# Patient Record
Sex: Female | Born: 1940 | Race: Black or African American | Hispanic: No | Smoking: Never smoker
Health system: Southern US, Community
[De-identification: ages and names within clinical notes are randomized; demographics above are authoritative.]

## PROBLEM LIST (undated history)

## (undated) DIAGNOSIS — E785 Hyperlipidemia, unspecified: Secondary | ICD-10-CM

## (undated) DIAGNOSIS — M1990 Unspecified osteoarthritis, unspecified site: Secondary | ICD-10-CM

## (undated) DIAGNOSIS — T7840XA Allergy, unspecified, initial encounter: Secondary | ICD-10-CM

## (undated) DIAGNOSIS — I10 Essential (primary) hypertension: Secondary | ICD-10-CM

## (undated) HISTORY — DX: Hyperlipidemia, unspecified: E78.5

## (undated) HISTORY — DX: Allergy, unspecified, initial encounter: T78.40XA

## (undated) HISTORY — PX: ABDOMINAL HYSTERECTOMY: SHX81

## (undated) HISTORY — PX: BREAST SURGERY: SHX581

## (undated) HISTORY — PX: EYE SURGERY: SHX253

## (undated) HISTORY — DX: Unspecified osteoarthritis, unspecified site: M19.90

## (undated) HISTORY — PX: BREAST BIOPSY: SHX20

---

## 2015-09-26 ENCOUNTER — Ambulatory Visit (HOSPITAL_BASED_OUTPATIENT_CLINIC_OR_DEPARTMENT_OTHER)
Admission: RE | Admit: 2015-09-26 | Discharge: 2015-09-26 | Disposition: A | Discharge status: Home / Self Care | Payer: Medicare PPO | Source: Ambulatory Visit | Attending: Osteopathic Medicine | Admitting: Osteopathic Medicine

## 2015-09-26 ENCOUNTER — Other Ambulatory Visit (HOSPITAL_BASED_OUTPATIENT_CLINIC_OR_DEPARTMENT_OTHER): Payer: Self-pay | Admitting: Osteopathic Medicine

## 2015-09-26 DIAGNOSIS — M79605 Pain in left leg: Secondary | ICD-10-CM | POA: Diagnosis not present

## 2015-09-26 DIAGNOSIS — I82402 Acute embolism and thrombosis of unspecified deep veins of left lower extremity: Secondary | ICD-10-CM

## 2018-08-24 ENCOUNTER — Emergency Department (HOSPITAL_BASED_OUTPATIENT_CLINIC_OR_DEPARTMENT_OTHER): Payer: Medicare Other

## 2018-08-24 ENCOUNTER — Emergency Department (HOSPITAL_BASED_OUTPATIENT_CLINIC_OR_DEPARTMENT_OTHER)
Admission: EM | Admit: 2018-08-24 | Discharge: 2018-08-24 | Disposition: A | Discharge status: Home / Self Care | Payer: Medicare Other | Attending: Emergency Medicine | Admitting: Emergency Medicine

## 2018-08-24 ENCOUNTER — Encounter (HOSPITAL_BASED_OUTPATIENT_CLINIC_OR_DEPARTMENT_OTHER): Payer: Self-pay | Admitting: Emergency Medicine

## 2018-08-24 ENCOUNTER — Other Ambulatory Visit: Payer: Self-pay

## 2018-08-24 DIAGNOSIS — M1712 Unilateral primary osteoarthritis, left knee: Secondary | ICD-10-CM | POA: Insufficient documentation

## 2018-08-24 DIAGNOSIS — Z79899 Other long term (current) drug therapy: Secondary | ICD-10-CM | POA: Insufficient documentation

## 2018-08-24 DIAGNOSIS — R1013 Epigastric pain: Secondary | ICD-10-CM | POA: Diagnosis present

## 2018-08-24 DIAGNOSIS — I10 Essential (primary) hypertension: Secondary | ICD-10-CM | POA: Diagnosis not present

## 2018-08-24 HISTORY — DX: Essential (primary) hypertension: I10

## 2018-08-24 LAB — COMPREHENSIVE METABOLIC PANEL
ALT: 17 U/L (ref 0–44)
AST: 16 U/L (ref 15–41)
Albumin: 4 g/dL (ref 3.5–5.0)
Alkaline Phosphatase: 49 U/L (ref 38–126)
Anion gap: 12 (ref 5–15)
BUN: 19 mg/dL (ref 8–23)
CALCIUM: 8.9 mg/dL (ref 8.9–10.3)
CO2: 27 mmol/L (ref 22–32)
CREATININE: 0.89 mg/dL (ref 0.44–1.00)
Chloride: 100 mmol/L (ref 98–111)
GFR calc non Af Amer: >60 mL/min (ref >60)
Glucose, Bld: 94 mg/dL (ref 70–99)
Potassium: 3.1 mmol/L — ABNORMAL LOW (ref 3.5–5.1)
Sodium: 139 mmol/L (ref 135–145)
Total Bilirubin: 0.7 mg/dL (ref 0.3–1.2)
Total Protein: 7.4 g/dL (ref 6.5–8.1)

## 2018-08-24 LAB — URINALYSIS, ROUTINE W REFLEX MICROSCOPIC
BILIRUBIN URINE: NEGATIVE
GLUCOSE, UA: NEGATIVE mg/dL
Ketones, ur: NEGATIVE mg/dL
Nitrite: POSITIVE — AB
PROTEIN: NEGATIVE mg/dL
Specific Gravity, Urine: 1.025 (ref 1.005–1.030)
pH: 5.5 (ref 5.0–8.0)

## 2018-08-24 LAB — CBC WITH DIFFERENTIAL/PLATELET
Basophils Absolute: 0 10*3/uL (ref 0.0–0.1)
Basophils Relative: 0 %
EOS PCT: 1 %
Eosinophils Absolute: 0.1 10*3/uL (ref 0.0–0.7)
HCT: 41 % (ref 36.0–46.0)
Hemoglobin: 14 g/dL (ref 12.0–15.0)
Lymphocytes Relative: 15 %
Lymphs Abs: 0.9 10*3/uL (ref 0.7–4.0)
MCH: 30.2 pg (ref 26.0–34.0)
MCHC: 34.1 g/dL (ref 30.0–36.0)
MCV: 88.4 fL (ref 78.0–100.0)
MONOS PCT: 7 %
Monocytes Absolute: 0.5 10*3/uL (ref 0.1–1.0)
Neutro Abs: 4.7 10*3/uL (ref 1.7–7.7)
Neutrophils Relative %: 77 %
PLATELETS: 234 10*3/uL (ref 150–400)
RBC: 4.64 MIL/uL (ref 3.87–5.11)
RDW: 13.5 % (ref 11.5–15.5)
WBC: 6.1 10*3/uL (ref 4.0–10.5)

## 2018-08-24 LAB — TROPONIN I: Troponin I: <0.03 ng/mL (ref <0.03)

## 2018-08-24 LAB — URINALYSIS, MICROSCOPIC (REFLEX)

## 2018-08-24 LAB — LIPASE, BLOOD: LIPASE: 26 U/L (ref 11–51)

## 2018-08-24 MED ORDER — SODIUM CHLORIDE 0.9 % IV SOLN: INTRAVENOUS | Status: DC | PRN

## 2018-08-24 MED ORDER — OMEPRAZOLE 20 MG PO CPDR: 20.0000 mg | DELAYED_RELEASE_CAPSULE | Freq: Every day | ORAL | 0 refills | Status: DC

## 2018-08-24 MED ORDER — ACETAMINOPHEN ER 650 MG PO TBCR: 650.0000 mg | EXTENDED_RELEASE_TABLET | Freq: Three times a day (TID) | ORAL | 0 refills | Status: DC

## 2018-08-24 MED ORDER — IOPAMIDOL (ISOVUE-300) INJECTION 61%
100.0000 mL | Freq: Once | INTRAVENOUS | Status: AC | PRN
  Administered 2018-08-24: 100 mL via INTRAVENOUS

## 2018-08-24 MED ORDER — FAMOTIDINE IN NACL 20-0.9 MG/50ML-% IV SOLN
20.0000 mg | Freq: Once | INTRAVENOUS | Status: AC
  Administered 2018-08-24: 20 mg via INTRAVENOUS
  Filled 2018-08-24: qty 50

## 2018-08-24 MED ORDER — POTASSIUM CHLORIDE CRYS ER 20 MEQ PO TBCR
40.0000 meq | EXTENDED_RELEASE_TABLET | Freq: Once | ORAL | Status: AC
  Administered 2018-08-24: 40 meq via ORAL
  Filled 2018-08-24: qty 2

## 2018-08-24 MED ORDER — SODIUM CHLORIDE 0.9 % IV SOLN
INTRAVENOUS | Status: DC | PRN
  Administered 2018-08-24: 18:00:00 via INTRAVENOUS

## 2018-08-24 NOTE — ED Provider Notes (Signed)
[facility] EMERGENCY DEPARTMENT Provider Note   CSN: [id number] Arrival date & time: 08/24/18  1710     History   Chief Complaint Chief Complaint  Patient presents with  . Abdominal Pain    HPI Faith Webb is a 77 y.o. female with a past medical history of hypertension presents to ED for intermittent epigastric pain since last night.  States that symptoms began after she ate some fish and coleslaw that was sitting outside in the heat.  She had no improvement with 1 dose of Pepto-Bismol.  No history of similar symptoms in the past.  She denies any chest pain or shortness of breath but states that "I do not know if it is my heart, it could be."  No history of MI or PE in the past.  Patient has history of reflux.  She has had a decreased appetite today.  Denies any nausea, vomiting, changes to bowel movements, fever. She also reports 61-month history of intermittent left knee pain.  States that pain is worse after walking long distances.  Denies any injuries or falls, numbness in legs or leg swelling.  HPI  Past Medical History:  Diagnosis Date  . Hypertension     There are no active problems to display for this patient.   Past Surgical History:  Procedure Laterality Date  . ABDOMINAL HYSTERECTOMY       OB History   None      Home Medications    Prior to Admission medications   Medication Sig Start Date End Date Taking? Authorizing Provider  hydrochlorothiazide (HYDRODIURIL) 25 MG tablet Take 25 mg by mouth daily.   Yes [provider]  POTASSIUM CHLORIDE PO Take by mouth.   Yes [provider]    Family History History reviewed. No pertinent family history.  Social History Social History   Tobacco Use  . Smoking status: Never Smoker  . Smokeless tobacco: Never Used  Substance Use Topics  . Alcohol use: Never    Frequency: Never  . Drug use: Never     Allergies   Patient has no known allergies.   Review of Systems Review  of Systems  Constitutional: Negative for appetite change, chills and fever.  HENT: Negative for ear pain, rhinorrhea, sneezing and sore throat.   Eyes: Negative for photophobia and visual disturbance.  Respiratory: Negative for cough, chest tightness, shortness of breath and wheezing.   Cardiovascular: Negative for chest pain and palpitations.  Gastrointestinal: Positive for abdominal pain. Negative for blood in stool, constipation, diarrhea, nausea and vomiting.  Genitourinary: Negative for dysuria, hematuria and urgency.  Musculoskeletal: Positive for arthralgias. Negative for myalgias.  Skin: Negative for rash.  Neurological: Negative for dizziness, weakness and light-headedness.     Physical Exam Updated Vital Signs BP (!) 109/95   Pulse 87   Temp 99.2 F (37.3 C) (Oral)   Resp (!) 21   Ht 5\' 1"  (1.549 m)   Wt 65.3 kg   SpO2 98%   BMI 27.21 kg/m   Physical Exam  Constitutional: She appears well-developed and well-nourished. No distress.  HENT:  Head: Normocephalic and atraumatic.  Nose: Nose normal.  Eyes: Conjunctivae and EOM are normal. Left eye exhibits no discharge. No scleral icterus.  Neck: Normal range of motion. Neck supple.  Cardiovascular: Normal rate, regular rhythm, normal heart sounds and intact distal pulses. Exam reveals no gallop and no friction rub.  No murmur heard. Pulmonary/Chest: Effort normal and breath sounds normal. No respiratory distress.  Abdominal: Soft. Bowel sounds are normal. She exhibits no distension. There is tenderness in the epigastric area. There is no guarding.  Musculoskeletal: Normal range of motion. She exhibits tenderness. She exhibits no edema.  Tenderness to palpation of the left medial knee with no changes to range of motion noted.  No erythema, edema or warmth of joint noted.  Neurological: She is alert. She exhibits normal muscle tone. Coordination normal.  Skin: Skin is warm and dry. No rash noted.  Psychiatric: She has a  normal mood and affect.  Nursing note and vitals reviewed.    ED Treatments / Results  Labs (all labs ordered are listed, but only abnormal results are displayed) Labs Reviewed  COMPREHENSIVE METABOLIC PANEL - Abnormal; Notable for the following components:      Result Value   Potassium 3.1 (*)    All other components within normal limits  URINALYSIS, ROUTINE W REFLEX MICROSCOPIC - Abnormal; Notable for the following components:   APPearance CLOUDY (*)    Hgb urine dipstick SMALL (*)    Nitrite POSITIVE (*)    Leukocytes, UA TRACE (*)    All other components within normal limits  URINALYSIS, MICROSCOPIC (REFLEX) - Abnormal; Notable for the following components:   Bacteria, UA MANY (*)    All other components within normal limits  URINE CULTURE  LIPASE, BLOOD  CBC WITH DIFFERENTIAL/PLATELET  TROPONIN I    EKG EKG Interpretation  Date/Time:  Sunday August 24 2018 18:13:29 EDT Ventricular Rate:  92 PR Interval:    QRS Duration: 87 QT Interval:  348 QTC Calculation: 431 R Axis:   -7 Text Interpretation:  Sinus rhythm Right atrial enlargement Low voltage, precordial leads No acute changes No old tracing to compare Confirmed by Nanavati, Ankit ([id number]) on 08/24/2018 6:25:14 PM   Radiology Dg Chest 2 View  Result Date: 08/24/2018 CLINICAL DATA:  Epigastric pain EXAM: CHEST - 2 VIEW COMPARISON:  None. FINDINGS: No focal opacity or pleural effusion. Heart size upper limits of normal. No pneumothorax. IMPRESSION: No active cardiopulmonary disease. Electronically Signed   By: Kim  Fujinaga M.D.   On: 08/24/2018 19:23   Dg Knee Complete 4 Views Left  Result Date: 08/24/2018 CLINICAL DATA:  Knee pain for several months EXAM: LEFT KNEE - COMPLETE 4+ VIEW COMPARISON:  None. FINDINGS: No fracture or malalignment. Moderate arthritis involving the medial compartment. Mild patellofemoral degenerative change. No large knee effusion. IMPRESSION: Moderate arthritis of the knee.  No acute  osseous abnormality. Electronically Signed   By: Kim  Fujinaga M.D.   On: 08/24/2018 19:24    Procedures Procedures (including critical care time)  Medications Ordered in ED Medications  0.9 %  sodium chloride infusion ( Intravenous New Bag/Given 08/24/18 1814)  potassium chloride SA (K-DUR,KLOR-CON) CR tablet 40 mEq (has no administration in time range)  famotidine (PEPCID) IVPB 20 mg premix (20 mg Intravenous New Bag/Given 08/24/18 1816)     Initial Impression / Assessment and Plan / ED Course  I have reviewed the triage vital signs and the nursing notes.  Pertinent labs & imaging results that were available during my care of the patient were reviewed by me and considered in my medical decision making (see chart for details).     77  year old female with a past medical history of hypertension presents to ED for intermittent epigastric pain since last night.  Unsure if this is related to the fish and coleslaw that she ate for dinner.  No improvement with Pepto-Bismol.  No history of  similar symptoms in the past.  She denies any nausea, vomiting, shortness of breath, hemoptysis, prior MI or PE.  She also reports ongoing left knee pain for the past several months which is worse with walking long distances.  On exam there is no abdominal tenderness to palpation.  No lower extremity edema, erythema or calf tenderness bilaterally.  No changes to range of motion of the knee or overlying skin changes.  She is not tachycardic, tachypneic or hypoxic.  Lab work significant for mild hypokalemia at 3.1, lipase, CBC, troponin unremarkable.  Urinalysis positive nitrites, leukocytes and bacteria.  Patient denies any urinary symptoms.  EKG shows sinus rhythm with no acute changes.  Chest x-ray is unremarkable.  X-ray of the knee shows osteoarthritis with no other concerning abnormalities. Will obtain CT of the abdomen pelvis and reassess. Care handed off to oncoming provider pending reassessment and  imaging.  Portions of this note were generated with Scientist, clinical (histocompatibility and immunogenetics). Dictation errors may occur despite best attempts at proofreading.   Final Clinical Impressions(s) / ED Diagnoses   Final diagnoses:  None    ED Discharge Orders    None       Dietrich Pates, PA-C 08/24/18 2018    Derwood Kaplan, MD 08/25/18 0004

## 2018-08-24 NOTE — ED Triage Notes (Signed)
Reports intermittent epigastric pain since last night without N/V/D.  Additionally reports left knee pain which has been bothering her for several months and states she would like this to be evaluated today.

## 2018-08-24 NOTE — Discharge Instructions (Addendum)
The x-ray of your knee showed osteoarthritis with no other concerning findings. Lab work today was reassuring.  Chest x-ray showed no concerning findings and the CT scan is also normal. Follow-up with your primary care provider. Return to ED for worsening symptoms, chest pain, shortness of breath, vomiting or coughing up blood, fever, lightheadedness or loss of consciousness.

## 2018-08-28 LAB — URINE CULTURE: Culture: >=100000 — AB

## 2018-08-29 ENCOUNTER — Telehealth: Payer: Self-pay | Admitting: Emergency Medicine

## 2018-08-29 NOTE — Progress Notes (Signed)
ED Antimicrobial Stewardship Positive Culture Follow Up   Faith Webb is an 77 y.o. female who presented to [facility] on 08/24/2018 with a chief complaint of  Chief Complaint  Patient presents with  . Abdominal Pain    Recent Results (from the past 720 hour(s))  Urine culture     Status: Abnormal   Collection Time: 08/24/18  6:10 PM  Result Value Ref Range Status   Specimen Description   Final    URINE, CLEAN CATCH Performed at [facility], [address] [place], [place] [address]    Special Requests   Final    NONE Performed at [facility], [address] [place], [place] [address]    Culture >=100,000 COLONIES/mL ESCHERICHIA COLI (A)  Final   Report Status 08/28/2018 FINAL  Final   Organism ID, Bacteria ESCHERICHIA COLI (A)  Final      Susceptibility   Escherichia coli - MIC*    AMPICILLIN <=2 SENSITIVE Sensitive     CEFAZOLIN <=4 SENSITIVE Sensitive     CEFTRIAXONE <=1 SENSITIVE Sensitive     CIPROFLOXACIN <=0.25 SENSITIVE Sensitive     GENTAMICIN <=1 SENSITIVE Sensitive     IMIPENEM <=0.25 SENSITIVE Sensitive     NITROFURANTOIN <=16 SENSITIVE Sensitive     TRIMETH/SULFA <=20 SENSITIVE Sensitive     AMPICILLIN/SULBACTAM <=2 SENSITIVE Sensitive     PIP/TAZO <=4 SENSITIVE Sensitive     Extended ESBL NEGATIVE Sensitive     * >=100,000 COLONIES/mL ESCHERICHIA COLI    No treatment necessary, clinical presentation consistent with asymptomatic bacteriuria.   ED Provider: Ralph Leyden    Lyanne Co 08/29/2018, 9:42 AM PharmD Candidate Monday - Friday phone -  [phone] Saturday - Sunday phone - [phone]

## 2018-08-29 NOTE — Telephone Encounter (Signed)
Post ED Visit - Positive Culture Follow-up  Culture report reviewed by antimicrobial stewardship pharmacist:  []  Enzo Bi, Pharm.D. []  Celedonio Miyamoto, Pharm.D., BCPS AQ-ID []  Garvin Fila, Pharm.D., BCPS []  Georgina Pillion, [address].D., BCPS []  [place], [address].D., BCPS, AAHIVP []  Estella Husk, Pharm.D., BCPS, AAHIVP []  Lysle Pearl, PharmD, BCPS []  Phillips Climes, PharmD, BCPS []  Agapito Games, PharmD, BCPS [x]  Lyanne Co, PharmD  Positive Urine culture No further patient follow-up is required at this time.  Delvis Kau 08/29/2018, 10:10 AM

## 2019-05-07 ENCOUNTER — Telehealth: Payer: Self-pay

## 2019-05-07 NOTE — Telephone Encounter (Signed)
Copied from [place] [phone]. Topic: Quick Communication - See Telephone Encounter >> May 07, 2019  9:47 AM Loma Boston wrote: CRM for notification. See Telephone encounter for: 05/07/19.PT called in for a new appt with Dr Etter Sjogren, front desk verified her pt panel was full, upon returning to patient she then said that her dr., Dr Liston Alba had called Dr Etter Sjogren and asked her to take  as pt and Lowne stated she would. There are no notes or records in chart to verify this but if pt is to be contacted she can be reached at [phone]. She states that it must be asap.

## 2019-05-07 NOTE — Telephone Encounter (Signed)
I just spoke to her this am--- yes I agreed to it--- she just beat me to you all

## 2019-05-07 NOTE — Telephone Encounter (Signed)
Okay to schedule NP appt w/ Dr. Etter Sjogren.

## 2019-05-08 ENCOUNTER — Other Ambulatory Visit: Payer: Self-pay

## 2019-05-08 ENCOUNTER — Ambulatory Visit (HOSPITAL_BASED_OUTPATIENT_CLINIC_OR_DEPARTMENT_OTHER)
Admission: RE | Admit: 2019-05-08 | Discharge: 2019-05-08 | Disposition: A | Discharge status: Home / Self Care | Payer: Medicare Other | Source: Ambulatory Visit | Attending: Family Medicine | Admitting: Family Medicine

## 2019-05-08 ENCOUNTER — Encounter: Payer: Self-pay | Admitting: Family Medicine

## 2019-05-08 ENCOUNTER — Ambulatory Visit: Payer: Medicare Other | Admitting: Family Medicine

## 2019-05-08 VITALS — BP 128/67 | HR 96 | Temp 98.3°F | Resp 18 | Wt 139.4 lb

## 2019-05-08 DIAGNOSIS — I10 Essential (primary) hypertension: Secondary | ICD-10-CM

## 2019-05-08 DIAGNOSIS — R0602 Shortness of breath: Secondary | ICD-10-CM

## 2019-05-08 DIAGNOSIS — R002 Palpitations: Secondary | ICD-10-CM

## 2019-05-08 DIAGNOSIS — R42 Dizziness and giddiness: Secondary | ICD-10-CM | POA: Diagnosis not present

## 2019-05-08 DIAGNOSIS — E785 Hyperlipidemia, unspecified: Secondary | ICD-10-CM

## 2019-05-08 NOTE — Telephone Encounter (Signed)
NP appt scheduled

## 2019-05-08 NOTE — Progress Notes (Signed)
Patient ID: Faith Webb, female    DOB: 06/04/1941  Age: 78 y.o. MRN: [id number]    Subjective:  Subjective  HPI Faith Webb presents to establish care    She has been having trouble with weakness , dizziness and palpitations.  She recently had a holter but does not know the results.  She would like to [facility] all [facility] care to [facility].   No chest pain.  Some sob with exertion.   Review of Systems  Constitutional: Negative for appetite change, diaphoresis, fatigue and unexpected weight change.  Eyes: Negative for pain, redness and visual disturbance.  Respiratory: Positive for shortness of breath. Negative for cough, chest tightness and wheezing.   Cardiovascular: Positive for palpitations. Negative for chest pain and leg swelling.  Endocrine: Negative for cold intolerance, heat intolerance, polydipsia, polyphagia and polyuria.  Genitourinary: Negative for difficulty urinating, dysuria and frequency.  Neurological: Positive for dizziness, weakness and light-headedness. Negative for numbness and headaches.    History Past Medical History:  Diagnosis Date  . Allergy   . Arthritis   . Hyperlipidemia   . Hypertension     She has a past surgical history that includes Abdominal hysterectomy.   Her family history includes Cancer in her sister; Diabetes in her brother, father, mother, sister, sister, and sister; Heart disease in her mother; Hyperlipidemia in her brother, sister, and sister; Hypertension in her mother and sister; Kidney disease in her sister; Stroke in her brother, mother, and sister.She reports that she has never smoked. She has never used smokeless tobacco. She reports that she does not drink alcohol or use drugs.  Current Outpatient Medications on File Prior to Visit  Medication Sig Dispense Refill  . hydrochlorothiazide (HYDRODIURIL) 25 MG tablet Take 25 mg by mouth daily.    . hydrOXYzine (VISTARIL) 25 MG capsule Take 25 mg by mouth at bedtime.    . metoprolol tartrate  (LOPRESSOR) 25 MG tablet Take 25 mg by mouth 2 (two) times daily.    Marland Kitchen POTASSIUM CHLORIDE PO Take by mouth.    Marland Kitchen acetaminophen (TYLENOL 8 HOUR) 650 MG CR tablet Take 1 tablet (650 mg total) by mouth every 8 (eight) hours. (Patient not taking: Reported on 05/08/2019) 30 tablet 0  . omeprazole (PRILOSEC) 20 MG capsule Take 1 capsule (20 mg total) by mouth daily. (Patient not taking: Reported on 05/08/2019) 30 capsule 0   No current facility-administered medications on file prior to visit.      Objective:  Objective  Physical Exam Vitals signs and nursing note reviewed.  Constitutional:      Appearance: She is well-developed.  HENT:     Head: Normocephalic and atraumatic.  Eyes:     Conjunctiva/sclera: Conjunctivae normal.  Neck:     Musculoskeletal: Normal range of motion and neck supple.     Thyroid: No thyromegaly.     Vascular: No carotid bruit or JVD.  Cardiovascular:     Rate and Rhythm: Normal rate and regular rhythm.     Heart sounds: Normal heart sounds. No murmur.  Pulmonary:     Effort: Pulmonary effort is normal. No respiratory distress.     Breath sounds: Normal breath sounds. No wheezing or rales.  Chest:     Chest wall: No tenderness.  Neurological:     General: No focal deficit present.     Mental Status: She is alert and oriented to person, place, and time.     Motor: No weakness.     Gait: Gait  normal.  Psychiatric:        Mood and Affect: Mood normal.        Behavior: Behavior normal.        Thought Content: Thought content normal.        Judgment: Judgment normal.    BP 128/67 (BP Location: Left Arm, Patient Position: Sitting, Cuff Size: Normal)   Pulse 96   Temp 98.3 F (36.8 C) (Oral)   Resp 18   Wt 139 lb 6.4 oz (63.2 kg)   SpO2 99%   BMI 26.34 kg/m  Wt Readings from Last 3 Encounters:  05/08/19 139 lb 6.4 oz (63.2 kg)  08/24/18 144 lb (65.3 kg)   ekg from ems--  nsr   Lab Results  Component Value Date   WBC 5.4 05/08/2019   HGB 13.8  05/08/2019   HCT 40.0 05/08/2019   PLT 304 05/08/2019   GLUCOSE 106 (H) 05/08/2019   CHOL 212 (H) 05/08/2019   TRIG 140 05/08/2019   HDL 62 05/08/2019   LDLCALC 125 (H) 05/08/2019   ALT 13 05/08/2019   AST 14 05/08/2019   NA 140 05/08/2019   K 3.4 (L) 05/08/2019   CL 100 05/08/2019   CREATININE 0.99 (H) 05/08/2019   BUN 19 05/08/2019   CO2 27 05/08/2019   TSH 0.31 (L) 05/08/2019    Dg Chest 2 View  Result Date: 08/24/2018 CLINICAL DATA:  Epigastric pain EXAM: CHEST - 2 VIEW COMPARISON:  None. FINDINGS: No focal opacity or pleural effusion. Heart size upper limits of normal. No pneumothorax. IMPRESSION: No active cardiopulmonary disease. Electronically Signed   By: Jasmine PangKim  Fujinaga M.D.   On: 08/24/2018 19:23   Ct Abdomen Pelvis W Contrast  Result Date: 08/24/2018 CLINICAL DATA:  Intermittent abdominal pain EXAM: CT ABDOMEN AND PELVIS WITH CONTRAST TECHNIQUE: Multidetector CT imaging of the abdomen and pelvis was performed using the standard protocol following bolus administration of intravenous contrast. CONTRAST:  100mL ISOVUE-300 IOPAMIDOL (ISOVUE-300) INJECTION 61% COMPARISON:  None. FINDINGS: Lower chest: Lung bases are clear. Hepatobiliary: Scattered sub 5 mm cysts. Gallbladder is unremarkable. No intrahepatic or extrahepatic ductal dilatation. Pancreas: Within normal limits normal limits. Spleen: Within Adrenals/Urinary Tract: Adrenal glands are within normal limits. 3.9 cm right upper pole renal cyst. Left kidney is within normal limits. No hydronephrosis. Bladder is within normal limits. Stomach/Bowel: Stomach is within normal limits. No evidence of bowel obstruction. Normal appendix (series 2/image 50). Vascular/Lymphatic: No evidence of abdominal aortic aneurysm. Atherosclerotic calcifications of the abdominal aorta and branch vessels. No suspicious abdominopelvic lymphadenopathy. Reproductive: Status post hysterectomy. Bilateral ovaries are within normal limits. Other: No  abdominopelvic ascites. Tiny fat containing left inguinal hernia. Musculoskeletal: Mild degenerative changes at L4-5. IMPRESSION: No evidence of bowel obstruction.  Normal appendix. 3.9 cm right upper pole renal cyst, benign (Bosniak I). Status post hysterectomy. No CT findings to account for the patient's intermittent abdominal pain. Electronically Signed   By: Charline BillsSriyesh  Krishnan M.D.   On: 08/24/2018 21:10   Dg Knee Complete 4 Views Left  Result Date: 08/24/2018 CLINICAL DATA:  Knee pain for several months EXAM: LEFT KNEE - COMPLETE 4+ VIEW COMPARISON:  None. FINDINGS: No fracture or malalignment. Moderate arthritis involving the medial compartment. Mild patellofemoral degenerative change. No large knee effusion. IMPRESSION: Moderate arthritis of the knee.  No acute osseous abnormality. Electronically Signed   By: Jasmine PangKim  Fujinaga M.D.   On: 08/24/2018 19:24     Assessment & Plan:  Plan  I am having Stacy L.  Mccoy maintain her hydrochlorothiazide, POTASSIUM CHLORIDE PO, omeprazole, acetaminophen, metoprolol tartrate, and hydrOXYzine.  No orders of the defined types were placed in this encounter.   Problem List Items Addressed This Visit      Unprioritized   Dizziness    Check labs Need records from previous pcp and holter montior      Relevant Orders   Vitamin B12 (Completed)   Vitamin D 1,25 dihydroxy   TSH (Completed)   CBC with Differential/Platelet (Completed)   Lipid panel (Completed)   Comprehensive metabolic panel (Completed)   Ambulatory referral to Cardiology   Hyperlipidemia   Relevant Medications   metoprolol tartrate (LOPRESSOR) 25 MG tablet   Hypertension   Relevant Medications   metoprolol tartrate (LOPRESSOR) 25 MG tablet   Palpitations - Primary    Check labs Will get records from previous pcp Refer to cardiology      Relevant Orders   ECHOCARDIOGRAM COMPLETE   Vitamin B12 (Completed)   Vitamin D 1,25 dihydroxy   TSH (Completed)   CBC with  Differential/Platelet (Completed)   Lipid panel (Completed)   Comprehensive metabolic panel (Completed)   DG Chest 2 View (Completed)   Ambulatory referral to Cardiology    Other Visit Diagnoses    SOB (shortness of breath)       Relevant Orders   DG Chest 2 View (Completed)   Ambulatory referral to Cardiology      Follow-up: Return in about 3 months (around 08/08/2019).  Donato SchultzYvonne R Lowne Chase, DO

## 2019-05-09 ENCOUNTER — Other Ambulatory Visit: Payer: Self-pay | Admitting: Family Medicine

## 2019-05-09 DIAGNOSIS — R42 Dizziness and giddiness: Secondary | ICD-10-CM | POA: Insufficient documentation

## 2019-05-09 DIAGNOSIS — R002 Palpitations: Secondary | ICD-10-CM | POA: Insufficient documentation

## 2019-05-09 DIAGNOSIS — E785 Hyperlipidemia, unspecified: Secondary | ICD-10-CM | POA: Insufficient documentation

## 2019-05-09 DIAGNOSIS — E0590 Thyrotoxicosis, unspecified without thyrotoxic crisis or storm: Secondary | ICD-10-CM

## 2019-05-09 DIAGNOSIS — I10 Essential (primary) hypertension: Secondary | ICD-10-CM | POA: Insufficient documentation

## 2019-05-09 NOTE — Assessment & Plan Note (Signed)
Check labs Will get records from previous pcp Refer to cardiology

## 2019-05-09 NOTE — Assessment & Plan Note (Signed)
Check labs Need records from previous pcp and holter montior

## 2019-05-11 ENCOUNTER — Emergency Department (HOSPITAL_BASED_OUTPATIENT_CLINIC_OR_DEPARTMENT_OTHER)
Admission: EM | Admit: 2019-05-11 | Discharge: 2019-05-11 | Disposition: A | Discharge status: Home / Self Care | Payer: Medicare Other | Attending: Emergency Medicine | Admitting: Emergency Medicine

## 2019-05-11 ENCOUNTER — Other Ambulatory Visit: Payer: Self-pay

## 2019-05-11 ENCOUNTER — Encounter (HOSPITAL_BASED_OUTPATIENT_CLINIC_OR_DEPARTMENT_OTHER): Payer: Self-pay | Admitting: Emergency Medicine

## 2019-05-11 DIAGNOSIS — R1013 Epigastric pain: Secondary | ICD-10-CM

## 2019-05-11 DIAGNOSIS — I10 Essential (primary) hypertension: Secondary | ICD-10-CM | POA: Diagnosis not present

## 2019-05-11 DIAGNOSIS — R42 Dizziness and giddiness: Secondary | ICD-10-CM

## 2019-05-11 DIAGNOSIS — Z79899 Other long term (current) drug therapy: Secondary | ICD-10-CM | POA: Diagnosis not present

## 2019-05-11 DIAGNOSIS — R5383 Other fatigue: Secondary | ICD-10-CM | POA: Diagnosis not present

## 2019-05-11 LAB — CBC WITH DIFFERENTIAL/PLATELET
Absolute Monocytes: 589 cells/uL (ref 200–950)
Basophils Absolute: 49 cells/uL (ref 0–200)
Basophils Relative: 0.9 %
Eosinophils Absolute: 81 cells/uL (ref 15–500)
Eosinophils Relative: 1.5 %
HCT: 40 % (ref 35.0–45.0)
Hemoglobin: 13.8 g/dL (ref 11.7–15.5)
Lymphs Abs: 2090 cells/uL (ref 850–3900)
MCH: 30.1 pg (ref 27.0–33.0)
MCHC: 34.5 g/dL (ref 32.0–36.0)
MCV: 87.3 fL (ref 80.0–100.0)
MPV: 10.3 fL (ref 7.5–12.5)
Monocytes Relative: 10.9 %
Neutro Abs: 2592 cells/uL (ref 1500–7800)
Neutrophils Relative %: 48 %
Platelets: 304 10*3/uL (ref 140–400)
RBC: 4.58 10*6/uL (ref 3.80–5.10)
RDW: 13.6 % (ref 11.0–15.0)
Total Lymphocyte: 38.7 %
WBC: 5.4 10*3/uL (ref 3.8–10.8)

## 2019-05-11 LAB — LIPID PANEL
Cholesterol: 212 mg/dL — ABNORMAL HIGH (ref <200)
HDL: 62 mg/dL (ref >=50)
LDL Cholesterol (Calc): 125 mg/dL (calc) — ABNORMAL HIGH
Non-HDL Cholesterol (Calc): 150 mg/dL (calc) — ABNORMAL HIGH (ref <130)
Total CHOL/HDL Ratio: 3.4 (calc) (ref <5.0)
Triglycerides: 140 mg/dL (ref <150)

## 2019-05-11 LAB — URINALYSIS, ROUTINE W REFLEX MICROSCOPIC
Bilirubin Urine: NEGATIVE
Glucose, UA: NEGATIVE mg/dL
Ketones, ur: NEGATIVE mg/dL
Leukocytes,Ua: NEGATIVE
Nitrite: NEGATIVE
Protein, ur: NEGATIVE mg/dL
Specific Gravity, Urine: <1.005 — ABNORMAL LOW (ref 1.005–1.030)
pH: 6 (ref 5.0–8.0)

## 2019-05-11 LAB — VITAMIN B12: Vitamin B-12: 799 pg/mL (ref 200–1100)

## 2019-05-11 LAB — COMPREHENSIVE METABOLIC PANEL
AG Ratio: 1.7 (calc) (ref 1.0–2.5)
ALT: 13 U/L (ref 6–29)
AST: 14 U/L (ref 10–35)
Albumin: 4.4 g/dL (ref 3.6–5.1)
Alkaline phosphatase (APISO): 49 U/L (ref 37–153)
BUN/Creatinine Ratio: 19 (calc) (ref 6–22)
BUN: 19 mg/dL (ref 7–25)
CO2: 27 mmol/L (ref 20–32)
Calcium: 9.7 mg/dL (ref 8.6–10.4)
Chloride: 100 mmol/L (ref 98–110)
Creat: 0.99 mg/dL — ABNORMAL HIGH (ref 0.60–0.93)
Globulin: 2.6 g/dL (calc) (ref 1.9–3.7)
Glucose, Bld: 106 mg/dL — ABNORMAL HIGH (ref 65–99)
Potassium: 3.4 mmol/L — ABNORMAL LOW (ref 3.5–5.3)
Sodium: 140 mmol/L (ref 135–146)
Total Bilirubin: 0.4 mg/dL (ref 0.2–1.2)
Total Protein: 7 g/dL (ref 6.1–8.1)

## 2019-05-11 LAB — VITAMIN D 1,25 DIHYDROXY
Vitamin D 1, 25 (OH)2 Total: 49 pg/mL (ref 18–72)
Vitamin D2 1, 25 (OH)2: 39 pg/mL
Vitamin D3 1, 25 (OH)2: 10 pg/mL

## 2019-05-11 LAB — URINALYSIS, MICROSCOPIC (REFLEX): WBC, UA: NONE SEEN WBC/hpf (ref 0–5)

## 2019-05-11 LAB — TSH: TSH: 0.31 mIU/L — ABNORMAL LOW (ref 0.40–4.50)

## 2019-05-11 MED ORDER — PANTOPRAZOLE SODIUM 40 MG PO TBEC
40.0000 mg | DELAYED_RELEASE_TABLET | Freq: Every day | ORAL | Status: DC
  Administered 2019-05-11: 40 mg via ORAL
  Filled 2019-05-11: qty 1

## 2019-05-11 MED ORDER — OMEPRAZOLE 20 MG PO CPDR: 20.0000 mg | DELAYED_RELEASE_CAPSULE | Freq: Every day | ORAL | 0 refills | Status: DC

## 2019-05-11 MED ORDER — MECLIZINE HCL 12.5 MG PO TABS: 12.5000 mg | ORAL_TABLET | Freq: Three times a day (TID) | ORAL | 0 refills | Status: DC | PRN

## 2019-05-11 NOTE — ED Notes (Signed)
Pt. Daughter is Robinette Haines  Phone is [phone]

## 2019-05-11 NOTE — ED Notes (Signed)
ED Provider at bedside discussing dispo plan of care. 

## 2019-05-11 NOTE — ED Provider Notes (Signed)
[facility] EMERGENCY DEPARTMENT Provider Note   CSN: [id number] Arrival date & time: 05/11/19  1347    History   Chief Complaint Chief Complaint  Patient presents with  . Dizziness and fatigue    HPI Faith Webb is a 78 y.o. female.     HPI Patient reports she has had symptoms for about 4 months.  Reports she is seen several different providers but does not feel like her symptoms are improving.  Reports out of all evaluation, she has never been giving any medication to try to relieve symptoms.  Patient reports one symptom is dizziness.  She reports she will feel lightheaded and fatigued.  She reports the symptoms are often worse in the morning.  Symptoms may wax and wane.  They do seem to be worse with movements and position changes.  He denies that she has a headache or any visual changes.  No weakness numbness or tingling of extremities.  This perception of dizziness does not have any associated chest pain or shortness of breath.  She reports that she also for a number of months has felt she has some epigastric discomfort.  It is a aching pressure discomfort.  Sometimes worse with eating.  No vomiting or diarrhea.  No pain burning urgency with urination.  Patient has been seen by Dr. Laury AxonLowne this week and had lab studies done and has been referred for echo and cardiology follow-up.  Recommendation Dr. Ernst SpellLowne's note indicates she is to continue her omeprazole.  Patient reports she is not taking omeprazole and that it was not suggested to her that she should take it.  The best of what I can discern by the conversation with the patient she is not taking any PPI or acid medication.  She reports she is also having a lot of trouble sleeping at night.  She reports she feels very restless and cannot sleep. Past Medical History:  Diagnosis Date  . Allergy   . Arthritis   . Hyperlipidemia   . Hypertension     Patient Active Problem List   Diagnosis Date Noted  . Palpitations  05/09/2019  . Dizziness 05/09/2019  . Hypertension   . Hyperlipidemia     Past Surgical History:  Procedure Laterality Date  . ABDOMINAL HYSTERECTOMY    . BREAST SURGERY       OB History   No obstetric history on file.      Home Medications    Prior to Admission medications   Medication Sig Start Date End Date Taking? Authorizing Provider  Vitamin D, Ergocalciferol, (DRISDOL) 1.25 MG (50000 UT) CAPS capsule TAKE 1 CAPSULE BY MOUTH EVERY OTHER WEEK 07/02/17  Yes [provider]  hydrochlorothiazide (HYDRODIURIL) 25 MG tablet Take 25 mg by mouth daily.    [provider]  hydrOXYzine (VISTARIL) 25 MG capsule Take 25 mg by mouth at bedtime.    [provider]  Magnesium Oxide 420 MG TABS Take by mouth.    [provider]  meclizine (ANTIVERT) 12.5 MG tablet Take 1 tablet (12.5 mg total) by mouth 3 (three) times daily as needed for dizziness. 05/11/19   Arby BarrettePfeiffer, Rhiley Tarver, MD  metoprolol tartrate (LOPRESSOR) 25 MG tablet Take 25 mg by mouth 2 (two) times daily.    [provider]  Omega-3 Fatty Acids (THE VERY FINEST FISH OIL) LIQD Take by mouth.    [provider]  omeprazole (PRILOSEC) 20 MG capsule Take 1 capsule (20 mg total) by mouth daily. 05/11/19  Charlesetta Shanks, MD  POTASSIUM CHLORIDE PO Take by mouth.    [provider]    Family History Family History  Problem Relation Age of Onset  . Diabetes Mother   . Heart disease Mother   . Hypertension Mother   . Stroke Mother   . Diabetes Father   . Cancer Sister   . Diabetes Sister   . Diabetes Brother   . Hyperlipidemia Brother   . Stroke Brother   . Diabetes Sister   . Hyperlipidemia Sister   . Hypertension Sister   . Kidney disease Sister   . Stroke Sister   . Diabetes Sister   . Hyperlipidemia Sister     Social History Social History   Tobacco Use  . Smoking status: Never Smoker  . Smokeless tobacco: Never Used  Substance Use Topics  . Alcohol  use: Never    Frequency: Never  . Drug use: Never     Allergies   Patient has no known allergies.   Review of Systems Review of Systems 10 Systems reviewed and are negative for acute change except as noted in the HPI.   Physical Exam Updated Vital Signs BP (!) 159/66 (BP Location: Right Arm)   Pulse 78   Temp 98.6 F (37 C) (Oral)   Resp 17   Ht 5' 1.5" (1.562 m)   Wt 60.8 kg   SpO2 100%   BMI 24.91 kg/m   Physical Exam Constitutional:      Appearance: She is well-developed.  HENT:     Head: Normocephalic and atraumatic.     Right Ear: Tympanic membrane normal.     Left Ear: Tympanic membrane normal.     Mouth/Throat:     Mouth: Mucous membranes are moist.     Pharynx: Oropharynx is clear.  Eyes:     Extraocular Movements: Extraocular movements intact.     Pupils: Pupils are equal, round, and reactive to light.     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  (She reports this has been treated and was due to be retreated just when the coronavirus broke out and had to be delayed.)  Neck:     Musculoskeletal: Neck supple.  Cardiovascular:     Rate and Rhythm: Normal rate and regular rhythm.     Heart sounds: Normal heart sounds.  Pulmonary:     Effort: Pulmonary effort is normal.     Breath sounds: Normal breath sounds.  Abdominal:     General: Bowel sounds are normal. There is no distension.     Palpations: Abdomen is soft.     Tenderness: There is no abdominal tenderness.  Musculoskeletal: Normal range of motion.        General: No swelling or tenderness.     Right lower leg: No edema.     Left lower leg: No edema.  Skin:    General: Skin is warm and dry.  Neurological:     General: No focal deficit present.     Mental Status: She is alert and oriented to person, place, and time.     GCS: GCS eye subscore is 4. GCS verbal subscore is 5. GCS motor subscore is 6.     Cranial Nerves: No  cranial nerve deficit.     Sensory: No sensory deficit.     Motor: No weakness.     Coordination: Coordination normal.  Psychiatric:  Mood and Affect: Mood normal.      ED Treatments / Results  Labs (all labs ordered are listed, but only abnormal results are displayed) Labs Reviewed  URINALYSIS, ROUTINE W REFLEX MICROSCOPIC - Abnormal; Notable for the following components:      Result Value   Specific Gravity, Urine <1.005 (*)    Hgb urine dipstick TRACE (*)    All other components within normal limits  URINALYSIS, MICROSCOPIC (REFLEX) - Abnormal; Notable for the following components:   Bacteria, UA RARE (*)    All other components within normal limits    EKG EKG Interpretation  Date/Time:  Monday May 11 2019 15:41:04 EDT Ventricular Rate:  72 PR Interval:    QRS Duration: 88 QT Interval:  385 QTC Calculation: 422 R Axis:   36 Text Interpretation:  Sinus rhythm Consider left atrial enlargement normal, no change from old Confirmed by Arby BarrettePfeiffer, Rachella Basden [phone]([id number]) on 05/11/2019 3:53:57 PM   Radiology No results found.  Procedures Procedures (including critical care time)  Medications Ordered in ED Medications  pantoprazole (PROTONIX) EC tablet 40 mg (40 mg Oral Given 05/11/19 1618)     Initial Impression / Assessment and Plan / ED Course  I have reviewed the triage vital signs and the nursing notes.  Pertinent labs & imaging results that were available during my care of the patient were reviewed by me and considered in my medical decision making (see chart for details).       Patient is clinically well in appearance.  Vital signs are stable.  She is not significantly hypertensive.  Patient has had over 4 months of symptoms that include a vague sort of dizziness.  This does not have any associated headache, visual change or neurologic dysfunction.  Patient is not hypotensive and I do not suspect orthostatic hypotension.  He perceives it to be most bothersome when  she gets out of bed in the morning but it can happen anytime during the day.  There is not any associated chest pain, shortness of breath palpitation or near syncope.  Patient's EKG is normal.  Rhythm has been normal.  She is referred for echo and cardiology follow-up.  I do not feel that other ED work-up is indicated at this time.  Nothing has acutely changed in the past 4 months.  There certainly is an element of this to suggest vertigo.  I will have patient trial low dose of Antivert.  We discussed starting with a low dose and discontinuing if any adverse side effects and the fact that she could conceivably increase the dose if she felt some benefit.  She is to follow-up with her PCP to review this.  Her PCP wanted her to be taking omeprazole.  We discussed omeprazole/prilosec.  She did not recognize this is a medication that she is actively taking.  She says she is not.  He denies she is taking any medication that is prescribed for her stomach.  She is to start this it was in her physician's plan as well.  She is counseled to follow-up this week to review her results and response to treatment.  Turn precautions reviewed.  Final Clinical Impressions(s) / ED Diagnoses   Final diagnoses:  Dizzy  Epigastric pain    ED Discharge Orders         Ordered    omeprazole (PRILOSEC) 20 MG capsule  Daily     05/11/19 1657    meclizine (ANTIVERT) 12.5 MG tablet  3 times daily PRN  05/11/19 1657           Arby BarrettePfeiffer, Shanice Poznanski, MD 05/11/19 1712

## 2019-05-11 NOTE — ED Notes (Signed)
Spoke to pt. Daughter and gave update.

## 2019-05-11 NOTE — ED Notes (Signed)
Pt. Has worn cardiac monitor for 24 hours per Pt.

## 2019-05-11 NOTE — ED Triage Notes (Signed)
Pt presents today for Dizziness, fatigue, and epigastric discomfort that has been going on intermittently for over 2 weeks.  Has been seen by primary last week and they have "ordered tests".  Had CXR and blood work done 3 days ago.  Has upcoming echo.  No new sx today.

## 2019-05-11 NOTE — Discharge Instructions (Signed)
1.  Take omeprazole once daily.  Follow dietary instructions for reflux disease.  This may help with the pain you are experiencing in your upper abdomen.  Follow-up with your doctor within the next 1 to 2 weeks for reevaluation.  The medications may take 1 to 4 days to start being effective. 2.  For dizziness particularly her morning symptoms try meclizine as prescribed.  May take this medication up to 3 times a day.  The medication is making you feel foggy or off balance, discontinue the medication. 4.  If you have worsening or concerning symptoms return to the emergency department.

## 2019-05-13 ENCOUNTER — Other Ambulatory Visit: Payer: Self-pay

## 2019-05-13 ENCOUNTER — Telehealth: Payer: Self-pay

## 2019-05-13 MED ORDER — ROSUVASTATIN CALCIUM 10 MG PO TABS: 10.0000 mg | ORAL_TABLET | Freq: Every day | ORAL | 2 refills | Status: DC

## 2019-05-13 NOTE — Telephone Encounter (Signed)
Pt called and was put on two medications at the hospital and wants to make sure medications and then new Crestor doesn't conflict. Medications are Crestor, Omeprazole and Meclizine. Please advise.

## 2019-05-13 NOTE — Progress Notes (Signed)
Released to Mychart

## 2019-05-14 NOTE — Telephone Encounter (Signed)
That is fine 

## 2019-05-15 NOTE — Progress Notes (Signed)
Cardiology Office Note:    Date:  05/18/2019   ID:  Faith ClockMary L Webb, DOB Nov 03, 1941, MRN [id number]  PCP:  Zola ButtonLowne Webb, Grayling CongressYvonne R, DO  Cardiologist:  Norman HerrlichBrian Kassiah Mccrory, MD   Referring MD: Zola ButtonLowne Webb, Grayling CongressYvonne Webb, *  ASSESSMENT:    1. Palpitations   2. Dizziness   3. Essential hypertension   4. Mixed hyperlipidemia    PLAN:    In order of problems listed above:  1. Improved with a beta-blocker.  The monitor she wore showed predominantly sinus tachycardia at this time and continue the beta-blocker if symptoms recur we can evaluate further in the future.  I do not see a real indication for an echocardiogram at this time 2. Resolved 3. Stable blood pressure target continue thiazide diuretic beta-blocker 4. Stable continue her statin  Next appointment as needed   Medication Adjustments/Labs and Tests Ordered: Current medicines are reviewed at length with the patient today.  Concerns regarding medicines are outlined above.  No orders of the defined types were placed in this encounter.  No orders of the defined types were placed in this encounter.    Chief Complaint  Patient presents with  . Palpitations    History of Present Illness:    Faith Webb is a 78 y.o. female with hypertension and hyperlipidemia who is being seen today for the evaluation of palpitation and shortness of breath at the request of Faith Webb, Faith Webb, *.  She was seen in [facility] emergency room 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  She described as forceful intermittent occurred in the early morning hours she actually checked her heart rate and blood pressure during episodes and had no alarm under digital device or real significant change.  She did notice her  hypertension was not at target and since she went on a beta-blocker in the last few weeks palpitation has resolved.  She has no chest pain shortness of breath edema orthopnea syncope and has no exercise intolerance.  She avoids over-the-counter proarrhythmic drugs.  As her blood pressures at target and her symptoms have resolved I advised her to continue on beta-blocker and I do not really see an indication for an echocardiogram at this time.  If the symptoms recur she will contact my office and I will arrange for ambulatory heart rhythm monitor and further evaluation.  Past Medical History:  Diagnosis Date  . Allergy   . Arthritis   . Hyperlipidemia   . Hypertension     Past Surgical History:  Procedure Laterality Date  . ABDOMINAL HYSTERECTOMY    . BREAST SURGERY      Current Medications: Current Meds  Medication Sig  . hydrochlorothiazide (HYDRODIURIL) 25 MG tablet Take 25 mg by mouth daily.  . hydrOXYzine (VISTARIL) 25 MG capsule Take 25 mg by mouth at bedtime.  . Magnesium Oxide 420 MG TABS Take 1 tablet by mouth daily.   . meclizine (ANTIVERT) 12.5 MG tablet Take 1 tablet (12.5 mg total) by mouth 3 (three) times daily as needed for dizziness.  . metoprolol tartrate (LOPRESSOR) 25 MG tablet Take 25 mg by mouth 2 (two) times daily.  . Omega-3 Fatty Acids (THE VERY FINEST FISH OIL) LIQD Take by mouth.  Marland Kitchen. omeprazole (PRILOSEC) 20 MG capsule Take 1 capsule (20 mg total)  by mouth daily.  . potassium chloride (K-DUR) 10 MEQ tablet TAKE 3 TABLETS BY MOUTH TWICE DAILY  . rosuvastatin (CRESTOR) 10 MG tablet Take 1 tablet (10 mg total) by mouth at bedtime.  . Vitamin D, Ergocalciferol, (DRISDOL) 1.25 MG (50000 UT) CAPS capsule TAKE 1 CAPSULE BY MOUTH EVERY OTHER WEEK     Allergies:   Patient has no known allergies.   Social History   Socioeconomic History  . Marital status: Widowed    Spouse name: Not on file  . Number of children: Not on file  . Years of education: Not on file  .  Highest education level: Not on file  Occupational History  . Not on file  Social Needs  . Financial resource strain: Not on file  . Food insecurity    Worry: Not on file    Inability: Not on file  . Transportation needs    Medical: Not on file    Non-medical: Not on file  Tobacco Use  . Smoking status: Never Smoker  . Smokeless tobacco: Never Used  Substance and Sexual Activity  . Alcohol use: Never    Frequency: Never  . Drug use: Never  . Sexual activity: Not on file  Lifestyle  . Physical activity    Days per week: Not on file    Minutes per session: Not on file  . Stress: Not on file  Relationships  . Social Herbalist on phone: Not on file    Gets together: Not on file    Attends religious service: Not on file    Active member of club or organization: Not on file    Attends meetings of clubs or organizations: Not on file    Relationship status: Not on file  Other Topics Concern  . Not on file  Social History Narrative  . Not on file     Family History: The patient's family history includes Cancer in her sister; Diabetes in her brother, father, mother, sister, sister, and sister; Heart disease in her mother; Hyperlipidemia in her brother, sister, and sister; Hypertension in her mother and sister; Kidney disease in her sister; Stroke in her brother, mother, and sister.  ROS:   Review of Systems  Constitution: Negative.  HENT: Negative.   Eyes: Negative.   Cardiovascular: Positive for palpitations.  Respiratory: Negative.   Endocrine: Negative.   Hematologic/Lymphatic: Negative.   Skin: Negative.   Musculoskeletal: Positive for joint pain (left scapula shoulder).  Gastrointestinal: Negative.   Genitourinary: Negative.   Neurological: Positive for dizziness.  Psychiatric/Behavioral: Negative.   Allergic/Immunologic: Negative.    Please see the history of present illness.     All other systems reviewed and are negative.  EKGs/Labs/Other Studies  Reviewed:    The following studies were reviewed today:  Holter Monitor Results Report  Patient Name: Faith FUREY   Date of Birth: 06-12-1941   Age: 78 y.o.  Primary Care Provider: Joya San, MD  Ordering Provider:  Doreatha Lew   Interpreting Physician: Denton Brick, MD [facility]  Indication: palpitations  Baseline Rhythm: sinus rhythm/sinus tachycardia  Rhythm Findings:  1. Ventricular: occasional PVCs  2. Supraventricular: No ectopy, but he does have sinus tachycardia throughout much of the daylight hours  3. Bradyarrhythmias and Pauses: none   4. Symptoms reported:  She reported a variety of symptoms including heart beating fast. During most of the spells normal sinus rhythm or mild sinus tachycardia was seen    CONCLUSIONS:  1. Unremarkable Holter monitor.   2. Mild sinus tachycardia and occasional PVCs as described  3. Symptoms were reported   4. Symptoms were not correlated with any rhythm problems except for mild sinus tachycardia       EKG:  EKG 05/12/2019  personally reviewed and demonstrates [facility] and is normal Chest x-ray 05/08/2019 portable was read as normal Recent Labs: 05/08/2019: ALT 13; BUN 19; Creat 0.99; Hemoglobin 13.8; Platelets 304; Potassium 3.4; Sodium 140; TSH 0.31  Recent Lipid Panel    Component Value Date/Time   CHOL 212 (H) 05/08/2019 1531   TRIG 140 05/08/2019 1531   HDL 62 05/08/2019 1531   CHOLHDL 3.4 05/08/2019 1531   LDLCALC 125 (H) 05/08/2019 1531    Physical Exam:    VS:  BP 134/82 (BP Location: Left Arm, Patient Position: Sitting, Cuff Size: Normal)   Pulse 84   Temp 97.9 F (36.6 C)   Ht 5\' 1"  (1.549 m)   Wt 138 lb 12.8 oz (63 kg)   SpO2 98%   BMI 26.23 kg/m     Wt Readings from Last 3 Encounters:  05/18/19 138 lb 12.8 oz (63 kg)  05/11/19 134 lb (60.8 kg)  05/08/19 139 lb 6.4 oz (63.2 kg)     GEN:  Well nourished, well developed in no acute distress HEENT: Normal NECK: No  JVD; No carotid bruits LYMPHATICS: No lymphadenopathy CARDIAC: RRR, no murmurs, rubs, gallops RESPIRATORY:  Clear to auscultation without rales, wheezing or rhonchi  ABDOMEN: Soft, non-tender, non-distended MUSCULOSKELETAL:  No edema; No deformity  SKIN: Warm and dry NEUROLOGIC:  Alert and oriented x 3 PSYCHIATRIC:  Normal affect     Signed, Norman HerrlichBrian Kaaliyah Kita, MD  05/18/2019 2:35 PM    [facility]

## 2019-05-18 ENCOUNTER — Encounter: Payer: Self-pay | Admitting: Cardiology

## 2019-05-18 ENCOUNTER — Ambulatory Visit (INDEPENDENT_AMBULATORY_CARE_PROVIDER_SITE_OTHER): Payer: Medicare Other | Admitting: Cardiology

## 2019-05-18 ENCOUNTER — Other Ambulatory Visit: Payer: Self-pay

## 2019-05-18 ENCOUNTER — Encounter: Payer: Self-pay | Admitting: *Deleted

## 2019-05-18 VITALS — BP 134/82 | HR 84 | Temp 97.9°F | Ht 61.0 in | Wt 138.8 lb

## 2019-05-18 DIAGNOSIS — I10 Essential (primary) hypertension: Secondary | ICD-10-CM | POA: Diagnosis not present

## 2019-05-18 DIAGNOSIS — R42 Dizziness and giddiness: Secondary | ICD-10-CM

## 2019-05-18 DIAGNOSIS — R002 Palpitations: Secondary | ICD-10-CM | POA: Diagnosis not present

## 2019-05-18 DIAGNOSIS — E782 Mixed hyperlipidemia: Secondary | ICD-10-CM | POA: Diagnosis not present

## 2019-05-18 NOTE — Patient Instructions (Signed)
Medication Instructions:  Your physician recommends that you continue on your current medications as directed. Please refer to the Current Medication list given to you today.  If you need a refill on your cardiac medications before your next appointment, please call your pharmacy.   Lab work: NOne If you have labs (blood work) drawn today and your tests are completely normal, you will receive your results only by: Marland Kitchen MyChart Message (if you have MyChart) OR . A paper copy in the mail If you have any lab test that is abnormal or we need to change your treatment, we will call you to review the results.  Testing/Procedures: None  Follow-Up: At [facility], you and your health needs are our priority.  As part of our continuing mission to provide you with exceptional heart care, we have created designated Provider Care Teams.  These Care Teams include your primary Cardiologist (physician) and Advanced Practice Providers (APPs -  Physician Assistants and Nurse Practitioners) who all work together to provide you with the care you need, when you need it. You will need a follow up appointment as needed Any Other Special Instructions Will Be Listed Below (If Applicable).

## 2019-05-18 NOTE — Telephone Encounter (Signed)
Spoke w/ Pt- informed of below. Pt verbalized understanding.  

## 2019-05-20 ENCOUNTER — Ambulatory Visit: Payer: Self-pay | Admitting: Family Medicine

## 2019-05-20 NOTE — Telephone Encounter (Signed)
I returned pt's call.   She saw Dr. Carollee Herter 2 wks ago for this cough.   It was from one of her BP medications.   She was changed around but she is still coughing  I attempted to warm transfer the call to the office however I could not get my phone to transfer so I'm requesting they call her back. I called the office and they requested I send a note over which I did.     Reason for Disposition . SEVERE coughing spells (e.g., whooping sound after coughing, vomiting after coughing)  Answer Assessment - Initial Assessment Questions 1. ONSET: "When did the cough begin?"      I 've had this cough a long time.   Long before the coronavirus started. 2. SEVERITY: "How bad is the cough today?"      I've not felt better since seeing her 2 wks ago.   I cough at night.  I went to the ED there and they put me a new medication and meclizine for dizziness.   My heart would beat faster.  I saw the cardiologist and he said everything was fine.    3. RESPIRATORY DISTRESS: "Describe your breathing."      No   I developed a pain in my left shoulder I think from the coughing.   It was both shoulders but now just the left. 4. FEVER: "Do you have a fever?" If so, ask: "What is your temperature, how was it measured, and when did it start?"     No 5. SPUTUM: "Describe the color of your sputum" (clear, white, yellow, green)     I'm coughing up clear sputum. 6. HEMOPTYSIS: "Are you coughing up any blood?" If so ask: "How much?" (flecks, streaks, tablespoons, etc.)     No 7. CARDIAC HISTORY: "Do you have any history of heart disease?" (e.g., heart attack, congestive heart failure)      No 8. LUNG HISTORY: "Do you have any history of lung disease?"  (e.g., pulmonary embolus, asthma, emphysema)     No 9. PE RISK FACTORS: "Do you have a history of blood clots?" (or: recent major surgery, recent prolonged travel, bedridden)     No 10. OTHER SYMPTOMS: "Do you have any other symptoms?" (e.g., runny nose, wheezing,  chest pain)       I have to blow my nose after I cough.   11. PREGNANCY: "Is there any chance you are pregnant?" "When was your last menstrual period?"       N/A 12. TRAVEL: "Have you traveled out of the country in the last month?" (e.g., travel history, exposures)       *No Answer*  Protocols used: [place]

## 2019-05-21 ENCOUNTER — Ambulatory Visit (INDEPENDENT_AMBULATORY_CARE_PROVIDER_SITE_OTHER): Payer: Medicare Other | Admitting: Family Medicine

## 2019-05-21 ENCOUNTER — Telehealth: Payer: Self-pay | Admitting: Family Medicine

## 2019-05-21 ENCOUNTER — Telehealth: Payer: Self-pay | Admitting: *Deleted

## 2019-05-21 ENCOUNTER — Other Ambulatory Visit: Payer: Self-pay

## 2019-05-21 ENCOUNTER — Encounter: Payer: Self-pay | Admitting: Family Medicine

## 2019-05-21 VITALS — BP 138/69 | HR 72 | Ht 61.0 in | Wt 138.0 lb

## 2019-05-21 DIAGNOSIS — R05 Cough: Secondary | ICD-10-CM

## 2019-05-21 DIAGNOSIS — Z20822 Contact with and (suspected) exposure to covid-19: Secondary | ICD-10-CM

## 2019-05-21 DIAGNOSIS — R059 Cough, unspecified: Secondary | ICD-10-CM

## 2019-05-21 MED ORDER — AZITHROMYCIN 250 MG PO TABS: ORAL_TABLET | ORAL | 0 refills | Status: DC

## 2019-05-21 MED ORDER — PROMETHAZINE-DM 6.25-15 MG/5ML PO SYRP: 5.0000 mL | ORAL_SOLUTION | Freq: Four times a day (QID) | ORAL | 0 refills | Status: DC | PRN

## 2019-05-21 NOTE — Telephone Encounter (Signed)
Cough x 3-4 months And is worsening

## 2019-05-21 NOTE — Telephone Encounter (Signed)
Pt scheduled for covid testing on 05/22/19 @ 10:45am @ [facility]. Instructions given and order placed.

## 2019-05-21 NOTE — Progress Notes (Signed)
Patient ID: Faith ClockMary L Ciampa, female    DOB: 05-02-1941  Age: 78 y.o. MRN: [id number]    Subjective:  Subjective  HPI Faith Webb presents for c/o cough and congestion x several months.  + productive  No fever   Review of Systems  Constitutional: Negative for appetite change, diaphoresis, fatigue, fever and unexpected weight change.  HENT: Positive for congestion.   Eyes: Negative for pain, redness and visual disturbance.  Respiratory: Positive for cough. Negative for chest tightness, shortness of breath and wheezing.   Cardiovascular: Negative for chest pain, palpitations and leg swelling.  Endocrine: Negative for cold intolerance, heat intolerance, polydipsia, polyphagia and polyuria.  Genitourinary: Negative for difficulty urinating, dysuria and frequency.  Neurological: Negative for dizziness, light-headedness, numbness and headaches.    History Past Medical History:  Diagnosis Date  . Allergy   . Arthritis   . Hyperlipidemia   . Hypertension     She has a past surgical history that includes Abdominal hysterectomy and Breast surgery.   Her family history includes Cancer in her sister; Diabetes in her brother, father, mother, sister, sister, and sister; Heart disease in her mother; Hyperlipidemia in her brother, sister, and sister; Hypertension in her mother and sister; Kidney disease in her sister; Stroke in her brother, mother, and sister.She reports that she has never smoked. She has never used smokeless tobacco. She reports that she does not drink alcohol or use drugs.  Current Outpatient Medications on File Prior to Visit  Medication Sig Dispense Refill  . hydrochlorothiazide (HYDRODIURIL) 25 MG tablet Take 25 mg by mouth daily.    . hydrOXYzine (VISTARIL) 25 MG capsule Take 25 mg by mouth at bedtime.    . Magnesium Oxide 420 MG TABS Take 1 tablet by mouth daily.     . meclizine (ANTIVERT) 12.5 MG tablet Take 1 tablet (12.5 mg total) by mouth 3 (three) times daily as needed  for dizziness. 30 tablet 0  . metoprolol tartrate (LOPRESSOR) 25 MG tablet Take 25 mg by mouth 2 (two) times daily.    . Omega-3 Fatty Acids (THE VERY FINEST FISH OIL) LIQD Take by mouth.    Marland Kitchen. omeprazole (PRILOSEC) 20 MG capsule Take 1 capsule (20 mg total) by mouth daily. 30 capsule 0  . potassium chloride (K-DUR) 10 MEQ tablet TAKE 3 TABLETS BY MOUTH TWICE DAILY    . rosuvastatin (CRESTOR) 10 MG tablet Take 1 tablet (10 mg total) by mouth at bedtime. 30 tablet 2  . Vitamin D, Ergocalciferol, (DRISDOL) 1.25 MG (50000 UT) CAPS capsule TAKE 1 CAPSULE BY MOUTH EVERY OTHER WEEK     No current facility-administered medications on file prior to visit.      Objective:  Objective  Physical Exam Vitals signs and nursing note reviewed.  Constitutional:      Appearance: She is well-developed. She is not diaphoretic.  HENT:     Head: Normocephalic and atraumatic.     Right Ear: Hearing, tympanic membrane, ear canal and external ear normal.     Left Ear: Hearing, tympanic membrane, ear canal and external ear normal.     Nose: Rhinorrhea present.     Right Sinus: No maxillary sinus tenderness or frontal sinus tenderness.     Left Sinus: No maxillary sinus tenderness or frontal sinus tenderness.     Mouth/Throat:     Mouth: Mucous membranes are not pale, not dry and not cyanotic.     Pharynx: Posterior oropharyngeal erythema present. No oropharyngeal exudate.  Cardiovascular:  Rate and Rhythm: Normal rate.  Pulmonary:     Effort: Pulmonary effort is normal.     Breath sounds: Normal breath sounds.    BP 138/69   Pulse 72   Ht 5\' 1"  (1.549 m)   Wt 138 lb (62.6 kg)   BMI 26.07 kg/m  Wt Readings from Last 3 Encounters:  05/21/19 138 lb (62.6 kg)  05/18/19 138 lb 12.8 oz (63 kg)  05/11/19 134 lb (60.8 kg)     Lab Results  Component Value Date   WBC 5.4 05/08/2019   HGB 13.8 05/08/2019   HCT 40.0 05/08/2019   PLT 304 05/08/2019   GLUCOSE 106 (H) 05/08/2019   CHOL 212 (H)  05/08/2019   TRIG 140 05/08/2019   HDL 62 05/08/2019   LDLCALC 125 (H) 05/08/2019   ALT 13 05/08/2019   AST 14 05/08/2019   NA 140 05/08/2019   K 3.4 (L) 05/08/2019   CL 100 05/08/2019   CREATININE 0.99 (H) 05/08/2019   BUN 19 05/08/2019   CO2 27 05/08/2019   TSH 0.31 (L) 05/08/2019    No results found.   Assessment & Plan:  Plan  I am having Shamyah L. Mcgruder start on azithromycin and promethazine-dextromethorphan. I am also having her maintain her hydrochlorothiazide, metoprolol tartrate, hydrOXYzine, The Very Finest Fish Oil, Magnesium Oxide, Vitamin D (Ergocalciferol), omeprazole, meclizine, rosuvastatin, and potassium chloride.  Meds ordered this encounter  Medications  . azithromycin (ZITHROMAX Z-PAK) 250 MG tablet    Sig: As directed    Dispense:  6 each    Refill:  0  . promethazine-dextromethorphan (PROMETHAZINE-DM) 6.25-15 MG/5ML syrup    Sig: Take 5 mLs by mouth 4 (four) times daily as needed.    Dispense:  118 mL    Refill:  0    Problem List Items Addressed This Visit    None    Visit Diagnoses    Cough    -  Primary   Relevant Medications   azithromycin (ZITHROMAX Z-PAK) 250 MG tablet   promethazine-dextromethorphan (PROMETHAZINE-DM) 6.25-15 MG/5ML syrup    covid testing pending  cxr done neg -- last visit   Follow-up: No follow-ups on file.  Ann Held, DO

## 2019-05-22 ENCOUNTER — Other Ambulatory Visit: Payer: Self-pay

## 2019-05-22 DIAGNOSIS — Z20822 Contact with and (suspected) exposure to covid-19: Secondary | ICD-10-CM

## 2019-05-27 LAB — NOVEL CORONAVIRUS, NAA: SARS-CoV-2, NAA: NOT DETECTED

## 2019-05-28 ENCOUNTER — Encounter: Payer: Self-pay | Admitting: Family Medicine

## 2019-05-29 ENCOUNTER — Other Ambulatory Visit: Payer: Self-pay | Admitting: Family Medicine

## 2019-05-29 DIAGNOSIS — R059 Cough, unspecified: Secondary | ICD-10-CM

## 2019-05-29 DIAGNOSIS — R05 Cough: Secondary | ICD-10-CM

## 2019-05-29 NOTE — Telephone Encounter (Signed)
The endo referral was placed --  I will have the referral coordinator check on it The chest xray is ordered ---  You just need to go to radiology on the 1st floor in our building I will be out of the office next week but someone will be covering me if you have any questions or concerns

## 2019-06-01 ENCOUNTER — Ambulatory Visit (HOSPITAL_BASED_OUTPATIENT_CLINIC_OR_DEPARTMENT_OTHER)
Admission: RE | Admit: 2019-06-01 | Discharge: 2019-06-01 | Disposition: A | Discharge status: Home / Self Care | Payer: Medicare Other | Source: Ambulatory Visit | Attending: Family Medicine | Admitting: Family Medicine

## 2019-06-01 ENCOUNTER — Other Ambulatory Visit: Payer: Self-pay

## 2019-06-01 DIAGNOSIS — R059 Cough, unspecified: Secondary | ICD-10-CM

## 2019-06-01 DIAGNOSIS — R05 Cough: Secondary | ICD-10-CM | POA: Insufficient documentation

## 2019-06-04 ENCOUNTER — Other Ambulatory Visit: Payer: Self-pay

## 2019-06-05 ENCOUNTER — Ambulatory Visit: Payer: Medicare Other | Admitting: Internal Medicine

## 2019-06-05 ENCOUNTER — Encounter: Payer: Self-pay | Admitting: Internal Medicine

## 2019-06-05 VITALS — BP 124/70 | HR 72 | Temp 98.0°F | Ht 61.0 in | Wt 140.4 lb

## 2019-06-05 DIAGNOSIS — E0590 Thyrotoxicosis, unspecified without thyrotoxic crisis or storm: Secondary | ICD-10-CM

## 2019-06-05 LAB — TSH: TSH: 0.59 u[IU]/mL (ref 0.35–4.50)

## 2019-06-05 LAB — T4, FREE: Free T4: 0.86 ng/dL (ref 0.60–1.60)

## 2019-06-05 NOTE — Progress Notes (Signed)
Name: Faith Webb  MRN/ DOB: [id number], January 13, 1941    Age/ Sex: 78 y.o., female    PCP: Zola ButtonLowne Chase, Grayling CongressYvonne R, DO   Reason for Endocrinology Evaluation: Low TSH      Date of Initial Endocrinology Evaluation: 06/05/2019     HPI: Ms. Faith Webb is a 78 y.o. female with a past medical history of HTN, Dyslipidemia . The patient presented for initial endocrinology clinic visit on 06/05/2019 for consultative assistance with her low TSH .   She started feeling tired ~ 3 months ago associated with palpitations, dizziness and sob, her symptoms are associated with anxiety . Her TSH was low at 0.321 uIU/mL .   Saw cardiology, cardiac monitor showed sinus tachycardia , currently controlled on Metoprolol.   Her weight has been stable with occasional diarrhea.   No recent  infections   Denies local neck symptoms.  No biotin.  Sister with Hashimoto's disease      HISTORY:  Past Medical History:  Past Medical History:  Diagnosis Date  . Allergy   . Arthritis   . Hyperlipidemia   . Hypertension    Past Surgical History:  Past Surgical History:  Procedure Laterality Date  . ABDOMINAL HYSTERECTOMY    . BREAST SURGERY        Social History:  reports that she has never smoked. She has never used smokeless tobacco. She reports that she does not drink alcohol or use drugs.  Family History: family history includes Cancer in her sister; Diabetes in her brother, father, mother, sister, sister, and sister; Heart disease in her mother; Hyperlipidemia in her brother, sister, and sister; Hypertension in her mother and sister; Kidney disease in her sister; Stroke in her brother, mother, and sister.   HOME MEDICATIONS: Allergies as of 06/05/2019   No Known Allergies     Medication List       Accurate as of June 05, 2019  8:12 AM. If you have any questions, ask your nurse or doctor.        azithromycin 250 MG tablet Commonly known as: Zithromax Z-Pak As directed    hydrochlorothiazide 25 MG tablet Commonly known as: HYDRODIURIL Take 25 mg by mouth daily.   hydrOXYzine 25 MG capsule Commonly known as: VISTARIL Take 25 mg by mouth at bedtime.   Magnesium Oxide 420 MG Tabs Take 1 tablet by mouth daily.   meclizine 12.5 MG tablet Commonly known as: ANTIVERT Take 1 tablet (12.5 mg total) by mouth 3 (three) times daily as needed for dizziness.   metoprolol tartrate 25 MG tablet Commonly known as: LOPRESSOR Take 25 mg by mouth 2 (two) times daily.   omeprazole 20 MG capsule Commonly known as: PRILOSEC Take 1 capsule (20 mg total) by mouth daily.   potassium chloride 10 MEQ tablet Commonly known as: K-DUR TAKE 3 TABLETS BY MOUTH TWICE DAILY   promethazine-dextromethorphan 6.25-15 MG/5ML syrup Commonly known as: PROMETHAZINE-DM Take 5 mLs by mouth 4 (four) times daily as needed.   rosuvastatin 10 MG tablet Commonly known as: Crestor Take 1 tablet (10 mg total) by mouth at bedtime.   The Very Finest Fish Oil Liqd Take by mouth.   Vitamin D (Ergocalciferol) 1.25 MG (50000 UT) Caps capsule Commonly known as: DRISDOL TAKE 1 CAPSULE BY MOUTH EVERY OTHER WEEK         REVIEW OF SYSTEMS: A comprehensive ROS was conducted with the patient and is negative except as per HPI and below:  Review of  Systems  Constitutional: Negative for chills and fever.  HENT: Negative for congestion and sore throat.   Eyes: Positive for pain. Negative for blurred vision.  Respiratory: Positive for shortness of breath. Negative for cough.   Cardiovascular: Positive for palpitations. Negative for chest pain.  Musculoskeletal: Positive for myalgias.  Neurological: Positive for tremors. Negative for tingling.  Psychiatric/Behavioral: Negative for depression. The patient is nervous/anxious.        OBJECTIVE:  VS: BP 124/70 (BP Location: Left Arm, Patient Position: Sitting, Cuff Size: Normal)   Pulse 72   Temp 98 F (36.7 C)   Ht 5\' 1"  (1.549 m)   Wt 140  lb 6.4 oz (63.7 kg)   SpO2 97%   BMI 26.53 kg/m    Wt Readings from Last 3 Encounters:  06/05/19 140 lb 6.4 oz (63.7 kg)  05/21/19 138 lb (62.6 kg)  05/18/19 138 lb 12.8 oz (63 kg)     EXAM: General: Pt appears well and is in NAD  Hydration: Well-hydrated with moist mucous membranes and good skin turgor  Eyes: External eye exam normal without stare, lid lag or exophthalmos.  EOM intact.  PERRL.  Ears, Nose, Throat: Hearing: Grossly intact bilaterally Dental: Good dentition  Throat: Clear without mass, erythema or exudate  Neck: General: Supple without adenopathy. Thyroid: Thyroid size normal.  No goiter or nodules appreciated. No thyroid bruit.  Lungs: Clear with good BS bilat with no rales, rhonchi, or wheezes  Heart: Auscultation: RRR.  Abdomen: Normoactive bowel sounds, soft, nontender, without masses or organomegaly palpable  Extremities:  BL LE: No pretibial edema normal ROM and strength.  Skin: Hair: Texture and amount normal with gender appropriate distribution Skin Inspection: No rashes Skin Palpation: Skin temperature, texture, and thickness normal to palpation  Neuro: Cranial nerves: II - XII grossly intact  Motor: Normal strength throughout DTRs: 2+ and symmetric in UE without delay in relaxation phase  Mental Status: Judgment, insight: Intact Orientation: Oriented to time, place, and person Mood and affect: No depression, anxiety, or agitation     DATA REVIEWED: Results for AYLA, DUNIGAN (MRN [id number]) as of 06/05/2019 13:49  Ref. Range 05/08/2019 15:31 06/05/2019 08:31  TSH Latest Ref Range: 0.35 - 4.50 uIU/mL 0.31 (L) 0.59  T4,Free(Direct) Latest Ref Range: 0.60 - 1.60 ng/dL  0.86    Results for LAMYAH, CREED (MRN [id number]) as of 06/05/2019 08:03  Ref. Range 05/08/2019 15:31  TSH Latest Ref Range: 0.40 - 4.50 mIU/L 0.31 (L)    ASSESSMENT/PLAN/RECOMMENDATIONS:   1. Subclinical Hyperthyroidism   The causes of subclinical hyperthyroidism are  autonomously  functioning thyroid adenomas and multinodular goiters or Graves' disease vs thyroiditis.   Most patients with subclinical hyperthyroidism have no clinical manifestations of hyperthyroidism, and those symptoms that are present (eg, tachycardia, tremor, dyspnea on exertion, weight loss) are mild and nonspecific." However, subclinical hyperthyroidism is associated with an increased risk of atrial fibrillation and, primarily in postmenopausal women, a decrease in bone mineral density.  - Repeat TFT's today are normal. Unclear the cause of her low TSH at the time, this could have been an erroneous result vs recovered thyroiditis.  - Her current symptoms are NOT related to thyroid    Will proceed with repeating lab in 6 weeks to confirm normalization.     F/u in 3 months   Addendum: Discussed lab results with pt on 7/10 at 1350   Signed electronically by: Mack Guise, MD  [facility]  [place]  [address] [place], [place] [address] Phone: [phone] FAX: [phone]   CC: Virgina OrganLowne Chase, [name], DO [address] [place] [address] Phone: [phone] Fax: [phone]   Return to Endocrinology clinic as below: Future Appointments  Date Time Provider Department Center  08/13/2019 10:00 AM [name], [name], DO [facility]

## 2019-06-05 NOTE — Patient Instructions (Signed)
-   We will repeat your thyroid function today, we will also check an antibody to see if you have a hereditary thyroid condition " Graves' Disease) If this is elevated we will start you on a pill.  - If your test is normal , then will have to proceed with a thyroid scan to check for thyroid nodules/cysts

## 2019-06-11 LAB — T3: T3, Total: 101 ng/dL (ref 76–181)

## 2019-06-11 LAB — TRAB (TSH RECEPTOR BINDING ANTIBODY): TRAB: <1 IU/L (ref <=2.00)

## 2019-07-10 ENCOUNTER — Other Ambulatory Visit: Payer: Self-pay | Admitting: Internal Medicine

## 2019-07-10 DIAGNOSIS — E0590 Thyrotoxicosis, unspecified without thyrotoxic crisis or storm: Secondary | ICD-10-CM

## 2019-07-17 ENCOUNTER — Other Ambulatory Visit: Payer: Self-pay

## 2019-07-17 ENCOUNTER — Other Ambulatory Visit (INDEPENDENT_AMBULATORY_CARE_PROVIDER_SITE_OTHER): Payer: Medicare Other

## 2019-07-17 DIAGNOSIS — E0590 Thyrotoxicosis, unspecified without thyrotoxic crisis or storm: Secondary | ICD-10-CM | POA: Diagnosis not present

## 2019-07-17 LAB — TSH: TSH: 0.48 u[IU]/mL (ref 0.35–4.50)

## 2019-07-17 LAB — T4, FREE: Free T4: 0.9 ng/dL (ref 0.60–1.60)

## 2019-07-28 ENCOUNTER — Other Ambulatory Visit: Payer: Self-pay

## 2019-07-29 ENCOUNTER — Ambulatory Visit (INDEPENDENT_AMBULATORY_CARE_PROVIDER_SITE_OTHER): Payer: Medicare Other | Admitting: Family Medicine

## 2019-07-29 ENCOUNTER — Other Ambulatory Visit: Payer: Self-pay

## 2019-07-29 ENCOUNTER — Encounter: Payer: Self-pay | Admitting: Family Medicine

## 2019-07-29 VITALS — BP 128/73 | Temp 96.9°F

## 2019-07-29 DIAGNOSIS — R002 Palpitations: Secondary | ICD-10-CM

## 2019-07-29 DIAGNOSIS — G4452 New daily persistent headache (NDPH): Secondary | ICD-10-CM

## 2019-07-29 DIAGNOSIS — R42 Dizziness and giddiness: Secondary | ICD-10-CM | POA: Diagnosis not present

## 2019-07-29 MED ORDER — FLUTICASONE PROPIONATE 50 MCG/ACT NA SUSP: 2.0000 | Freq: Every day | NASAL | 6 refills | Status: DC

## 2019-07-29 MED ORDER — LEVOCETIRIZINE DIHYDROCHLORIDE 5 MG PO TABS: 5.0000 mg | ORAL_TABLET | Freq: Every evening | ORAL | 5 refills | Status: DC

## 2019-07-29 NOTE — Progress Notes (Signed)
Virtual Visit via Video Note  I connected with Marguerita Merles on 07/29/19 at 10:00 AM EDT by a video enabled telemedicine application and verified that I am speaking with the correct person using two identifiers.  Location: Patient: home  Provider: home    I discussed the limitations of evaluation and management by telemedicine and the availability of in person appointments. The patient expressed understanding and agreed to proceed.  History of Present Illness: Pt is home c/o dizziness for a long time--- years   The palpitations are back as well.  She does admit to some headaches around the eyes and some allergy   Her bp was higher than normal for her last night but is better today  Pt has seen endo and cardiology ---- she will call her cardiologist and let him know her palpitations are back.  No chest pain or sob.     Observations/Objective: Vitals:   07/29/19 0934  BP: 128/73  Temp: (!) 96.9 F (36.1 C)   Pt is in NAD   Assessment and Plan: 1. New daily persistent headache Mri/ mra brain Try antihistamine / flonase for all symptoms  Consider neuro referral  - MR Brain Wo Contrast; Future - MR Angiogram Head Wo Contrast; Future - levocetirizine (XYZAL) 5 MG tablet; Take 1 tablet (5 mg total) by mouth every evening.  Dispense: 30 tablet; Refill: 5 - fluticasone (FLONASE) 50 MCG/ACT nasal spray; Place 2 sprays into both nostrils daily.  Dispense: 16 g; Refill: 6  2. Dizzy Ongoing and recently worsening  Mri/ mra brain Consider ent/ neuro  - levocetirizine (XYZAL) 5 MG tablet; Take 1 tablet (5 mg total) by mouth every evening.  Dispense: 30 tablet; Refill: 5 - fluticasone (FLONASE) 50 MCG/ACT nasal spray; Place 2 sprays into both nostrils daily.  Dispense: 16 g; Refill: 6  3. Palpitations F/u cardiology If symptoms worsen go to ER  Follow Up Instructions:    I discussed the assessment and treatment plan with the patient. The patient was provided an opportunity to ask  questions and all were answered. The patient agreed with the plan and demonstrated an understanding of the instructions.   The patient was advised to call back or seek an in-person evaluation if the symptoms worsen or if the condition fails to improve as anticipated.  I provided 25 minutes of non-face-to-face time during this encounter.   Ann Held, DO

## 2019-07-30 ENCOUNTER — Ambulatory Visit: Payer: Medicare Other | Admitting: Family Medicine

## 2019-08-07 ENCOUNTER — Other Ambulatory Visit: Payer: Self-pay | Admitting: Family Medicine

## 2019-08-07 ENCOUNTER — Ambulatory Visit (HOSPITAL_COMMUNITY)
Admission: RE | Admit: 2019-08-07 | Discharge: 2019-08-07 | Disposition: A | Discharge status: Home / Self Care | Payer: Medicare Other | Source: Ambulatory Visit | Attending: Family Medicine | Admitting: Family Medicine

## 2019-08-07 ENCOUNTER — Other Ambulatory Visit: Payer: Self-pay

## 2019-08-07 DIAGNOSIS — G4452 New daily persistent headache (NDPH): Secondary | ICD-10-CM | POA: Diagnosis not present

## 2019-08-07 DIAGNOSIS — I671 Cerebral aneurysm, nonruptured: Secondary | ICD-10-CM | POA: Diagnosis not present

## 2019-08-07 MED ORDER — HYDROCHLOROTHIAZIDE 25 MG PO TABS: 25.0000 mg | ORAL_TABLET | Freq: Every day | ORAL | 1 refills | Status: DC

## 2019-08-07 MED ORDER — POTASSIUM CHLORIDE ER 10 MEQ PO TBCR: 30.0000 meq | EXTENDED_RELEASE_TABLET | Freq: Two times a day (BID) | ORAL | 1 refills | Status: DC

## 2019-08-07 NOTE — Telephone Encounter (Signed)
Requested medication (s) are due for refill today: yes  Requested medication (s) are on the active medication list: yes  Future visit scheduled: yes  Notes to clinic: review for refill   Requested Prescriptions  Pending Prescriptions Disp Refills   potassium chloride (K-DUR) 10 MEQ tablet       Sig: Take 3 tablets (30 mEq total) by mouth 2 (two) times daily.     Endocrinology:  Minerals - Potassium Supplementation Failed - 08/07/2019 11:01 AM      Failed - K in normal range and within 360 days    Potassium  Date Value Ref Range Status  05/08/2019 3.4 (L) 3.5 - 5.3 mmol/L Final         Failed - Cr in normal range and within 360 days    Creat  Date Value Ref Range Status  05/08/2019 0.99 (H) 0.60 - 0.93 mg/dL Final    Comment:    For patients >105 years of age, the reference limit for Creatinine is approximately 13% higher for people identified as African-American. Renella Cunas - Valid encounter within last 12 months    Recent Outpatient Visits          1 week ago [place] [facility] [place], DO   2 months ago Cough   [facility] [place], DO   3 months ago [place] at [employer], DO      Future Appointments            In 6 days [place], DO [facility] [employer], [place]   In 2 weeks [place], Hilton Cork, MD [facility] [place]            hydrochlorothiazide (HYDRODIURIL) 25 MG tablet       Sig: Take 1 tablet (25 mg total) by mouth daily.     Cardiovascular: Diuretics - Thiazide Failed - 08/07/2019 11:01 AM      Failed - Cr in normal range and within 360 days    Creat  Date Value Ref Range Status  05/08/2019 0.99 (H) 0.60 - 0.93 mg/dL Final    Comment:    For patients >77 years of age, the reference limit for Creatinine is  approximately 13% higher for people identified as African-American. .          Failed - K in normal range and within 360 days    Potassium  Date Value Ref Range Status  05/08/2019 3.4 (L) 3.5 - 5.3 mmol/L Final         Passed - Ca in normal range and within 360 days    Calcium  Date Value Ref Range Status  05/08/2019 9.7 8.6 - 10.4 mg/dL Final         Passed - Na in normal range and within 360 days    Sodium  Date Value Ref Range Status  05/08/2019 140 135 - 146 mmol/L Final         Passed - Last BP in normal range    BP Readings from Last 1 Encounters:  07/29/19 128/73         Passed - Valid encounter within last 6 months    Recent Outpatient Visits          1  week ago Hydrographic surveyorDizzy   [facility] [place] Low[facility] Chase, Iolavonne R, DO   2 months ago Cough   [facility] [place] Lowne Chase, Lightstreetvonne R, DO   3 months ago Palpitations   [facility] [place] Lowne Chase, Lak[place]vonne R, DO      Future Appointments            In 6 days Zola ButtonLowne Chase, Grayling CongressYvonne R, DO [facility] [place] [place]   In 2 weeks [place], Iline OvenBrian J, MD [facility]

## 2019-08-07 NOTE — Telephone Encounter (Signed)
Medication Refill - Medication: potassium chloride (K-DUR) 10 MEQ tablet and hydrochlorothiazide (HYDRODIURIL) 25 MG tablet    Preferred Pharmacy (with phone number or street name):  [id number] - [address], [place] [phone] (Phone) [phone] (Fax)

## 2019-08-08 ENCOUNTER — Other Ambulatory Visit: Payer: Self-pay | Admitting: Family Medicine

## 2019-08-08 DIAGNOSIS — R519 Headache, unspecified: Secondary | ICD-10-CM

## 2019-08-08 DIAGNOSIS — I671 Cerebral aneurysm, nonruptured: Secondary | ICD-10-CM

## 2019-08-08 DIAGNOSIS — R42 Dizziness and giddiness: Secondary | ICD-10-CM

## 2019-08-10 ENCOUNTER — Encounter: Payer: Self-pay | Admitting: Neurology

## 2019-08-13 ENCOUNTER — Ambulatory Visit (INDEPENDENT_AMBULATORY_CARE_PROVIDER_SITE_OTHER): Payer: Medicare Other | Admitting: Family Medicine

## 2019-08-13 ENCOUNTER — Other Ambulatory Visit: Payer: Self-pay

## 2019-08-13 ENCOUNTER — Encounter: Payer: Self-pay | Admitting: Family Medicine

## 2019-08-13 VITALS — BP 110/76 | HR 76 | Temp 97.1°F | Resp 18 | Ht 61.0 in | Wt 140.4 lb

## 2019-08-13 DIAGNOSIS — E559 Vitamin D deficiency, unspecified: Secondary | ICD-10-CM | POA: Diagnosis not present

## 2019-08-13 DIAGNOSIS — I10 Essential (primary) hypertension: Secondary | ICD-10-CM

## 2019-08-13 DIAGNOSIS — E785 Hyperlipidemia, unspecified: Secondary | ICD-10-CM

## 2019-08-13 DIAGNOSIS — Z1239 Encounter for other screening for malignant neoplasm of breast: Secondary | ICD-10-CM

## 2019-08-13 DIAGNOSIS — E2839 Other primary ovarian failure: Secondary | ICD-10-CM

## 2019-08-13 DIAGNOSIS — Z23 Encounter for immunization: Secondary | ICD-10-CM

## 2019-08-13 LAB — COMPREHENSIVE METABOLIC PANEL
ALT: 18 U/L (ref 0–35)
AST: 13 U/L (ref 0–37)
Albumin: 4.3 g/dL (ref 3.5–5.2)
Alkaline Phosphatase: 51 U/L (ref 39–117)
BUN: 18 mg/dL (ref 6–23)
CO2: 32 mEq/L (ref 19–32)
Calcium: 10 mg/dL (ref 8.4–10.5)
Chloride: 104 mEq/L (ref 96–112)
Creatinine, Ser: 1.05 mg/dL (ref 0.40–1.20)
GFR: 61.24 mL/min (ref >60.00)
Glucose, Bld: 70 mg/dL (ref 70–99)
Potassium: 4 mEq/L (ref 3.5–5.1)
Sodium: 142 mEq/L (ref 135–145)
Total Bilirubin: 0.5 mg/dL (ref 0.2–1.2)
Total Protein: 6.7 g/dL (ref 6.0–8.3)

## 2019-08-13 LAB — LIPID PANEL
Cholesterol: 135 mg/dL (ref 0–200)
HDL: 59.7 mg/dL (ref >39.00)
LDL Cholesterol: 52 mg/dL (ref 0–99)
NonHDL: 75.72
Total CHOL/HDL Ratio: 2
Triglycerides: 120 mg/dL (ref 0.0–149.0)
VLDL: 24 mg/dL (ref 0.0–40.0)

## 2019-08-13 MED ORDER — VITAMIN D (ERGOCALCIFEROL) 1.25 MG (50000 UNIT) PO CAPS: ORAL_CAPSULE | ORAL | 3 refills | Status: DC

## 2019-08-13 NOTE — Assessment & Plan Note (Signed)
Tolerating statin, encouraged heart healthy diet, avoid trans fats, minimize simple carbs and saturated fats. Increase exercise as tolerated 

## 2019-08-13 NOTE — Patient Instructions (Signed)
Vitamin D Deficiency Vitamin D deficiency is when your body does not have enough vitamin D. Vitamin D is important to your body for many reasons:  It helps the body absorb two important minerals--calcium and phosphorus.  It plays a role in bone health.  It may help to prevent some diseases, such as diabetes and multiple sclerosis.  It plays a role in muscle function, including heart function. If vitamin D deficiency is severe, it can cause a condition in which your bones become soft. In adults, this condition is called osteomalacia. In children, this condition is called rickets. What are the causes? This condition may be caused by:  Not eating enough foods that contain vitamin D.  Not getting enough natural sun exposure.  Having certain digestive system diseases that make it difficult for your body to absorb vitamin D. These diseases include Crohn's disease, chronic pancreatitis, and cystic fibrosis.  Having a surgery in which a part of the stomach or a part of the small intestine is removed.  Having chronic kidney disease or liver disease. What increases the risk? You are more likely to develop this condition if you:  Are older.  Do not spend much time outdoors.  Live in a long-term care facility.  Have had broken bones.  Have weak or thin bones (osteoporosis).  Have a disease or condition that changes how the body absorbs vitamin D.  Have dark skin.  Take certain medicines, such as steroid medicines or certain seizure medicines.  Are overweight or obese. What are the signs or symptoms? In mild cases of vitamin D deficiency, there may not be any symptoms. If the condition is severe, symptoms may include:  Bone pain.  Muscle pain.  Falling often.  Broken bones caused by a minor injury. How is this diagnosed? This condition may be diagnosed with blood tests. Imaging tests such as X-rays may also be done to look for changes in the bone. How is this  treated? Treatment for this condition may depend on what caused the condition. Treatment options include:  Taking vitamin D supplements. Your health care provider will suggest what dose is best for you.  Taking a calcium supplement. Your health care provider will suggest what dose is best for you. Follow these instructions at home: Eating and drinking   Eat foods that contain vitamin D. Choices include: ? Fortified dairy products, cereals, or juices. Fortified means that vitamin D has been added to the food. Check the label on the package to see if the food is fortified. ? Fatty fish, such as salmon or trout. ? Eggs. ? Oysters. ? Mushrooms. The items listed above may not be a complete list of recommended foods and beverages. Contact a dietitian for more information. General instructions  Take medicines and supplements only as told by your health care provider.  Get regular, safe exposure to natural sunlight.  Do not use a tanning bed.  Maintain a healthy weight. Lose weight if needed.  Keep all follow-up visits as told by your health care provider. This is important. How is this prevented? You can get vitamin D by:  Eating foods that naturally contain vitamin D.  Eating or drinking products that have been fortified with vitamin D, such as cereals, juices, and dairy products (including milk).  Taking a vitamin D supplement or a multivitamin supplement that contains vitamin D.  Being in the sun. Your body naturally makes vitamin D when your skin is exposed to sunlight. Your body changes the sunlight into   a form of the vitamin that it can use. Contact a health care provider if:  Your symptoms do not go away.  You feel nauseous or you vomit.  You have fewer bowel movements than usual or are constipated. Summary  Vitamin D deficiency is when your body does not have enough vitamin D.  Vitamin D is important to your body for good bone health and muscle function, and it may  help prevent some diseases.  Vitamin D deficiency is primarily treated through supplementation. Your health care provider will suggest what dose is best for you.  You can get vitamin D by eating foods that contain vitamin D, by being in the sun, and by taking a vitamin D supplement or a multivitamin supplement that contains vitamin D. This information is not intended to replace advice given to you by your health care provider. Make sure you discuss any questions you have with your health care provider. Document Released: 02/04/2012 Document Revised: 07/21/2018 Document Reviewed: 07/21/2018 Elsevier Patient Education  2020 [employer]vier Inc.  

## 2019-08-13 NOTE — Assessment & Plan Note (Signed)
Well controlled, no changes to meds. Encouraged heart healthy diet such as the DASH diet and exercise as tolerated.  °

## 2019-08-13 NOTE — Progress Notes (Signed)
Patient ID: Faith Webb, female    DOB: May 18, 1941  Age: 78 y.o. MRN: [id number]    Subjective:  Subjective  HPI Faith Webb presents for bp and chol.  Pt has cardiology, endo and neuro app soon  Pt is feeling better   Review of Systems  Constitutional: Negative for activity change, appetite change, fatigue and unexpected weight change.  Respiratory: Negative for cough and shortness of breath.   Cardiovascular: Negative for chest pain and palpitations.  Psychiatric/Behavioral: Negative for behavioral problems and dysphoric mood. The patient is not nervous/anxious.     History Past Medical History:  Diagnosis Date   Allergy    Arthritis    Hyperlipidemia    Hypertension     She has a past surgical history that includes Abdominal hysterectomy and Breast surgery.   Her family history includes Cancer in her sister; Diabetes in her brother, father, mother, sister, sister, and sister; Heart disease in her mother; Hyperlipidemia in her brother, sister, and sister; Hypertension in her mother and sister; Kidney disease in her sister; Stroke in her brother, mother, and sister.She reports that she has never smoked. She has never used smokeless tobacco. She reports that she does not drink alcohol or use drugs.  Current Outpatient Medications on File Prior to Visit  Medication Sig Dispense Refill   fluticasone (FLONASE) 50 MCG/ACT nasal spray Place 2 sprays into both nostrils daily. 16 g 6   hydrochlorothiazide (HYDRODIURIL) 25 MG tablet Take 1 tablet (25 mg total) by mouth daily. 90 tablet 1   hydrOXYzine (VISTARIL) 25 MG capsule Take 25 mg by mouth at bedtime.     levocetirizine (XYZAL) 5 MG tablet Take 1 tablet (5 mg total) by mouth every evening. 30 tablet 5   Magnesium Oxide 420 MG TABS Take 1 tablet by mouth daily.      meclizine (ANTIVERT) 12.5 MG tablet Take 1 tablet (12.5 mg total) by mouth 3 (three) times daily as needed for dizziness. 30 tablet 0   metoprolol tartrate  (LOPRESSOR) 25 MG tablet Take 25 mg by mouth 2 (two) times daily.     Omega-3 Fatty Acids (THE VERY FINEST FISH OIL) LIQD Take by mouth.     omeprazole (PRILOSEC) 20 MG capsule Take 1 capsule (20 mg total) by mouth daily. 30 capsule 0   potassium chloride (K-DUR) 10 MEQ tablet Take 3 tablets (30 mEq total) by mouth 2 (two) times daily. 90 tablet 1   rosuvastatin (CRESTOR) 10 MG tablet Take 1 tablet (10 mg total) by mouth at bedtime. 30 tablet 2   No current facility-administered medications on file prior to visit.      Objective:  Objective  Physical Exam Vitals signs and nursing note reviewed.  Constitutional:      Appearance: She is well-developed.  HENT:     Head: Normocephalic and atraumatic.  Eyes:     Conjunctiva/sclera: Conjunctivae normal.  Neck:     Musculoskeletal: Normal range of motion and neck supple.     Thyroid: No thyromegaly.     Vascular: No carotid bruit or JVD.  Cardiovascular:     Rate and Rhythm: Normal rate and regular rhythm.     Heart sounds: Normal heart sounds. No murmur.  Pulmonary:     Effort: Pulmonary effort is normal. No respiratory distress.     Breath sounds: Normal breath sounds. No wheezing or rales.  Chest:     Chest wall: No tenderness.  Neurological:     Mental Status: She  is alert and oriented to person, place, and time.    BP 110/76 (BP Location: Left Arm, Patient Position: Sitting, Cuff Size: Normal)    Pulse 76    Temp (!) 97.1 F (36.2 C) (Temporal)    Resp 18    Ht 5\' 1"  (1.549 m)    Wt 140 lb 6.4 oz (63.7 kg)    SpO2 98%    BMI 26.53 kg/m  Wt Readings from Last 3 Encounters:  08/13/19 140 lb 6.4 oz (63.7 kg)  06/05/19 140 lb 6.4 oz (63.7 kg)  05/21/19 138 lb (62.6 kg)     Lab Results  Component Value Date   WBC 5.4 05/08/2019   HGB 13.8 05/08/2019   HCT 40.0 05/08/2019   PLT 304 05/08/2019   GLUCOSE 106 (H) 05/08/2019   CHOL 212 (H) 05/08/2019   TRIG 140 05/08/2019   HDL 62 05/08/2019   LDLCALC 125 (H) 05/08/2019    ALT 13 05/08/2019   AST 14 05/08/2019   NA 140 05/08/2019   K 3.4 (L) 05/08/2019   CL 100 05/08/2019   CREATININE 0.99 (H) 05/08/2019   BUN 19 05/08/2019   CO2 27 05/08/2019   TSH 0.48 07/17/2019    Mr Angiogram Head Wo Contrast  Result Date: 08/07/2019 CLINICAL DATA:  Headache, acute, normal neuro exam. Persistent daily headaches. Increased dizziness and fatigue. Progressive symptoms. EXAM: MRI HEAD WITHOUT CONTRAST MRA HEAD WITHOUT CONTRAST TECHNIQUE: Multiplanar, multiecho pulse sequences of the brain and surrounding structures were obtained without intravenous contrast. Angiographic images of the head were obtained using MRA technique without contrast. COMPARISON:  None. FINDINGS: MRI HEAD FINDINGS Brain: No acute infarct, hemorrhage, or mass lesion is present. Scattered subcortical T2 hyperintensities are within normal limits for age. Mild atrophy is also normal for age. The ventricles are of normal size. No significant extraaxial fluid collection is present. The internal auditory canals are within normal limits. The brainstem and cerebellum are within normal limits. Vascular: Flow is present in the major intracranial arteries. The globes and orbits are within normal limits. Skull and upper cervical spine: The craniocervical junction is normal. Upper cervical spine is within normal limits. Marrow signal is unremarkable. Sinuses/Orbits: The paranasal sinuses and mastoid air cells are clear. The globes and orbits are within normal limits. 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 The internal carotid arteries are otherwise within normal limits bilaterally. Right A1 segment is dominant. The anterior communicating artery is patent. MCA bifurcations are intact. The ACA and MCA branch vessels are normal. The left vertebral artery is the dominant vessel. PICA origins are visualized and normal. The basilar artery is normal. Both posterior cerebral  arteries originate from the basilar tip. PCA branch vessels are normal bilaterally. IMPRESSION: 1. Normal MRI the brain for age. No acute or focal lesion to explain headaches or dizziness. 2. 2 mm left paraophthalmic artery aneurysm. Recommend 6-12 month follow-up with MRA to assure stability. 3. MRA circle-of-Willis is otherwise within limits. Electronically Signed   By: Marin Robertshristopher  Mattern M.D.   On: 08/07/2019 17:53   Mr Brain Wo Contrast  Result Date: 08/07/2019 CLINICAL DATA:  Headache, acute, normal neuro exam. Persistent daily headaches. Increased dizziness and fatigue. Progressive symptoms. EXAM: MRI HEAD WITHOUT CONTRAST MRA HEAD WITHOUT CONTRAST TECHNIQUE: Multiplanar, multiecho pulse sequences of the brain and surrounding structures were obtained without intravenous contrast. Angiographic images of the head were obtained using MRA technique without contrast. COMPARISON:  None. FINDINGS:  MRI HEAD FINDINGS Brain: No acute infarct, hemorrhage, or mass lesion is present. Scattered subcortical T2 hyperintensities are within normal limits for age. Mild atrophy is also normal for age. The ventricles are of normal size. No significant extraaxial fluid collection is present. The internal auditory canals are within normal limits. The brainstem and cerebellum are within normal limits. Vascular: Flow is present in the major intracranial arteries. The globes and orbits are within normal limits. Skull and upper cervical spine: The craniocervical junction is normal. Upper cervical spine is within normal limits. Marrow signal is unremarkable. Sinuses/Orbits: The paranasal sinuses and mastoid air cells are clear. The globes and orbits are within normal limits. 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 The internal carotid arteries are otherwise within normal limits bilaterally. Right A1 segment is dominant. The anterior communicating artery is patent. MCA  bifurcations are intact. The ACA and MCA branch vessels are normal. The left vertebral artery is the dominant vessel. PICA origins are visualized and normal. The basilar artery is normal. Both posterior cerebral arteries originate from the basilar tip. PCA branch vessels are normal bilaterally. IMPRESSION: 1. Normal MRI the brain for age. No acute or focal lesion to explain headaches or dizziness. 2. 2 mm left paraophthalmic artery aneurysm. Recommend 6-12 month follow-up with MRA to assure stability. 3. MRA circle-of-Willis is otherwise within limits. Electronically Signed   By: San Morelle M.D.   On: 08/07/2019 17:53     Assessment & Plan:  Plan  I have changed Faith Webb "[name]"'s Vitamin D (Ergocalciferol). I am also having her maintain her metoprolol tartrate, hydrOXYzine, The Very Finest Fish Oil, Magnesium Oxide, omeprazole, meclizine, rosuvastatin, levocetirizine, fluticasone, potassium chloride, and hydrochlorothiazide.  Meds ordered this encounter  Medications   Vitamin D, Ergocalciferol, (DRISDOL) 1.25 MG (50000 UT) CAPS capsule    Sig: 1 po q week    Dispense:  12 capsule    Refill:  3    Problem List Items Addressed This Visit      Unprioritized   Hyperlipidemia    Tolerating statin, encouraged heart healthy diet, avoid trans fats, minimize simple carbs and saturated fats. Increase exercise as tolerated      Relevant Orders   Lipid panel   Comprehensive metabolic panel   Hypertension    Well controlled, no changes to meds. Encouraged heart healthy diet such as the DASH diet and exercise as tolerated.       Relevant Orders   Lipid panel   Comprehensive metabolic panel    Other Visit Diagnoses    Need for influenza vaccination    -  Primary   Relevant Orders   Flu Vaccine QUAD High Dose(Fluad) (Completed)   Vitamin D deficiency       Relevant Medications   Vitamin D, Ergocalciferol, (DRISDOL) 1.25 MG (50000 UT) CAPS capsule   Estrogen deficiency        Relevant Orders   DG Bone Density   Breast cancer screening       Relevant Orders   MM DIGITAL SCREENING BILATERAL      Follow-up: Return in about 6 months (around 02/10/2020), or if symptoms worsen or fail to improve, for hypertension, hyperlipidemia.  Ann Held, DO

## 2019-08-17 ENCOUNTER — Ambulatory Visit (HOSPITAL_COMMUNITY): Payer: Medicare Other

## 2019-08-18 ENCOUNTER — Other Ambulatory Visit: Payer: Self-pay | Admitting: *Deleted

## 2019-08-18 MED ORDER — ROSUVASTATIN CALCIUM 10 MG PO TABS: 10.0000 mg | ORAL_TABLET | Freq: Every day | ORAL | 1 refills | Status: DC

## 2019-08-25 NOTE — Progress Notes (Signed)
Cardiology Office Note:    Date:  08/26/2019   ID:  Faith Webb, DOB 01-26-1941, MRN [id number]  PCP:  Carollee Herter, Alferd Apa, DO  Cardiologist:  Shirlee More, MD    Referring MD: Carollee Herter, Alferd Apa, *    ASSESSMENT:    1. Palpitations   2. Essential hypertension   3. Mixed hyperlipidemia    PLAN:    In order of problems listed above:  1. Improved although it appears the predominant problem was sinus tachycardia on event monitoring continue her beta-blocker take the second dose closer to bedtime reassurance. 2. Stable BP at target continue current treatment 3. Continue your statin lipids are ideal   Next appointment: As needed   Medication Adjustments/Labs and Tests Ordered: Current medicines are reviewed at length with the patient today.  Concerns regarding medicines are outlined above.  No orders of the defined types were placed in this encounter.  No orders of the defined types were placed in this encounter.   Chief Complaint  Patient presents with  . Follow-up  . Palpitations    History of Present Illness:    Faith Webb is a 78 y.o. female with a hx of palpitation improved with a beta blocker last seen 05/18/2019. Compliance with diet, lifestyle and medications: Yes  Overall is doing better when she first goes to bed at night she is aware of her heart and there may be an element of anxiety.  I asked her to take her second dose of metoprolol later in the evening about an hour before bedtime.  I do not think she needs to wear an event monitor repeat at this time.  Other than this she is not having palpitation or arrhythmia.  She has had some lightheadedness unrelated to shifts in posture not severe or sustained and no edema shortness of breath or chest pain. Past Medical History:  Diagnosis Date  . Allergy   . Arthritis   . Hyperlipidemia   . Hypertension     Past Surgical History:  Procedure Laterality Date  . ABDOMINAL HYSTERECTOMY    . BREAST  BIOPSY Bilateral   . BREAST SURGERY      Current Medications: Current Meds  Medication Sig  . hydrochlorothiazide (HYDRODIURIL) 25 MG tablet Take 1 tablet (25 mg total) by mouth daily.  Marland Kitchen levocetirizine (XYZAL) 5 MG tablet Take 1 tablet (5 mg total) by mouth every evening.  . Magnesium Oxide 420 MG TABS Take 1 tablet by mouth daily.   . meclizine (ANTIVERT) 12.5 MG tablet Take 1 tablet (12.5 mg total) by mouth 3 (three) times daily as needed for dizziness.  . metoprolol tartrate (LOPRESSOR) 25 MG tablet Take 25 mg by mouth 2 (two) times daily.  . Omega-3 Fatty Acids (THE VERY FINEST FISH OIL) LIQD Take by mouth.  Marland Kitchen omeprazole (PRILOSEC) 20 MG capsule Take 20 mg by mouth daily as needed.  . potassium chloride (K-DUR) 10 MEQ tablet Take 3 tablets (30 mEq total) by mouth 2 (two) times daily.  . rosuvastatin (CRESTOR) 10 MG tablet Take 1 tablet (10 mg total) by mouth at bedtime.  . Vitamin D, Ergocalciferol, (DRISDOL) 1.25 MG (50000 UT) CAPS capsule 1 po q week     Allergies:   Patient has no known allergies.   Social History   Socioeconomic History  . Marital status: Widowed    Spouse name: Not on file  . Number of children: Not on file  . Years of education: Not on file  .  Highest education level: Not on file  Occupational History  . Not on file  Social Needs  . Financial resource strain: Not on file  . Food insecurity    Worry: Not on file    Inability: Not on file  . Transportation needs    Medical: Not on file    Non-medical: Not on file  Tobacco Use  . Smoking status: Never Smoker  . Smokeless tobacco: Never Used  Substance and Sexual Activity  . Alcohol use: Never    Frequency: Never  . Drug use: Never  . Sexual activity: Not on file  Lifestyle  . Physical activity    Days per week: Not on file    Minutes per session: Not on file  . Stress: Not on file  Relationships  . Social Musician on phone: Not on file    Gets together: Not on file     Attends religious service: Not on file    Active member of club or organization: Not on file    Attends meetings of clubs or organizations: Not on file    Relationship status: Not on file  Other Topics Concern  . Not on file  Social History Narrative  . Not on file     Family History: The patient's family history includes Cancer in her sister; Diabetes in her brother, father, mother, sister, sister, and sister; Heart disease in her mother; Hyperlipidemia in her brother, sister, and sister; Hypertension in her mother and sister; Kidney disease in her sister; Stroke in her brother, mother, and sister. ROS:   Please see the history of present illness.    All other systems reviewed and are negative.  EKGs/Labs/Other Studies Reviewed:    The following studies were reviewed today:  EKG:  EKG ordered today and personally reviewed.  The ekg ordered today demonstrates sinus rhythm right atrial enlargement poor R wave progression these are nonspecific findings  Recent Labs: 05/08/2019: Hemoglobin 13.8; Platelets 304 07/17/2019: TSH 0.48 08/13/2019: ALT 18; BUN 18; Creatinine, Ser 1.05; Potassium 4.0; Sodium 142  Recent Lipid Panel    Component Value Date/Time   CHOL 135 08/13/2019 1039   TRIG 120.0 08/13/2019 1039   HDL 59.70 08/13/2019 1039   CHOLHDL 2 08/13/2019 1039   VLDL 24.0 08/13/2019 1039   LDLCALC 52 08/13/2019 1039   LDLCALC 125 (H) 05/08/2019 1531    Physical Exam:    VS:  BP 134/80 (BP Location: Right Arm, Patient Position: Sitting, Cuff Size: Normal)   Pulse 69   Temp 98.4 F (36.9 C)   Ht 5\' 1"  (1.549 m)   Wt 140 lb (63.5 kg)   SpO2 98%   BMI 26.45 kg/m     Wt Readings from Last 3 Encounters:  08/26/19 140 lb (63.5 kg)  08/13/19 140 lb 6.4 oz (63.7 kg)  06/05/19 140 lb 6.4 oz (63.7 kg)     GEN:  Well nourished, well developed in no acute distress HEENT: Normal NECK: No JVD; No carotid bruits LYMPHATICS: No lymphadenopathy CARDIAC: RRR, no murmurs, rubs,  gallops RESPIRATORY:  Clear to auscultation without rales, wheezing or rhonchi  ABDOMEN: Soft, non-tender, non-distended MUSCULOSKELETAL:  No edema; No deformity  SKIN: Warm and dry NEUROLOGIC:  Alert and oriented x 3 PSYCHIATRIC:  Normal affect    Signed, 08/06/19, MD  08/26/2019 2:59 PM    [facility]

## 2019-08-26 ENCOUNTER — Encounter: Payer: Self-pay | Admitting: Cardiology

## 2019-08-26 ENCOUNTER — Ambulatory Visit (HOSPITAL_BASED_OUTPATIENT_CLINIC_OR_DEPARTMENT_OTHER)
Admission: RE | Admit: 2019-08-26 | Discharge: 2019-08-26 | Disposition: A | Discharge status: Home / Self Care | Payer: Medicare Other | Source: Ambulatory Visit | Attending: Family Medicine | Admitting: Family Medicine

## 2019-08-26 ENCOUNTER — Encounter (HOSPITAL_BASED_OUTPATIENT_CLINIC_OR_DEPARTMENT_OTHER): Payer: Self-pay

## 2019-08-26 ENCOUNTER — Ambulatory Visit (INDEPENDENT_AMBULATORY_CARE_PROVIDER_SITE_OTHER): Payer: Medicare Other | Admitting: Cardiology

## 2019-08-26 ENCOUNTER — Other Ambulatory Visit: Payer: Self-pay

## 2019-08-26 VITALS — BP 134/80 | HR 69 | Temp 98.4°F | Ht 61.0 in | Wt 140.0 lb

## 2019-08-26 DIAGNOSIS — Z13820 Encounter for screening for osteoporosis: Secondary | ICD-10-CM | POA: Diagnosis not present

## 2019-08-26 DIAGNOSIS — Z1231 Encounter for screening mammogram for malignant neoplasm of breast: Secondary | ICD-10-CM | POA: Diagnosis present

## 2019-08-26 DIAGNOSIS — E2839 Other primary ovarian failure: Secondary | ICD-10-CM

## 2019-08-26 DIAGNOSIS — I10 Essential (primary) hypertension: Secondary | ICD-10-CM | POA: Diagnosis not present

## 2019-08-26 DIAGNOSIS — Z1239 Encounter for other screening for malignant neoplasm of breast: Secondary | ICD-10-CM

## 2019-08-26 DIAGNOSIS — Z780 Asymptomatic menopausal state: Secondary | ICD-10-CM | POA: Diagnosis not present

## 2019-08-26 DIAGNOSIS — E782 Mixed hyperlipidemia: Secondary | ICD-10-CM | POA: Diagnosis not present

## 2019-08-26 DIAGNOSIS — R002 Palpitations: Secondary | ICD-10-CM | POA: Diagnosis not present

## 2019-08-26 NOTE — Patient Instructions (Signed)
Medication Instructions:  Your physician recommends that you continue on your current medications as directed. Please refer to the Current Medication list given to you today.  **Take your evening dose of metoprolol tartrate 25 mg at 8:00 pm every night.   If you need a refill on your cardiac medications before your next appointment, please call your pharmacy.   Lab work: None  If you have labs (blood work) drawn today and your tests are completely normal, you will receive your results only by: Marland Kitchen MyChart Message (if you have MyChart) OR . A paper copy in the mail If you have any lab test that is abnormal or we need to change your treatment, we will call you to review the results.  Testing/Procedures: You had an EKG today.   Follow-Up: At [facility], you and your health needs are our priority.  As part of our continuing mission to provide you with exceptional heart care, we have created designated Provider Care Teams.  These Care Teams include your primary Cardiologist (physician) and Advanced Practice Providers (APPs -  Physician Assistants and Nurse Practitioners) who all work together to provide you with the care you need, when you need it. You will need a follow up appointment as needed if symptoms worsen or fail to improve.

## 2019-09-02 ENCOUNTER — Other Ambulatory Visit: Payer: Self-pay

## 2019-09-04 ENCOUNTER — Encounter: Payer: Self-pay | Admitting: Internal Medicine

## 2019-09-04 ENCOUNTER — Other Ambulatory Visit: Payer: Self-pay

## 2019-09-04 ENCOUNTER — Ambulatory Visit (INDEPENDENT_AMBULATORY_CARE_PROVIDER_SITE_OTHER): Payer: Medicare Other | Admitting: Internal Medicine

## 2019-09-04 ENCOUNTER — Other Ambulatory Visit: Payer: Self-pay | Admitting: Family Medicine

## 2019-09-04 VITALS — BP 118/64 | HR 69 | Temp 98.8°F | Ht 61.0 in | Wt 141.4 lb

## 2019-09-04 DIAGNOSIS — E0590 Thyrotoxicosis, unspecified without thyrotoxic crisis or storm: Secondary | ICD-10-CM

## 2019-09-04 LAB — TSH: TSH: 0.62 u[IU]/mL (ref 0.35–4.50)

## 2019-09-04 LAB — T4, FREE: Free T4: 0.78 ng/dL (ref 0.60–1.60)

## 2019-09-04 MED ORDER — POTASSIUM CHLORIDE ER 10 MEQ PO TBCR: 30.0000 meq | EXTENDED_RELEASE_TABLET | Freq: Two times a day (BID) | ORAL | 1 refills | Status: DC

## 2019-09-04 NOTE — Progress Notes (Signed)
Name: Faith Webb  MRN/ DOB: [id number], 12/03/1940    Age/ Sex: 78 y.o., female     PCP: Zola Button, Grayling Congress, DO   Reason for Endocrinology Evaluation: Subclinical hyperthyroidism     Initial Endocrinology Clinic Visit: 06/05/2019    PATIENT IDENTIFIER: Faith Webb is a 78 y.o., female with a past medical history of HTn and Dyslipidemia. She has followed with [facility] since 06/05/2019 for consultative assistance with management of her Subclinical hyperthyroidism   HISTORICAL SUMMARY: The patient was first diagnosed with subclinical hyperthyroidism when she was noted to have a low TSH at 0.321 uIU/mL with normal F4 during evaluation of fatigue in 04/2019   Sister with thyroid issues.  SUBJECTIVE:   During last visit (06/05/2019): Normal TFT's   Today (09/04/2019):  Faith Webb is here for a follow up on subclinical hyperthyroidism. Her weight has been stable. She denies any local neck symptoms   Dizzines has improved Denies diarrhea Has occasional palpitations and anxiety but no tremors.    ROS:  As per HPI.   HISTORY:  Past Medical History:  Past Medical History:  Diagnosis Date  . Allergy   . Arthritis   . Hyperlipidemia   . Hypertension    Past Surgical History:  Past Surgical History:  Procedure Laterality Date  . ABDOMINAL HYSTERECTOMY    . BREAST BIOPSY Bilateral   . BREAST SURGERY      Social History:  reports that she has never smoked. She has never used smokeless tobacco. She reports that she does not drink alcohol or use drugs. Family History:  Family History  Problem Relation Age of Onset  . Diabetes Mother   . Heart disease Mother   . Hypertension Mother   . Stroke Mother   . Diabetes Father   . Cancer Sister   . Diabetes Sister   . Diabetes Brother   . Hyperlipidemia Brother   . Stroke Brother   . Diabetes Sister   . Hyperlipidemia Sister   . Hypertension Sister   . Kidney disease Sister   . Stroke Sister   .  Diabetes Sister   . Hyperlipidemia Sister      HOME MEDICATIONS: Allergies as of 09/04/2019   No Known Allergies     Medication List       Accurate as of September 04, 2019 10:52 AM. If you have any questions, ask your nurse or doctor.        hydrochlorothiazide 25 MG tablet Commonly known as: HYDRODIURIL Take 1 tablet (25 mg total) by mouth daily.   levocetirizine 5 MG tablet Commonly known as: Xyzal Take 1 tablet (5 mg total) by mouth every evening.   Magnesium Oxide 420 MG Tabs Take 1 tablet by mouth daily.   meclizine 12.5 MG tablet Commonly known as: ANTIVERT Take 1 tablet (12.5 mg total) by mouth 3 (three) times daily as needed for dizziness.   metoprolol tartrate 25 MG tablet Commonly known as: LOPRESSOR Take 25 mg by mouth 2 (two) times daily.   omeprazole 20 MG capsule Commonly known as: PRILOSEC Take 20 mg by mouth daily as needed.   potassium chloride 10 MEQ tablet Commonly known as: KLOR-CON Take 3 tablets (30 mEq total) by mouth 2 (two) times daily.   rosuvastatin 10 MG tablet Commonly known as: Crestor Take 1 tablet (10 mg total) by mouth at bedtime.   The Very Finest Fish Oil Liqd Take by mouth.   Vitamin D (Ergocalciferol) 1.25  MG (50000 UT) Caps capsule Commonly known as: DRISDOL 1 po q week         OBJECTIVE:   PHYSICAL EXAM: VS: BP 118/64 (BP Location: Left Arm, Patient Position: Sitting, Cuff Size: Normal)   Pulse 69   Temp 98.8 F (37.1 C)   Ht 5\' 1"  (1.549 m)   Wt 141 lb 6.4 oz (64.1 kg)   SpO2 99%   BMI 26.72 kg/m    EXAM: General: Pt appears well and is in NAD  Ears, Nose, Throat: Hearing: Grossly intact bilaterally Dental: Good dentition  Throat: Clear without mass, erythema or exudate  Neck: General: Supple without adenopathy. Thyroid: Thyroid size normal.  No goiter or nodules appreciated. No thyroid bruit.  Lungs: Clear with good BS bilat with no rales, rhonchi, or wheezes  Heart: Auscultation: RRR.  Abdomen:  Normoactive bowel sounds, soft, nontender, without masses or organomegaly palpable  Extremities:  BL LE: No pretibial edema normal ROM and strength.  Neuro: Cranial nerves: II - XII grossly intact  Motor: Normal strength throughout DTRs: 2+ and symmetric in UE without delay in relaxation phase  Mental Status: Judgment, insight: Intact Orientation: Oriented to time, place, and person Mood and affect: No depression, anxiety, or agitation     DATA REVIEWED:  Results for Faith Webb, KALIS" (MRN [id number]) as of 09/04/2019 16:45  Ref. Range 05/08/2019 15:31 06/05/2019 08:31 07/17/2019 10:02 08/13/2019 10:39 09/04/2019 11:00  TSH Latest Ref Range: 0.35 - 4.50 uIU/mL 0.31 (L) 0.59 0.48  0.62  Triiodothyronine (T3) Latest Ref Range: 76 - 181 ng/dL  101     T4,Free(Direct) Latest Ref Range: 0.60 - 1.60 ng/dL  0.86 0.90  0.78   Results for Faith Webb, ODEKIRK" (MRN [id number]) as of 09/04/2019 07:32  Ref. Range 06/05/2019 08:31  TRAB Latest Ref Range: <=2.00 IU/L <1.00    ASSESSMENT / PLAN / RECOMMENDATIONS:   1. Subclinical Hyperthyroidism:   - Pt is clinically and biochemically euthyroid - She has one abnormal TSH reading but since then repeat testing showed normal TFT's. This could be erroneous lab result vs recovered thyroiditis.  - Repeat TF's today are normal   - Reassurance provided   F/U in PRN    Signed electronically by: Mack Guise, MD  [facility]  [facility] [address] [place], [place] [address] Phone: [phone] FAX: [phone]      CC: Ann Held, DO [address] [place] [address] Phone: [phone]  Fax: [phone]   Return to Endocrinology clinic as below: Future Appointments  Date Time Provider [place]  09/09/2019  9:10 AM Pieter Partridge, DO [facility] None  02/11/2020  9:20 AM Ann Held, DO [facility]

## 2019-09-04 NOTE — Telephone Encounter (Signed)
Medication: potassium chloride (K-DUR) 10 MEQ tablet [[id number]]   Has the patient contacted their pharmacy? Yes  (Agent: If no, request that the patient contact the pharmacy for the refill.) (Agent: If yes, when and what did the pharmacy advise?)  Preferred Pharmacy (with phone number or street name): [id number], [place] [phone] (Phone) [phone] (Fax)  Agent: Please be advised that RX refills may take up to 3 business days. We ask that you follow-up with your pharmacy.

## 2019-09-07 NOTE — Progress Notes (Signed)
NEUROLOGY CONSULTATION NOTE  Faith Webb MRN: [id number] DOB: [birth year]  Referring provider: Roma Schanz, DO Primary care provider: Roma Schanz, DO  Reason for consult:  Headache and dizziness  HISTORY OF PRESENT ILLNESS: Faith Webb is a 78 year old female with hypertension, hyperlipidemia, subclinical hyperthyroidism and arthritis who presents for headache and dizziness.  History supplemented by referring provider note.  She reports episodes of dizziness for about a year.  She has history of palpitations that have previously resolved but then started to recur last month.  She describes it as mostly lightheadedness, sometimes with trace sensation of movement.  She has associated blurred vision.  No nausea, vomiting, double vision, facial droop or numbness or weakness.  It lasts 5 to 10 minutes.  It occurs daily.  It usually occurs in the morning or standing in the kitchen preparing meals.  It is not aggravated by movement.  Turning off the light helps relieve symptoms.  She was also found to have subclinical hyperthyroidism in June, with low TSH of 0.321.  She has followed up with endocrinology and is not needing antithyroid medications.  She has followed up with cardiology and is on Lopressor.  She also reports dull periorbital nonthrobbing headache when she wakes up in the morning.  It typically lasts 10 minutes.  It occurs maybe once a week to once every other week.  It is separate from the dizziness.    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  Current NSAIDs:  ASA PRN Current analgesics:  none Current antihypertensive:  Lopressor 25mg  twice daily; HCTZ Current antihistamine:  Meclizine Current vitamins/supplements:  Magnesium oxide 420mg  daily Current antihistamine/decongestant:  Xyzal   PAST MEDICAL HISTORY:  Past Medical History:  Diagnosis Date  . Allergy   . Arthritis   . Hyperlipidemia   . Hypertension     PAST SURGICAL HISTORY: Past Surgical History:  Procedure Laterality Date  . ABDOMINAL HYSTERECTOMY    . BREAST BIOPSY Bilateral   . BREAST SURGERY      MEDICATIONS: Current Outpatient Medications on File Prior to Visit  Medication Sig Dispense Refill  . hydrochlorothiazide (HYDRODIURIL) 25 MG tablet Take 1 tablet (25 mg total) by mouth daily. 90 tablet 1  . levocetirizine (XYZAL) 5 MG tablet Take 1 tablet (5 mg total) by mouth every evening. 30 tablet 5  . Magnesium Oxide 420 MG TABS Take 1 tablet by mouth daily.     . meclizine (ANTIVERT) 12.5 MG tablet Take 1 tablet (12.5 mg total) by mouth 3 (three) times daily as needed for dizziness. 30 tablet 0  . metoprolol tartrate (LOPRESSOR) 25 MG tablet Take 25 mg by mouth 2 (two) times daily.    . Omega-3 Fatty Acids (THE VERY FINEST FISH OIL) LIQD Take by mouth.    Marland Kitchen omeprazole (PRILOSEC) 20 MG capsule Take 20 mg by mouth daily as needed.    . potassium chloride (KLOR-CON) 10 MEQ tablet Take 3 tablets (30 mEq total) by mouth 2 (two) times daily. 90 tablet 1  . rosuvastatin (CRESTOR) 10 MG tablet Take 1 tablet (10 mg total) by mouth at bedtime. 90 tablet 1  . Vitamin D, Ergocalciferol, (DRISDOL) 1.25 MG (50000 UT) CAPS capsule 1 po q week 12 capsule 3   No current facility-administered medications on file prior to visit.     ALLERGIES: No  Known Allergies  FAMILY HISTORY: Family History  Problem Relation Age of Onset  . Diabetes Mother   . Heart disease Mother   . Hypertension Mother   . Stroke Mother   . Diabetes Father   . Cancer Sister   . Diabetes Sister   . Diabetes Brother   . Hyperlipidemia Brother   . Stroke Brother   . Diabetes Sister   . Hyperlipidemia Sister   . Hypertension Sister   . Kidney disease Sister   . Stroke Sister   . Diabetes Sister   . Hyperlipidemia Sister    SOCIAL HISTORY: Social  History   Socioeconomic History  . Marital status: Widowed    Spouse name: Not on file  . Number of children: Not on file  . Years of education: Not on file  . Highest education level: Not on file  Occupational History  . Not on file  Social Needs  . Financial resource strain: Not on file  . Food insecurity    Worry: Not on file    Inability: Not on file  . Transportation needs    Medical: Not on file    Non-medical: Not on file  Tobacco Use  . Smoking status: Never Smoker  . Smokeless tobacco: Never Used  Substance and Sexual Activity  . Alcohol use: Never    Frequency: Never  . Drug use: Never  . Sexual activity: Not on file  Lifestyle  . Physical activity    Days per week: Not on file    Minutes per session: Not on file  . Stress: Not on file  Relationships  . Social Musician on phone: Not on file    Gets together: Not on file    Attends religious service: Not on file    Active member of club or organization: Not on file    Attends meetings of clubs or organizations: Not on file    Relationship status: Not on file  . Intimate partner violence    Fear of current or ex partner: Not on file    Emotionally abused: Not on file    Physically abused: Not on file    Forced sexual activity: Not on file  Other Topics Concern  . Not on file  Social History Narrative  . Not on file    REVIEW OF SYSTEMS: Constitutional: No fevers, chills, or sweats, no generalized fatigue, change in appetite Eyes: No visual changes, double vision, eye pain Ear, nose and throat: No hearing loss, ear pain, nasal congestion, sore throat Cardiovascular: No chest pain, palpitations Respiratory:  No shortness of breath at rest or with exertion, wheezes GastrointestinaI: No nausea, vomiting, diarrhea, abdominal pain, fecal incontinence Genitourinary:  No dysuria, urinary retention or frequency Musculoskeletal:  No neck pain, back pain Integumentary: No rash, pruritus, skin  lesions Neurological: as above Psychiatric: No depression, insomnia, anxiety Endocrine: No palpitations, fatigue, diaphoresis, mood swings, change in appetite, change in weight, increased thirst Hematologic/Lymphatic:  No purpura, petechiae. Allergic/Immunologic: no itchy/runny eyes, nasal congestion, recent allergic reactions, rashes  PHYSICAL EXAM: Blood pressure 137/82, pulse 72, height 5\' 1"  (1.549 m), weight 142 lb (64.4 kg), SpO2 98 %. General: No acute distress.  Patient appears well-groomed.   Head:  Normocephalic/atraumatic Eyes:  fundi examined but not visualized Neck: supple, no paraspinal tenderness, full range of motion Back: No paraspinal tenderness Heart: regular rate and rhythm Lungs: Clear to auscultation bilaterally. Vascular: No carotid bruits. Neurological Exam: Mental status: alert and oriented to  person, place, and time, recent and remote memory intact, fund of knowledge intact, attention and concentration intact, speech fluent and not dysarthric, language intact. Cranial nerves: CN I: not tested CN II: pupils equal, round and reactive to light, visual fields intact CN III, IV, VI:  full range of motion, no nystagmus, bilateral ptosis CN V: facial sensation intact CN VII: upper and lower face symmetric CN VIII: hearing intact CN IX, X: gag intact, uvula midline CN XI: sternocleidomastoid and trapezius muscles intact CN XII: tongue midline Bulk & Tone: normal, no fasciculations. Motor:  5/5 throughout  Sensation:  Temperature and vibration sensation intact.  Deep Tendon Reflexes:  1+ throughout, toes downgoing.   Finger to nose testing:  Without dysmetria.   Heel to shin:  Without dysmetria.   Gait:  Normal station and stride.  Able to turn. Romberg negative.  IMPRESSION: 1.  Dizziness, vague.  No clear neurologic etiology 2.  2mm left paraophthalmic artery aneurysm, incidental finding.  3.  Mild episodic headache, likely tension-type, manageable  PLAN:  1.  MRA of head ordered in March by PCP 2.  She will follow up after repeat MRA  40 minutes spent face to face with patient, over 50% spent discussing management.   Thank you for allowing me to take part in the care of this patient.  Shon MilletAdam Jaffe, DO  CC:  Seabron SpatesYvonne Lowne Chase, DO

## 2019-09-09 ENCOUNTER — Other Ambulatory Visit: Payer: Self-pay

## 2019-09-09 ENCOUNTER — Encounter: Payer: Self-pay | Admitting: Neurology

## 2019-09-09 ENCOUNTER — Ambulatory Visit: Payer: Medicare Other | Admitting: Neurology

## 2019-09-09 VITALS — BP 137/82 | HR 72 | Ht 61.0 in | Wt 142.0 lb

## 2019-09-09 DIAGNOSIS — R42 Dizziness and giddiness: Secondary | ICD-10-CM | POA: Diagnosis not present

## 2019-09-09 DIAGNOSIS — I671 Cerebral aneurysm, nonruptured: Secondary | ICD-10-CM | POA: Diagnosis not present

## 2019-09-09 DIAGNOSIS — G44219 Episodic tension-type headache, not intractable: Secondary | ICD-10-CM | POA: Diagnosis not present

## 2019-09-09 NOTE — Patient Instructions (Signed)
Repeat MRA of head in 6 months Follow up with me after repeat MRA of head

## 2019-10-06 ENCOUNTER — Other Ambulatory Visit: Payer: Self-pay | Admitting: Family Medicine

## 2019-10-06 MED ORDER — POTASSIUM CHLORIDE ER 10 MEQ PO TBCR: 30.0000 meq | EXTENDED_RELEASE_TABLET | Freq: Two times a day (BID) | ORAL | 1 refills | Status: DC

## 2019-10-06 NOTE — Telephone Encounter (Signed)
Medication Refill - Medication: Potassium.   Has the patient contacted their pharmacy? No. Pt states she is completely out of this medication. PT is requesting longer than a 15 day supply. Please advise.  (Agent: If no, request that the patient contact the pharmacy for the refill.) (Agent: If yes, when and what did the pharmacy advise?)  Preferred Pharmacy (with phone number or street name):  [facility] [place], [address]  [place] [address]  Phone: [phone] Fax: [phone]  Not a 24 hour pharmacy; exact hours not known.     Agent: Please be advised that RX refills may take up to 3 business days. We ask that you follow-up with your pharmacy.

## 2019-10-06 NOTE — Telephone Encounter (Signed)
Requested medication (s) are due for refill today: yes  Requested medication (s) are on the active medication list: yes  Last refill:  09/04/2019  Future visit scheduled: yes  Notes to clinic: patient requesting more than a 15 day supply   Requested Prescriptions  Pending Prescriptions Disp Refills   potassium chloride (KLOR-CON) 10 MEQ tablet 90 tablet 1    Sig: Take 3 tablets (30 mEq total) by mouth 2 (two) times daily.     Endocrinology:  Minerals - Potassium Supplementation Passed - 10/06/2019  2:54 PM      Passed - K in normal range and within 360 days    Potassium  Date Value Ref Range Status  08/13/2019 4.0 3.5 - 5.1 mEq/L Final         Passed - Cr in normal range and within 360 days    Creat  Date Value Ref Range Status  05/08/2019 0.99 (H) 0.60 - 0.93 mg/dL Final    Comment:    For patients >87 years of age, the reference limit for Creatinine is approximately 13% higher for people identified as African-American. .    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

## 2019-11-05 ENCOUNTER — Other Ambulatory Visit: Payer: Self-pay | Admitting: Family Medicine

## 2019-11-05 ENCOUNTER — Other Ambulatory Visit: Payer: Self-pay

## 2019-11-05 MED ORDER — POTASSIUM CHLORIDE ER 10 MEQ PO TBCR: 30.0000 meq | EXTENDED_RELEASE_TABLET | Freq: Two times a day (BID) | ORAL | 0 refills | Status: DC

## 2019-11-05 NOTE — Telephone Encounter (Signed)
Patient called in stating she is wanting to know if she can have more quantity of her potassium chloride (KLOR-CON) 10 MEQ tablet than on previous prescriptions. Pt states she feels as if she is always running out. Did notify pt she should have 1 refill at pharmacy and pt is wondering is quantity can be increased on that one as well. Please advise.

## 2019-11-05 NOTE — Telephone Encounter (Signed)
Refill sent.

## 2019-11-06 MED ORDER — POTASSIUM CHLORIDE ER 10 MEQ PO TBCR: 30.0000 meq | EXTENDED_RELEASE_TABLET | Freq: Two times a day (BID) | ORAL | 5 refills | Status: DC

## 2019-11-06 NOTE — Telephone Encounter (Signed)
Patient called back and stated that she is wanting a 90 days supply of potassium chloride sent to pharmacy. Patient requesting callback once medication has been sent over.

## 2019-11-06 NOTE — Telephone Encounter (Signed)
Requested medication (s) are due for refill today: yes  Requested medication (s) are on the active medication list: yes  Last refill:  11/05/2019  Future visit scheduled: yes  Notes to clinic:  Patient would like a 90 day supply. Patient would like a call once sent   Requested Prescriptions  Pending Prescriptions Disp Refills   potassium chloride (KLOR-CON) 10 MEQ tablet 180 tablet 0    Sig: Take 3 tablets (30 mEq total) by mouth 2 (two) times daily.      Endocrinology:  Minerals - Potassium Supplementation Passed - 11/06/2019 10:50 AM      Passed - K in normal range and within 360 days    Potassium  Date Value Ref Range Status  08/13/2019 4.0 3.5 - 5.1 mEq/L Final          Passed - Cr in normal range and within 360 days    Creat  Date Value Ref Range Status  05/08/2019 0.99 (H) 0.60 - 0.93 mg/dL Final    Comment:    For patients >7 years of age, the reference limit for Creatinine is approximately 13% higher for people identified as African-American. .    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

## 2019-12-06 ENCOUNTER — Ambulatory Visit: Payer: Medicare Other | Attending: Internal Medicine

## 2019-12-06 DIAGNOSIS — Z23 Encounter for immunization: Secondary | ICD-10-CM | POA: Insufficient documentation

## 2019-12-06 NOTE — Progress Notes (Signed)
   Covid-19 Vaccination Clinic  Name:  Faith Webb    MRN: [id number] DOB: 05/08/1941  12/06/2019  [name] was observed post Covid-19 immunization for 15 minutes without incidence. She was provided with Vaccine Information Sheet and instruction to access the V-Safe system.   [name] was instructed to call 911 with any severe reactions post vaccine: Marland Kitchen Difficulty breathing  . Swelling of your face and throat  . A fast heartbeat  . A bad rash all over your body  . Dizziness and weakness    Immunizations Administered    Name Date Dose VIS Date Route   Pfizer COVID-19 Vaccine 12/06/2019  1:48 PM 0.3 mL 11/06/2019 Intramuscular   Manufacturer: [employer], [employer]   Lot: [id number]   NDC: [address]-6

## 2019-12-21 ENCOUNTER — Other Ambulatory Visit: Payer: Self-pay | Admitting: *Deleted

## 2019-12-21 MED ORDER — POTASSIUM CHLORIDE ER 10 MEQ PO TBCR: 30.0000 meq | EXTENDED_RELEASE_TABLET | Freq: Two times a day (BID) | ORAL | 1 refills | Status: DC

## 2019-12-27 ENCOUNTER — Ambulatory Visit: Payer: Medicare PPO | Attending: Internal Medicine

## 2019-12-27 DIAGNOSIS — Z23 Encounter for immunization: Secondary | ICD-10-CM | POA: Insufficient documentation

## 2019-12-27 NOTE — Progress Notes (Signed)
   Covid-19 Vaccination Clinic  Name:  Faith Webb    MRN: [id number] DOB: 10-Dec-1940  12/27/2019  [name] was observed post Covid-19 immunization for 15 minutes without incidence. She was provided with Vaccine Information Sheet and instruction to access the V-Safe system.   [name] was instructed to call 911 with any severe reactions post vaccine: Marland Kitchen Difficulty breathing  . Swelling of your face and throat  . A fast heartbeat  . A bad rash all over your body  . Dizziness and weakness    Immunizations Administered    Name Date Dose VIS Date Route   Pfizer COVID-19 Vaccine 12/27/2019 10:54 AM 0.3 mL 11/06/2019 Intramuscular   Manufacturer: [employer], [employer]   Lot: [id number]   NDC: [id number]

## 2020-01-28 ENCOUNTER — Ambulatory Visit: Payer: Medicare PPO | Admitting: Family Medicine

## 2020-01-28 ENCOUNTER — Other Ambulatory Visit: Payer: Self-pay

## 2020-01-28 ENCOUNTER — Encounter: Payer: Self-pay | Admitting: Family Medicine

## 2020-01-28 VITALS — BP 120/68 | HR 78 | Temp 97.1°F | Resp 18 | Ht 61.0 in | Wt 145.8 lb

## 2020-01-28 DIAGNOSIS — H60393 Other infective otitis externa, bilateral: Secondary | ICD-10-CM

## 2020-01-28 DIAGNOSIS — H6123 Impacted cerumen, bilateral: Secondary | ICD-10-CM | POA: Insufficient documentation

## 2020-01-28 DIAGNOSIS — R42 Dizziness and giddiness: Secondary | ICD-10-CM

## 2020-01-28 DIAGNOSIS — I10 Essential (primary) hypertension: Secondary | ICD-10-CM

## 2020-01-28 DIAGNOSIS — E0590 Thyrotoxicosis, unspecified without thyrotoxic crisis or storm: Secondary | ICD-10-CM | POA: Insufficient documentation

## 2020-01-28 DIAGNOSIS — I671 Cerebral aneurysm, nonruptured: Secondary | ICD-10-CM

## 2020-01-28 DIAGNOSIS — H81399 Other peripheral vertigo, unspecified ear: Secondary | ICD-10-CM

## 2020-01-28 DIAGNOSIS — H9193 Unspecified hearing loss, bilateral: Secondary | ICD-10-CM

## 2020-01-28 DIAGNOSIS — E785 Hyperlipidemia, unspecified: Secondary | ICD-10-CM | POA: Diagnosis not present

## 2020-01-28 DIAGNOSIS — H6090 Unspecified otitis externa, unspecified ear: Secondary | ICD-10-CM | POA: Insufficient documentation

## 2020-01-28 LAB — COMPREHENSIVE METABOLIC PANEL
ALT: 20 U/L (ref 0–35)
AST: 15 U/L (ref 0–37)
Albumin: 4.1 g/dL (ref 3.5–5.2)
Alkaline Phosphatase: 50 U/L (ref 39–117)
BUN: 15 mg/dL (ref 6–23)
CO2: 33 mEq/L — ABNORMAL HIGH (ref 19–32)
Calcium: 9.8 mg/dL (ref 8.4–10.5)
Chloride: 102 mEq/L (ref 96–112)
Creatinine, Ser: 0.95 mg/dL (ref 0.40–1.20)
GFR: 68.66 mL/min (ref >60.00)
Glucose, Bld: 94 mg/dL (ref 70–99)
Potassium: 3.9 mEq/L (ref 3.5–5.1)
Sodium: 141 mEq/L (ref 135–145)
Total Bilirubin: 0.6 mg/dL (ref 0.2–1.2)
Total Protein: 6.8 g/dL (ref 6.0–8.3)

## 2020-01-28 LAB — LIPID PANEL
Cholesterol: 128 mg/dL (ref 0–200)
HDL: 55.8 mg/dL (ref >39.00)
LDL Cholesterol: 46 mg/dL (ref 0–99)
NonHDL: 72.34
Total CHOL/HDL Ratio: 2
Triglycerides: 132 mg/dL (ref 0.0–149.0)
VLDL: 26.4 mg/dL (ref 0.0–40.0)

## 2020-01-28 LAB — TSH: TSH: 0.61 u[IU]/mL (ref 0.35–4.50)

## 2020-01-28 MED ORDER — OFLOXACIN 0.3 % OT SOLN: 10.0000 [drp] | Freq: Every day | OTIC | 0 refills | Status: DC

## 2020-01-28 MED ORDER — MECLIZINE HCL 12.5 MG PO TABS: 12.5000 mg | ORAL_TABLET | Freq: Three times a day (TID) | ORAL | 0 refills | Status: DC | PRN

## 2020-01-28 NOTE — Assessment & Plan Note (Signed)
Well controlled, no changes to meds. Encouraged heart healthy diet such as the DASH diet and exercise as tolerated.  °

## 2020-01-28 NOTE — Patient Instructions (Signed)
Earwax Buildup, Adult The ears produce a substance called earwax that helps keep bacteria out of the ear and protects the skin in the ear canal. Occasionally, earwax can build up in the ear and cause discomfort or hearing loss. What increases the risk? This condition is more likely to develop in people who:  Are female.  Are elderly.  Naturally produce more earwax.  Clean their ears often with cotton swabs.  Use earplugs often.  Use in-ear headphones often.  Wear hearing aids.  Have narrow ear canals.  Have earwax that is overly thick or sticky.  Have eczema.  Are dehydrated.  Have excess hair in the ear canal. What are the signs or symptoms? Symptoms of this condition include:  Reduced or muffled hearing.  A feeling of fullness in the ear or feeling that the ear is plugged.  Fluid coming from the ear.  Ear pain.  Ear itch.  Ringing in the ear.  Coughing.  An obvious piece of earwax that can be seen inside the ear canal. How is this diagnosed? This condition may be diagnosed based on:  Your symptoms.  Your medical history.  An ear exam. During the exam, your health care provider will look into your ear with an instrument called an otoscope. You may have tests, including a hearing test. How is this treated? This condition may be treated by:  Using ear drops to soften the earwax.  Having the earwax removed by a health care provider. The health care provider may: ? Flush the ear with water. ? Use an instrument that has a loop on the end (curette). ? Use a suction device.  Surgery to remove the wax buildup. This may be done in severe cases. Follow these instructions at home:   Take over-the-counter and prescription medicines only as told by your health care provider.  Do not put any objects, including cotton swabs, into your ear. You can clean the opening of your ear canal with a washcloth or facial tissue.  Follow instructions from your health care  provider about cleaning your ears. Do not over-clean your ears.  Drink enough fluid to keep your urine clear or pale yellow. This will help to thin the earwax.  Keep all follow-up visits as told by your health care provider. If earwax builds up in your ears often or if you use hearing aids, consider seeing your health care provider for routine, preventive ear cleanings. Ask your health care provider how often you should schedule your cleanings.  If you have hearing aids, clean them according to instructions from the manufacturer and your health care provider. Contact a health care provider if:  You have ear pain.  You develop a fever.  You have blood, pus, or other fluid coming from your ear.  You have hearing loss.  You have ringing in your ears that does not go away.  Your symptoms do not improve with treatment.  You feel like the room is spinning (vertigo). Summary  Earwax can build up in the ear and cause discomfort or hearing loss.  The most common symptoms of this condition include reduced or muffled hearing and a feeling of fullness in the ear or feeling that the ear is plugged.  This condition may be diagnosed based on your symptoms, your medical history, and an ear exam.  This condition may be treated by using ear drops to soften the earwax or by having the earwax removed by a health care provider.  Do not put any   objects, including cotton swabs, into your ear. You can clean the opening of your ear canal with a washcloth or facial tissue. This information is not intended to replace advice given to you by your health care provider. Make sure you discuss any questions you have with your health care provider. Document Revised: 10/25/2017 Document Reviewed: 01/23/2017 Elsevier Patient Education  2020 [employer]vier [employer].  

## 2020-01-28 NOTE — Assessment & Plan Note (Signed)
Ears irrigated successfully Canals red---  floxin drops

## 2020-01-28 NOTE — Assessment & Plan Note (Signed)
floxin gtts both ears

## 2020-01-28 NOTE — Assessment & Plan Note (Signed)
Recheck MRA F/u neuro

## 2020-01-28 NOTE — Assessment & Plan Note (Addendum)
Refill antivert  Repeat mra to be done with neuro f/u Both ears were impacted with cerumen ---- may contribute to vertigo Will refer for hearing test

## 2020-01-28 NOTE — Progress Notes (Signed)
Patient ID: Faith Webb, female    DOB: 11-Jun-1941  Age: 79 y.o. MRN: [id number]    Subjective:  Subjective  HPI Faith Webb presents for f/u chol and bp  She feels like the dizziness is worse    She hs seen endo, cardiology and neuro.  Neuro wanted to see her back after the MRA was repeated   Review of Systems  Constitutional: Negative for appetite change, chills, diaphoresis, fatigue, fever and unexpected weight change.  HENT: Negative for congestion and hearing loss.   Eyes: Negative for pain, discharge, redness and visual disturbance.  Respiratory: Negative for cough, chest tightness, shortness of breath and wheezing.   Cardiovascular: Negative for chest pain, palpitations and leg swelling.  Gastrointestinal: Negative for abdominal pain, blood in stool, constipation, diarrhea, nausea and vomiting.  Endocrine: Negative for cold intolerance, heat intolerance, polydipsia, polyphagia and polyuria.  Genitourinary: Negative for difficulty urinating, dysuria, frequency, hematuria and urgency.  Musculoskeletal: Negative for back pain and myalgias.  Skin: Negative for rash.  Allergic/Immunologic: Negative for environmental allergies.  Neurological: Positive for dizziness. Negative for weakness, light-headedness, numbness and headaches.  Hematological: Does not bruise/bleed easily.  Psychiatric/Behavioral: Negative for suicidal ideas. The patient is not nervous/anxious.     History Past Medical History:  Diagnosis Date  . Allergy   . Arthritis   . Hyperlipidemia   . Hypertension     She has a past surgical history that includes Abdominal hysterectomy; Breast surgery; and Breast biopsy (Bilateral).   Her family history includes Cancer in her sister; Diabetes in her brother, father, mother, sister, sister, and sister; Healthy in her child; Heart disease in her mother; Hyperlipidemia in her brother, sister, and sister; Hypertension in her mother and sister; Kidney disease in her sister;  Stroke in her brother, mother, and sister.She reports that she has never smoked. She has never used smokeless tobacco. She reports that she does not drink alcohol or use drugs.  Current Outpatient Medications on File Prior to Visit  Medication Sig Dispense Refill  . hydrochlorothiazide (HYDRODIURIL) 25 MG tablet Take 1 tablet (25 mg total) by mouth daily. 90 tablet 1  . levocetirizine (XYZAL) 5 MG tablet Take 1 tablet (5 mg total) by mouth every evening. 30 tablet 5  . Magnesium Oxide 420 MG TABS Take 0.5 tablets by mouth daily.     . metoprolol tartrate (LOPRESSOR) 25 MG tablet Take 25 mg by mouth 2 (two) times daily.    . Omega-3 Fatty Acids (THE VERY FINEST FISH OIL) LIQD Take by mouth.    Marland Kitchen omeprazole (PRILOSEC) 20 MG capsule Take 20 mg by mouth daily as needed.    . potassium chloride (KLOR-CON) 10 MEQ tablet Take 3 tablets (30 mEq total) by mouth 2 (two) times daily. 270 tablet 1  . rosuvastatin (CRESTOR) 10 MG tablet Take 1 tablet (10 mg total) by mouth at bedtime. 90 tablet 1  . Vitamin D, Ergocalciferol, (DRISDOL) 1.25 MG (50000 UT) CAPS capsule 1 po q week 12 capsule 3   No current facility-administered medications on file prior to visit.     Objective:  Objective  Physical Exam Vitals and nursing note reviewed.  Constitutional:      Appearance: She is well-developed.  HENT:     Head: Normocephalic and atraumatic.     Right Ear: There is impacted cerumen.     Left Ear: There is impacted cerumen.     Ears:   Eyes:     Conjunctiva/sclera: Conjunctivae normal.  Neck:     Thyroid: No thyromegaly.     Vascular: No carotid bruit or JVD.  Cardiovascular:     Rate and Rhythm: Normal rate and regular rhythm.     Heart sounds: Normal heart sounds. No murmur.  Pulmonary:     Effort: Pulmonary effort is normal. No respiratory distress.     Breath sounds: Normal breath sounds. No wheezing or rales.  Chest:     Chest wall: No tenderness.  Musculoskeletal:     Cervical back:  Normal range of motion and neck supple.  Neurological:     Mental Status: She is alert and oriented to person, place, and time.    BP 120/68 (BP Location: Right Arm, Patient Position: Sitting, Cuff Size: Normal)   Pulse 78   Temp (!) 97.1 F (36.2 C) (Temporal)   Resp 18   Ht 5\' 1"  (1.549 m)   Wt 145 lb 12.8 oz (66.1 kg)   SpO2 96%   BMI 27.55 kg/m  Wt Readings from Last 3 Encounters:  01/28/20 145 lb 12.8 oz (66.1 kg)  09/09/19 142 lb (64.4 kg)  09/04/19 141 lb 6.4 oz (64.1 kg)     Lab Results  Component Value Date   WBC 5.4 05/08/2019   HGB 13.8 05/08/2019   HCT 40.0 05/08/2019   PLT 304 05/08/2019   GLUCOSE 70 08/13/2019   CHOL 135 08/13/2019   TRIG 120.0 08/13/2019   HDL 59.70 08/13/2019   LDLCALC 52 08/13/2019   ALT 18 08/13/2019   AST 13 08/13/2019   NA 142 08/13/2019   K 4.0 08/13/2019   CL 104 08/13/2019   CREATININE 1.05 08/13/2019   BUN 18 08/13/2019   CO2 32 08/13/2019   TSH 0.62 09/04/2019    DG Bone Density  Result Date: 08/26/2019 EXAM: DUAL X-RAY ABSORPTIOMETRY (DXA) FOR BONE MINERAL DENSITY IMPRESSION: 08/28/2019 LOWNE CHASE Your patient Faith Webb completed a BMD test on 08/26/2019 using the Lunar IDXA DXA System (analysis version: 16.SP2) manufactured by 08/28/2019. The following summarizes the results of our evaluation. CAK PATIENT: Name: Faith Webb, Faith Webb Patient ID: Ria Clock Birth Date: [birth year] Height: 61.0 in. Gender: Female Measured: 08/26/2019 Weight: 139.0 lbs. Indications: Advanced Age, African-American, Estrogen Deficiency, History of Osteopenia, Hysterectomy, Low Calcium Intake, Post Menopausal Fractures: Treatments: Multivitamin, Vitamin D ASSESSMENT: The BMD measured at Femur Neck Right is 0.890 g/cm2 with a T-score of -1.1. This patient is considered OSTEOPENIC according to World Health Organization [facility]) criteria. Scan quality was good. Site Region Measured Date Measured Age WHO YA BMD Classification T-score AP Spine L1-L4 08/26/2019 [age]  Normal 0.8 1.279 g/cm2 DualFemur Neck Right 8/26/2019 [age] Osteopenia -1.1 0.890 g/cm2 World Health Organization [facility]) criteria for post-menopausal, Caucasian Women: Normal       T-score at or above -1 SD Osteopenia   T-score between -1 and -2.5 SD Osteoporosis T-score at or below -2.5 SD RECOMMENDATION: 1. All patients should optimize calcium and vitamin D intake. 2. Consider FDA-approved medical therapies in postmenopausal women and men aged 84 years and older, based on the following: a. A hip or vertebral(clinical or morphometric) fracture. b. T-Score < -2.5 at the femoral neck or spine after appropriate evaluation to exclude secondary causes c. Low bone mass (T-score between -1.0 and -2.5 at the femoral neck or spine) and a 10 year probability of a hip fracture >3% or a 10 year probability of major osteoporosis-related fracture > 20% based on the [place]-adapted WHO algorithm d. Clinical judgement and/or patient  preferences may indicate treatment for people with 10-year fracture probabilities above or below these levels FOLLOW-UP: Patients with diagnosis of osteoporosis or at high risk for fracture should have regular bone mineral density tests. For patients eligible for Medicare, routine testing is allowed once every 2 years. The testing frequency can be increased to one year for patients who have rapidly progressing disease, those who are receiving or discontinuing medical therapy to restore bone mass, or have additional risk factors. I have reviewed this report and agree with the above findings. Mark A. Thornton Papas, M.D. [facility] Patient: Faith Webb Referring Physician: Rosalita Chessman CHASE Birth Date: 1941/01/21 [age] Patient ID: [id number] Height: 61.0 in. Weight: 139.0 lbs. Measured: 08/26/2019 2:26:08 PM (16 SP 2) Gender: Female Ethnicity: Black Analyzed: 08/26/2019 2:36:52 PM (16 SP 2) FRAX* 10-year Probability of Fracture Based on femoral neck BMD: DualFemur (Right) Major  Osteoporotic Fracture: 5.1% Hip Fracture:                0.9% Population:                  [place] (Black) Risk Factors:                None *FRAX is a Materials engineer of the [employer] for Metabolic Bone Disease, a [place] (WHO) [facility]. ASSESSMENT: The probability of a major osteoporotic fracture is 5.1% within the next ten years. The probability of a hip fracture is 0.9% within the next ten years. I have reviewed this report and agree with the above findings. Mark A. Thornton Papas, M.D. [facility] Electronically Signed   By: Lavonia Dana M.D.   On: 08/26/2019 14:50   MM 3D SCREEN BREAST BILATERAL  Result Date: 08/26/2019 CLINICAL DATA:  Screening. EXAM: DIGITAL SCREENING BILATERAL MAMMOGRAM WITH TOMO AND CAD COMPARISON:  Previous exam(s). ACR Breast Density Category c: The breast tissue is heterogeneously dense, which may obscure small masses. FINDINGS: There are no findings suspicious for malignancy. Images were processed with CAD. IMPRESSION: No mammographic evidence of malignancy. A result letter of this screening mammogram will be mailed directly to the patient. RECOMMENDATION: Screening mammogram in one year. (Code:SM-B-01Y) BI-RADS CATEGORY  1: Negative. Electronically Signed   By: Marin Olp M.D.   On: 08/26/2019 14:27     Assessment & Plan:  Plan  I am having Faith Webb "[name]" start on ofloxacin. I am also having her maintain her metoprolol tartrate, The Very Finest Fish Oil, Magnesium Oxide, levocetirizine, hydrochlorothiazide, Vitamin D (Ergocalciferol), rosuvastatin, omeprazole, potassium chloride, and meclizine.  Meds ordered this encounter  Medications  . meclizine (ANTIVERT) 12.5 MG tablet    Sig: Take 1 tablet (12.5 mg total) by mouth 3 (three) times daily as needed for dizziness.    Dispense:  30 tablet    Refill:  0  . ofloxacin (FLOXIN OTIC) 0.3 % OTIC solution    Sig: Place 10 drops into both ears daily.     Dispense:  10 mL    Refill:  0    Problem List Items Addressed This Visit      Unprioritized   Bilateral impacted cerumen    Ears irrigated successfully Canals red---  floxin drops        Dizziness    Refill antivert  Repeat mra to be done with neuro f/u Both ears were impacted with cerumen ---- may contribute to vertigo Will refer for hearing  test        Relevant Orders   Ambulatory referral to Audiology   Hyperlipidemia - Primary    Tolerating statin, encouraged heart healthy diet, avoid trans fats, minimize simple carbs and saturated fats. Increase exercise as tolerated      Relevant Orders   Lipid panel   Hypertension    Well controlled, no changes to meds. Encouraged heart healthy diet such as the DASH diet and exercise as tolerated.       Relevant Orders   Comprehensive metabolic panel   Nonruptured cerebral aneurysm    Recheck MRA F/u neuro       Relevant Orders   MR Angiogram Head Wo Contrast   Otitis externa    floxin gtts both ears       Relevant Medications   ofloxacin (FLOXIN OTIC) 0.3 % OTIC solution   Subclinical hyperthyroidism   Relevant Orders   TSH    Other Visit Diagnoses    Other peripheral vertigo, unspecified ear       Relevant Medications   meclizine (ANTIVERT) 12.5 MG tablet   Other Relevant Orders   MR Angiogram Head Wo Contrast   Bilateral hearing loss, unspecified hearing loss type       Relevant Orders   Ambulatory referral to Audiology      Follow-up: Return in about 6 months (around 07/30/2020) for annual exam, fasting.  [name], DO

## 2020-01-28 NOTE — Assessment & Plan Note (Signed)
Tolerating statin, encouraged heart healthy diet, avoid trans fats, minimize simple carbs and saturated fats. Increase exercise as tolerated 

## 2020-02-06 ENCOUNTER — Ambulatory Visit (HOSPITAL_BASED_OUTPATIENT_CLINIC_OR_DEPARTMENT_OTHER)
Admission: RE | Admit: 2020-02-06 | Discharge: 2020-02-06 | Disposition: A | Discharge status: Home / Self Care | Payer: Medicare PPO | Source: Ambulatory Visit | Attending: Family Medicine | Admitting: Family Medicine

## 2020-02-06 ENCOUNTER — Other Ambulatory Visit: Payer: Self-pay

## 2020-02-06 DIAGNOSIS — I671 Cerebral aneurysm, nonruptured: Secondary | ICD-10-CM | POA: Insufficient documentation

## 2020-02-06 DIAGNOSIS — H81399 Other peripheral vertigo, unspecified ear: Secondary | ICD-10-CM | POA: Diagnosis not present

## 2020-02-08 NOTE — Progress Notes (Signed)
Nurse connected with patient 02/09/20 at 10:15 AM EDT by a telephone enabled telemedicine application and verified that I am speaking with the correct person using two identifiers. Patient stated full name and DOB. Patient gave permission to continue with virtual visit. Patient's location was at home and Nurse's location was at [place] office.   Subjective:   Faith Webb is a 79 y.o. female who presents for an Initial Medicare Annual Wellness Visit.  Review of Systems   No ROS.  Medicare Wellness Virtual Visit.  Visual/audio telehealth visit, UTA vital signs.   See social history for additional risk factors.  Home Safety/Smoke Alarms: Feels safe in home. Smoke alarms in place.  Lives alone in 1 story w/ full basement. Talks to dtr daily.  Female:        Mammo-  08/26/19     Dexa scan-  08/26/19      CCS- 12/16/12 Eye- [facility] yearly per pt.   Objective:    Advanced Directives 02/09/2020 09/09/2019 05/11/2019 08/24/2018  Does Patient Have a Medical Advance Directive? Yes Yes Yes No  Type of Estate agent of [place];Living will Living will;Healthcare Power of Attorney - -  Does patient want to make changes to medical advance directive? No - Patient declined - No - Patient declined -  Copy of Healthcare Power of Attorney in Chart? No - copy requested - - -  Would patient like information on creating a medical advance directive? - - - No - Patient declined    Current Medications (verified) Outpatient Encounter Medications as of 02/09/2020  Medication Sig  . hydrochlorothiazide (HYDRODIURIL) 25 MG tablet Take 1 tablet (25 mg total) by mouth daily.  Marland Kitchen levocetirizine (XYZAL) 5 MG tablet Take 1 tablet (5 mg total) by mouth every evening.  . Magnesium Oxide 420 MG TABS Take 0.5 tablets by mouth daily.   . meclizine (ANTIVERT) 12.5 MG tablet Take 1 tablet (12.5 mg total) by mouth 3 (three) times daily as needed for dizziness.  . metoprolol tartrate (LOPRESSOR)  25 MG tablet Take 25 mg by mouth 2 (two) times daily.  Marland Kitchen ofloxacin (FLOXIN OTIC) 0.3 % OTIC solution Place 10 drops into both ears daily.  . Omega-3 Fatty Acids (THE VERY FINEST FISH OIL) LIQD Take by mouth.  Marland Kitchen omeprazole (PRILOSEC) 20 MG capsule Take 20 mg by mouth daily as needed.  . potassium chloride (KLOR-CON) 10 MEQ tablet Take 3 tablets (30 mEq total) by mouth 2 (two) times daily.  . rosuvastatin (CRESTOR) 10 MG tablet Take 1 tablet (10 mg total) by mouth at bedtime.  . Vitamin D, Ergocalciferol, (DRISDOL) 1.25 MG (50000 UT) CAPS capsule 1 po q week   No facility-administered encounter medications on file as of 02/09/2020.    Allergies (verified) Patient has no known allergies.   History: Past Medical History:  Diagnosis Date  . Allergy   . Arthritis   . Hyperlipidemia   . Hypertension    Past Surgical History:  Procedure Laterality Date  . ABDOMINAL HYSTERECTOMY    . BREAST BIOPSY Bilateral   . BREAST SURGERY     Family History  Problem Relation Age of Onset  . Diabetes Mother   . Heart disease Mother   . Hypertension Mother   . Stroke Mother   . Diabetes Father   . Cancer Sister   . Diabetes Sister   . Diabetes Brother   . Hyperlipidemia Brother   . Stroke Brother   . Diabetes Sister   .  Hyperlipidemia Sister   . Hypertension Sister   . Kidney disease Sister   . Stroke Sister   . Diabetes Sister   . Hyperlipidemia Sister   . Healthy Child    Social History   Socioeconomic History  . Marital status: Widowed    Spouse name: Not on file  . Number of children: 2  . Years of education: Not on file  . Highest education level: Bachelor's degree (e.g., BA, AB, BS)  Occupational History  . Not on file  Tobacco Use  . Smoking status: Never Smoker  . Smokeless tobacco: Never Used  Substance and Sexual Activity  . Alcohol use: Never  . Drug use: Never  . Sexual activity: Not on file  Other Topics Concern  . Not on file  Social History Narrative   Pt  lives alone in 1 story home, she has 2 children, right handed, she drinks coffee daily, tea/ soda occasionally.   Social Determinants of Health   Financial Resource Strain: Low Risk   . Difficulty of Paying Living Expenses: Not hard at all  Food Insecurity: No Food Insecurity  . Worried About Charity fundraiser in the Last Year: Never true  . Ran Out of Food in the Last Year: Never true  Transportation Needs: No Transportation Needs  . Lack of Transportation (Medical): No  . Lack of Transportation (Non-Medical): No  Physical Activity:   . Days of Exercise per Week:   . Minutes of Exercise per Session:   Stress:   . Feeling of Stress :   Social Connections:   . Frequency of Communication with Friends and Family:   . Frequency of Social Gatherings with Friends and Family:   . Attends Religious Services:   . Active Member of Clubs or Organizations:   . Attends Archivist Meetings:   Marland Kitchen Marital Status:     Tobacco Counseling Counseling given: Not Answered   Clinical Intake:  Pain : No/denies pain    Activities of Daily Living In your present state of health, do you have any difficulty performing the following activities: 02/09/2020  Hearing? N  Vision? N  Difficulty concentrating or making decisions? N  Walking or climbing stairs? N  Dressing or bathing? N  Doing errands, shopping? N  Preparing Food and eating ? N  Using the Toilet? N  In the past six months, have you accidently leaked urine? N  Do you have problems with loss of bowel control? N  Managing your Medications? N  Managing your Finances? N  Housekeeping or managing your Housekeeping? N  Some recent data might be hidden     Immunizations and Health Maintenance Immunization History  Administered Date(s) Administered  . Fluad Quad(high Dose 65+) 08/13/2019  . Influenza, High Dose Seasonal PF 09/29/2013, 09/29/2014, 09/15/2015  . Influenza-Unspecified 08/15/2017, 11/17/2018  . PFIZER SARS-COV-2  Vaccination 12/06/2019, 12/27/2019  . Pneumococcal Conjugate-13 06/12/2016  . Pneumococcal Polysaccharide-23 10/10/2012, 02/11/2013   Health Maintenance Due  Topic Date Due  . TETANUS/TDAP  Never done    Patient Care Team: Carollee Herter, Alferd Apa, DO as PCP - General (Family Medicine)  Indicate any recent Medical Services you may have received from other than [facility] providers in the past year (date may be approximate).     Assessment:   This is a routine wellness examination for [facility]. Physical assessment deferred to PCP.  Hearing/Vision screen Unable to assess. This visit is enabled though telemedicine due to Covid 19.   Dietary issues  and exercise activities discussed: Current Exercise Habits: The patient does not participate in regular exercise at present, Exercise limited by: None identified Diet (meal preparation, eat out, water intake, caffeinated beverages, dairy products, fruits and vegetables): 24 hr recall Breakfast: waffle and bacon Lunch: chicken and bread Dinner:  Pizza  Snack: cookie  Goals    . Increase physical activity      Depression Screen PHQ 2/9 Scores 02/09/2020  PHQ - 2 Score 0     Fall Risk Fall Risk  02/09/2020 09/09/2019  Falls in the past year? 0 0  Number falls in past yr: 0 0  Injury with Fall? 0 0  Follow up Education provided;Falls prevention discussed -     Cognitive Function: Ad8 score reviewed for issues:  Issues making decisions:no  Less interest in hobbies / activities:no  Repeats questions, stories (family complaining):no  Trouble using ordinary gadgets (microwave, computer, phone):no  Forgets the month or year: no  Mismanaging finances: no  Remembering appts:no  Daily problems with thinking and/or memory:no Ad8 score is=0         Screening Tests Health Maintenance  Topic Date Due  . TETANUS/TDAP  Never done  . INFLUENZA VACCINE  Completed  . DEXA SCAN  Completed  . PNA vac Low Risk Adult  Completed    Plan:     Please schedule your next medicare wellness visit with me in 1 yr.  Continue to eat heart healthy diet (full of fruits, vegetables, whole grains, lean protein, water--limit salt, fat, and sugar intake) and increase physical activity as tolerated.  Continue doing brain stimulating activities (puzzles, reading, adult coloring books, staying active) to keep memory sharp.   Bring a copy of your living will and/or healthcare power of attorney to your next office visit.   I have personally reviewed and noted the following in the patient's chart:   . Medical and social history . Use of alcohol, tobacco or illicit drugs  . Current medications and supplements . Functional ability and status . Nutritional status . Physical activity . Advanced directives . List of other physicians . Hospitalizations, surgeries, and ER visits in previous 12 months . Vitals . Screenings to include cognitive, depression, and falls . Referrals and appointments  In addition, I have reviewed and discussed with patient certain preventive protocols, quality metrics, and best practice recommendations. A written personalized care plan for preventive services as well as general preventive health recommendations were provided to patient.     [place]on Gully, [place]   02/09/2020

## 2020-02-09 ENCOUNTER — Ambulatory Visit (INDEPENDENT_AMBULATORY_CARE_PROVIDER_SITE_OTHER): Payer: Medicare PPO | Admitting: *Deleted

## 2020-02-09 ENCOUNTER — Encounter: Payer: Self-pay | Admitting: *Deleted

## 2020-02-09 ENCOUNTER — Other Ambulatory Visit: Payer: Self-pay

## 2020-02-09 DIAGNOSIS — Z0000 Encounter for general adult medical examination without abnormal findings: Secondary | ICD-10-CM | POA: Diagnosis not present

## 2020-02-09 NOTE — Patient Instructions (Signed)
Please schedule your next medicare wellness visit with me in 1 yr.  Continue to eat heart healthy diet (full of fruits, vegetables, whole grains, lean protein, water--limit salt, fat, and sugar intake) and increase physical activity as tolerated.  Continue doing brain stimulating activities (puzzles, reading, adult coloring books, staying active) to keep memory sharp.   Bring a copy of your living will and/or healthcare power of attorney to your next office visit.   Faith Webb , Thank you for taking time to come for your Medicare Wellness Visit. I appreciate your ongoing commitment to your health goals. Please review the following plan we discussed and let me know if I can assist you in the future.   These are the goals we discussed: Goals    . Increase physical activity       This is a list of the screening recommended for you and due dates:  Health Maintenance  Topic Date Due  . Tetanus Vaccine  Never done  . Flu Shot  Completed  . DEXA scan (bone density measurement)  Completed  . Pneumonia vaccines  Completed    Preventive Care 55 Years and Older, Female Preventive care refers to lifestyle choices and visits with your health care provider that can promote health and wellness. This includes:  A yearly physical exam. This is also called an annual well check.  Regular dental and eye exams.  Immunizations.  Screening for certain conditions.  Healthy lifestyle choices, such as diet and exercise. What can I expect for my preventive care visit? Physical exam Your health care provider will check:  Height and weight. These may be used to calculate body mass index (BMI), which is a measurement that tells if you are at a healthy weight.  Heart rate and blood pressure.  Your skin for abnormal spots. Counseling Your health care provider may ask you questions about:  Alcohol, tobacco, and drug use.  Emotional well-being.  Home and relationship well-being.  Sexual  activity.  Eating habits.  History of falls.  Memory and ability to understand (cognition).  Work and work Statistician.  Pregnancy and menstrual history. What immunizations do I need?  Influenza (flu) vaccine  This is recommended every year. Tetanus, diphtheria, and pertussis (Tdap) vaccine  You may need a Td booster every 10 years. Varicella (chickenpox) vaccine  You may need this vaccine if you have not already been vaccinated. Zoster (shingles) vaccine  You may need this after age 67. Pneumococcal conjugate (PCV13) vaccine  One dose is recommended after age 45. Pneumococcal polysaccharide (PPSV23) vaccine  One dose is recommended after age 44. Measles, mumps, and rubella (MMR) vaccine  You may need at least one dose of MMR if you were born in 1957 or later. You may also need a second dose. Meningococcal conjugate (MenACWY) vaccine  You may need this if you have certain conditions. Hepatitis A vaccine  You may need this if you have certain conditions or if you travel or work in places where you may be exposed to hepatitis A. Hepatitis B vaccine  You may need this if you have certain conditions or if you travel or work in places where you may be exposed to hepatitis B. Haemophilus influenzae type b (Hib) vaccine  You may need this if you have certain conditions. You may receive vaccines as individual doses or as more than one vaccine together in one shot (combination vaccines). Talk with your health care provider about the risks and benefits of combination vaccines. What tests  do I need? Blood tests  Lipid and cholesterol levels. These may be checked every 5 years, or more frequently depending on your overall health.  Hepatitis C test.  Hepatitis B test. Screening  Lung cancer screening. You may have this screening every year starting at age 8 if you have a 30-pack-year history of smoking and currently smoke or have quit within the past 15  years.  Colorectal cancer screening. All adults should have this screening starting at age 29 and continuing until age 4. Your health care provider may recommend screening at age 13 if you are at increased risk. You will have tests every 1-10 years, depending on your results and the type of screening test.  Diabetes screening. This is done by checking your blood sugar (glucose) after you have not eaten for a while (fasting). You may have this done every 1-3 years.  Mammogram. This may be done every 1-2 years. Talk with your health care provider about how often you should have regular mammograms.  BRCA-related cancer screening. This may be done if you have a family history of breast, ovarian, tubal, or peritoneal cancers. Other tests  Sexually transmitted disease (STD) testing.  Bone density scan. This is done to screen for osteoporosis. You may have this done starting at age 88. Follow these instructions at home: Eating and drinking  Eat a diet that includes fresh fruits and vegetables, whole grains, lean protein, and low-fat dairy products. Limit your intake of foods with high amounts of sugar, saturated fats, and salt.  Take vitamin and mineral supplements as recommended by your health care provider.  Do not drink alcohol if your health care provider tells you not to drink.  If you drink alcohol: ? Limit how much you have to 0-1 drink a day. ? Be aware of how much alcohol is in your drink. In the [place] one drink equals one 12 oz bottle of beer (355 mL), one 5 oz glass of wine (148 mL), or one 1 oz glass of hard liquor (44 mL). Lifestyle  Take daily care of your teeth and gums.  Stay active. Exercise for at least 30 minutes on 5 or more days each week.  Do not use any products that contain nicotine or tobacco, such as cigarettes, e-cigarettes, and chewing tobacco. If you need help quitting, ask your health care provider.  If you are sexually active, practice safe sex. Use a  condom or other form of protection in order to prevent STIs (sexually transmitted infections).  Talk with your health care provider about taking a low-dose aspirin or statin. What's next?  Go to your health care provider once a year for a well check visit.  Ask your health care provider how often you should have your eyes and teeth checked.  Stay up to date on all vaccines. This information is not intended to replace advice given to you by your health care provider. Make sure you discuss any questions you have with your health care provider. Document Revised: 11/06/2018 Document Reviewed: 11/06/2018 Elsevier Patient Education  2020 [employer].

## 2020-02-10 ENCOUNTER — Other Ambulatory Visit: Payer: Self-pay

## 2020-02-11 ENCOUNTER — Ambulatory Visit: Payer: Medicare PPO | Admitting: Family Medicine

## 2020-02-11 ENCOUNTER — Other Ambulatory Visit: Payer: Self-pay

## 2020-02-11 ENCOUNTER — Encounter: Payer: Self-pay | Admitting: Family Medicine

## 2020-02-11 VITALS — BP 120/80 | HR 68 | Temp 97.4°F | Resp 18 | Ht 61.0 in | Wt 146.8 lb

## 2020-02-11 DIAGNOSIS — R42 Dizziness and giddiness: Secondary | ICD-10-CM

## 2020-02-11 DIAGNOSIS — I10 Essential (primary) hypertension: Secondary | ICD-10-CM | POA: Diagnosis not present

## 2020-02-11 DIAGNOSIS — E785 Hyperlipidemia, unspecified: Secondary | ICD-10-CM | POA: Diagnosis not present

## 2020-02-11 DIAGNOSIS — I671 Cerebral aneurysm, nonruptured: Secondary | ICD-10-CM

## 2020-02-11 DIAGNOSIS — G4452 New daily persistent headache (NDPH): Secondary | ICD-10-CM

## 2020-02-11 MED ORDER — LEVOCETIRIZINE DIHYDROCHLORIDE 5 MG PO TABS: 5.0000 mg | ORAL_TABLET | Freq: Every evening | ORAL | 1 refills | Status: DC

## 2020-02-11 MED ORDER — HYDROCHLOROTHIAZIDE 25 MG PO TABS: 25.0000 mg | ORAL_TABLET | Freq: Every day | ORAL | 1 refills | Status: DC

## 2020-02-11 MED ORDER — ROSUVASTATIN CALCIUM 10 MG PO TABS: 10.0000 mg | ORAL_TABLET | Freq: Every day | ORAL | 1 refills | Status: DC

## 2020-02-11 NOTE — Assessment & Plan Note (Signed)
Encouraged heart healthy diet, increase exercise, avoid trans fats, consider a krill oil cap daily 

## 2020-02-11 NOTE — Patient Instructions (Signed)
Dizziness Dizziness is a common problem. It is a feeling of unsteadiness or light-headedness. You may feel like you are about to faint. Dizziness can lead to injury if you stumble or fall. Anyone can become dizzy, but dizziness is more common in older adults. This condition can be caused by a number of things, including medicines, dehydration, or illness. Follow these instructions at home: Eating and drinking  Drink enough fluid to keep your urine clear or pale yellow. This helps to keep you from becoming dehydrated. Try to drink more clear fluids, such as water.  Do not drink alcohol.  Limit your caffeine intake if told to do so by your health care provider. Check ingredients and nutrition facts to see if a food or beverage contains caffeine.  Limit your salt (sodium) intake if told to do so by your health care provider. Check ingredients and nutrition facts to see if a food or beverage contains sodium. Activity  Avoid making quick movements. ? Rise slowly from chairs and steady yourself until you feel okay. ? In the morning, first sit up on the side of the bed. When you feel okay, stand slowly while you hold onto something until you know that your balance is fine.  If you need to stand in one place for a long time, move your legs often. Tighten and relax the muscles in your legs while you are standing.  Do not drive or use heavy machinery if you feel dizzy.  Avoid bending down if you feel dizzy. Place items in your home so that they are easy for you to reach without leaning over. Lifestyle  Do not use any products that contain nicotine or tobacco, such as cigarettes and e-cigarettes. If you need help quitting, ask your health care provider.  Try to reduce your stress level by using methods such as yoga or meditation. Talk with your health care provider if you need help to manage your stress. General instructions  Watch your dizziness for any changes.  Take over-the-counter and  prescription medicines only as told by your health care provider. Talk with your health care provider if you think that your dizziness is caused by a medicine that you are taking.  Tell a friend or a family member that you are feeling dizzy. If he or she notices any changes in your behavior, have this person call your health care provider.  Keep all follow-up visits as told by your health care provider. This is important. Contact a health care provider if:  Your dizziness does not go away.  Your dizziness or light-headedness gets worse.  You feel nauseous.  You have reduced hearing.  You have new symptoms.  You are unsteady on your feet or you feel like the room is spinning. Get help right away if:  You vomit or have diarrhea and are unable to eat or drink anything.  You have problems talking, walking, swallowing, or using your arms, hands, or legs.  You feel generally weak.  You are not thinking clearly or you have trouble forming sentences. It may take a friend or family member to notice this.  You have chest pain, abdominal pain, shortness of breath, or sweating.  Your vision changes.  You have any bleeding.  You have a severe headache.  You have neck pain or a stiff neck.  You have a fever. These symptoms may represent a serious problem that is an emergency. Do not wait to see if the symptoms will go away. Get medical help   right away. Call your local emergency services (911 in the [place] Do not drive yourself to the hospital. Summary  Dizziness is a feeling of unsteadiness or light-headedness. This condition can be caused by a number of things, including medicines, dehydration, or illness.  Anyone can become dizzy, but dizziness is more common in older adults.  Drink enough fluid to keep your urine clear or pale yellow. Do not drink alcohol.  Avoid making quick movements if you feel dizzy. Monitor your dizziness for any changes. This information is not intended to  replace advice given to you by your health care provider. Make sure you discuss any questions you have with your health care provider. Document Revised: 11/15/2017 Document Reviewed: 12/15/2016 Elsevier Patient Education  2020 [employer] Inc[employer]  

## 2020-02-11 NOTE — Assessment & Plan Note (Signed)
Mri -- stable

## 2020-02-11 NOTE — Assessment & Plan Note (Signed)
F/u neuro---  May need ENT

## 2020-02-11 NOTE — Assessment & Plan Note (Signed)
Well controlled, no changes to meds. Encouraged heart healthy diet such as the DASH diet and exercise as tolerated.  °

## 2020-02-11 NOTE — Progress Notes (Signed)
Patient ID: Faith Webb, female    DOB: October 17, 1941  Age: 79 y.o. MRN: [id number]    Subjective:  Subjective  HPI Faith Webb presents for f/u bp and chol.  No new complaints Pt has f/u with neuro 4/9 She still c/o dizziness but it is better  She has had the MRI  Review of Systems  Constitutional: Negative for appetite change, diaphoresis, fatigue and unexpected weight change.  Eyes: Negative for pain, redness and visual disturbance.  Respiratory: Negative for cough, chest tightness, shortness of breath and wheezing.   Cardiovascular: Negative for chest pain, palpitations and leg swelling.  Endocrine: Negative for cold intolerance, heat intolerance, polydipsia, polyphagia and polyuria.  Genitourinary: Negative for difficulty urinating, dysuria and frequency.  Neurological: Positive for dizziness. Negative for light-headedness, numbness and headaches.    History Past Medical History:  Diagnosis Date  . Allergy   . Arthritis   . Hyperlipidemia   . Hypertension     She has a past surgical history that includes Abdominal hysterectomy; Breast surgery; and Breast biopsy (Bilateral).   Her family history includes Cancer in her sister; Diabetes in her brother, father, mother, sister, sister, and sister; Healthy in her child; Heart disease in her mother; Hyperlipidemia in her brother, sister, and sister; Hypertension in her mother and sister; Kidney disease in her sister; Stroke in her brother, mother, and sister.She reports that she has never smoked. She has never used smokeless tobacco. She reports that she does not drink alcohol or use drugs.  Current Outpatient Medications on File Prior to Visit  Medication Sig Dispense Refill  . Magnesium Oxide 420 MG TABS Take 0.5 tablets by mouth daily.     . meclizine (ANTIVERT) 12.5 MG tablet Take 1 tablet (12.5 mg total) by mouth 3 (three) times daily as needed for dizziness. 30 tablet 0  . metoprolol tartrate (LOPRESSOR) 25 MG tablet Take 25  mg by mouth 2 (two) times daily.    . Omega-3 Fatty Acids (THE VERY FINEST FISH OIL) LIQD Take by mouth.    Marland Kitchen omeprazole (PRILOSEC) 20 MG capsule Take 20 mg by mouth daily as needed.    . potassium chloride (KLOR-CON) 10 MEQ tablet Take 3 tablets (30 mEq total) by mouth 2 (two) times daily. 270 tablet 1  . Vitamin D, Ergocalciferol, (DRISDOL) 1.25 MG (50000 UT) CAPS capsule 1 po q week 12 capsule 3  . ofloxacin (FLOXIN OTIC) 0.3 % OTIC solution Place 10 drops into both ears daily. (Patient not taking: Reported on 02/11/2020) 10 mL 0   No current facility-administered medications on file prior to visit.     Objective:  Objective  Physical Exam Vitals and nursing note reviewed.  Constitutional:      Appearance: She is well-developed.  HENT:     Head: Normocephalic and atraumatic.  Eyes:     Conjunctiva/sclera: Conjunctivae normal.  Neck:     Thyroid: No thyromegaly.     Vascular: No carotid bruit or JVD.  Cardiovascular:     Rate and Rhythm: Normal rate and regular rhythm.     Heart sounds: Normal heart sounds. No murmur.  Pulmonary:     Effort: Pulmonary effort is normal. No respiratory distress.     Breath sounds: Normal breath sounds. No wheezing or rales.  Chest:     Chest wall: No tenderness.  Musculoskeletal:     Cervical back: Normal range of motion and neck supple.  Neurological:     Mental Status: She is alert and  oriented to person, place, and time.    BP 120/80 (BP Location: Left Arm, Patient Position: Sitting, Cuff Size: Normal)   Pulse 68   Temp (!) 97.4 F (36.3 C) (Temporal)   Resp 18   Ht 5\' 1"  (1.549 m)   Wt 146 lb 12.8 oz (66.6 kg)   SpO2 93%   BMI 27.74 kg/m  Wt Readings from Last 3 Encounters:  02/11/20 146 lb 12.8 oz (66.6 kg)  01/28/20 145 lb 12.8 oz (66.1 kg)  09/09/19 142 lb (64.4 kg)     Lab Results  Component Value Date   WBC 5.4 05/08/2019   HGB 13.8 05/08/2019   HCT 40.0 05/08/2019   PLT 304 05/08/2019   GLUCOSE 94 01/28/2020    CHOL 128 01/28/2020   TRIG 132.0 01/28/2020   HDL 55.80 01/28/2020   LDLCALC 46 01/28/2020   ALT 20 01/28/2020   AST 15 01/28/2020   NA 141 01/28/2020   K 3.9 01/28/2020   CL 102 01/28/2020   CREATININE 0.95 01/28/2020   BUN 15 01/28/2020   CO2 33 (H) 01/28/2020   TSH 0.61 01/28/2020    MR Angiogram Head Wo Contrast  Result Date: 02/07/2020 CLINICAL DATA:  Peripheral vertigo with cerebral aneurysm. Follow-up. EXAM: MRA HEAD WITHOUT CONTRAST TECHNIQUE: Angiographic images of the Circle of Willis were obtained using MRA technique without intravenous contrast. COMPARISON:  08/07/2019 FINDINGS: The carotid arteries are smooth and widely patent. Atheromatous type irregularity and narrowing of proximal right M1 segment. 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 Hypoplastic left A1 segment. No branch occlusion or generalized beading. IMPRESSION: Unchanged 2 mm left periophthalmic aneurysm. Electronically Signed   By: Monte Fantasia M.D.   On: 02/07/2020 11:08     Assessment & Plan:  Plan  I am having Faith Webb "[name]" maintain her metoprolol tartrate, The Very Finest Fish Oil, Magnesium Oxide, Vitamin D (Ergocalciferol), omeprazole, potassium chloride, meclizine, ofloxacin, hydrochlorothiazide, rosuvastatin, and levocetirizine.  Meds ordered this encounter  Medications  . hydrochlorothiazide (HYDRODIURIL) 25 MG tablet    Sig: Take 1 tablet (25 mg total) by mouth daily.    Dispense:  90 tablet    Refill:  1  . rosuvastatin (CRESTOR) 10 MG tablet    Sig: Take 1 tablet (10 mg total) by mouth at bedtime.    Dispense:  90 tablet    Refill:  1  . levocetirizine (XYZAL) 5 MG tablet    Sig: Take 1 tablet (5 mg total) by mouth every evening.    Dispense:  90 tablet    Refill:  1    Problem List Items Addressed This Visit      Unprioritized   Dizziness    F/u neuro---  May need ENT      Hyperlipidemia    Encouraged heart healthy diet, increase  exercise, avoid trans fats, consider a krill oil cap daily      Relevant Medications   hydrochlorothiazide (HYDRODIURIL) 25 MG tablet   rosuvastatin (CRESTOR) 10 MG tablet   Hypertension - Primary    Well controlled, no changes to meds. Encouraged heart healthy diet such as the DASH diet and exercise as tolerated.       Relevant Medications   hydrochlorothiazide (HYDRODIURIL) 25 MG tablet   rosuvastatin (CRESTOR) 10 MG tablet   Nonruptured cerebral aneurysm    Mri -- stable      Relevant Medications   hydrochlorothiazide (HYDRODIURIL) 25 MG tablet   rosuvastatin (CRESTOR) 10  MG tablet    Other Visit Diagnoses    New daily persistent headache       Relevant Medications   levocetirizine (XYZAL) 5 MG tablet   Dizzy       Relevant Medications   levocetirizine (XYZAL) 5 MG tablet      Follow-up: Return in about 6 months (around 08/13/2020), or if symptoms worsen or fail to improve, for annual exam, fasting.  [name], DO

## 2020-02-26 DIAGNOSIS — M1990 Unspecified osteoarthritis, unspecified site: Secondary | ICD-10-CM | POA: Diagnosis not present

## 2020-02-26 DIAGNOSIS — I739 Peripheral vascular disease, unspecified: Secondary | ICD-10-CM | POA: Diagnosis not present

## 2020-02-26 DIAGNOSIS — R42 Dizziness and giddiness: Secondary | ICD-10-CM | POA: Diagnosis not present

## 2020-02-26 DIAGNOSIS — J309 Allergic rhinitis, unspecified: Secondary | ICD-10-CM | POA: Diagnosis not present

## 2020-02-26 DIAGNOSIS — I10 Essential (primary) hypertension: Secondary | ICD-10-CM | POA: Diagnosis not present

## 2020-02-26 DIAGNOSIS — E559 Vitamin D deficiency, unspecified: Secondary | ICD-10-CM | POA: Diagnosis not present

## 2020-02-26 DIAGNOSIS — E785 Hyperlipidemia, unspecified: Secondary | ICD-10-CM | POA: Diagnosis not present

## 2020-02-26 DIAGNOSIS — K219 Gastro-esophageal reflux disease without esophagitis: Secondary | ICD-10-CM | POA: Diagnosis not present

## 2020-02-26 DIAGNOSIS — Z79899 Other long term (current) drug therapy: Secondary | ICD-10-CM | POA: Diagnosis not present

## 2020-02-26 DIAGNOSIS — Z833 Family history of diabetes mellitus: Secondary | ICD-10-CM | POA: Diagnosis not present

## 2020-03-03 NOTE — Progress Notes (Signed)
Virtual Visit via Video Note The purpose of this virtual visit is to provide medical care while limiting exposure to the novel coronavirus.    Consent was obtained for video visit:  Yes.   Answered questions that patient had about telehealth interaction:  Yes.   I discussed the limitations, risks, security and privacy concerns of performing an evaluation and management service by telemedicine. I also discussed with the patient that there may be a patient responsible charge related to this service. The patient expressed understanding and agreed to proceed.  Pt location: Home Physician Location: office Name of referring provider:  [name], * I connected with Ria Clock at patients initiation/request on 03/04/2020 at  7:50 AM EDT by video enabled telemedicine application and verified that I am speaking with the correct person using two identifiers. Pt MRN:  [id number] Pt DOB:  Apr 07, 1941 Video Participants:  Ria Clock   History of Present Illness:  Faith Webb is a 79 year old female with hypertension, hyperlipidemia, subclinical hyperthyroidism and arthritis who follows up for dizziness, headache and small paraophthalmic artery aneurysm.  UPDATE: She had a follow up MRA of head on 02/06/2020 personally reviewed which showed stable 2 mm left paraophthalmic aneurysm with no other changes.  She still has episodes of dizziness but not as severe or frequent.  Meclizine helps.  A couple of weeks ago, she reached up to grab something and caused muscle spasms in her neck.  She had a posterior headache for a day but then resolved.  She reported some bilateral (right worse than left) elbow pain this week which has started to improve.  HISTORY: She reports episodes of dizziness since around 2019.  She has history of palpitations that have previously resolved but then started to recur last month.  She describes it as mostly lightheadedness, sometimes with trace sensation of movement.   She has associated blurred vision.  No nausea, vomiting, double vision, facial droop or numbness or weakness.  It lasts 5 to 10 minutes.  It occurs daily.  It usually occurs in the morning or standing in the kitchen preparing meals.  It is not aggravated by movement.  Turning off the light helps relieve symptoms.  She was also found to have subclinical hyperthyroidism in June, with low TSH of 0.321.  She has followed up with endocrinology and is not needing antithyroid medications.  She has followed up with cardiology and is on Lopressor.  She also reports dull periorbital nonthrobbing headache when she wakes up in the morning.  It typically lasts 10 minutes.  It occurs maybe once a week to once every other week.  It is separate from the dizziness.    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  Current NSAIDs:  ASA PRN Current analgesics:  none Current antihypertensive:  Lopressor 25mg  twice daily; HCTZ Current antihistamine:  Meclizine Current vitamins/supplements:  Magnesium oxide 420mg  daily Current antihistamine/decongestant:  Xyzal  Past Medical History: Past Medical History:  Diagnosis Date   Allergy    Arthritis    Hyperlipidemia    Hypertension     Medications: Outpatient Encounter Medications as of 03/04/2020  Medication Sig   hydrochlorothiazide (HYDRODIURIL) 25 MG tablet Take 1 tablet (25 mg total) by mouth daily.   levocetirizine (XYZAL) 5 MG tablet Take 1 tablet (5 mg total) by mouth every evening.  Magnesium Oxide 420 MG TABS Take 0.5 tablets by mouth daily.    meclizine (ANTIVERT) 12.5 MG tablet Take 1 tablet (12.5 mg total) by mouth 3 (three) times daily as needed for dizziness.   metoprolol tartrate (LOPRESSOR) 25 MG tablet Take 25 mg by mouth 2 (two) times daily.   ofloxacin (FLOXIN OTIC) 0.3 % OTIC  solution Place 10 drops into both ears daily. (Patient not taking: Reported on 02/11/2020)   Omega-3 Fatty Acids (THE VERY FINEST FISH OIL) LIQD Take by mouth.   omeprazole (PRILOSEC) 20 MG capsule Take 20 mg by mouth daily as needed.   potassium chloride (KLOR-CON) 10 MEQ tablet Take 3 tablets (30 mEq total) by mouth 2 (two) times daily.   rosuvastatin (CRESTOR) 10 MG tablet Take 1 tablet (10 mg total) by mouth at bedtime.   Vitamin D, Ergocalciferol, (DRISDOL) 1.25 MG (50000 UT) CAPS capsule 1 po q week   No facility-administered encounter medications on file as of 03/04/2020.    Allergies: No Known Allergies  Family History: Family History  Problem Relation Age of Onset   Diabetes Mother    Heart disease Mother    Hypertension Mother    Stroke Mother    Diabetes Father    Cancer Sister    Diabetes Sister    Diabetes Brother    Hyperlipidemia Brother    Stroke Brother    Diabetes Sister    Hyperlipidemia Sister    Hypertension Sister    Kidney disease Sister    Stroke Sister    Diabetes Sister    Hyperlipidemia Sister    Healthy Child     Social History: Social History   Socioeconomic History   Marital status: Widowed    Spouse name: Not on file   Number of children: 2   Years of education: Not on file   Highest education level: Bachelor's degree (e.g., BA, AB, BS)  Occupational History   Not on file  Tobacco Use   Smoking status: Never Smoker   Smokeless tobacco: Never Used  Substance and Sexual Activity   Alcohol use: Never   Drug use: Never   Sexual activity: Not on file  Other Topics Concern   Not on file  Social History Narrative   Pt lives alone in 1 story home, she has 2 children, right handed, she drinks coffee daily, tea/ soda occasionally.   Social Determinants of Health   Financial Resource Strain: Low Risk    Difficulty of Paying Living Expenses: Not hard at all  Food Insecurity: No Food Insecurity    Worried About Charity fundraiser in the Last Year: Never true   [place] in the Last Year: Never true  Transportation Needs: No Transportation Needs   Lack of Transportation (Medical): No   Lack of Transportation (Non-Medical): No  Physical Activity:    Days of Exercise per Week:    Minutes of Exercise per Session:   Stress:    Feeling of Stress :   Social Connections:    Frequency of Communication with Friends and Family:    Frequency of Social Gatherings with Friends and Family:    Attends Religious Services:    Active Member of Clubs or Organizations:    Attends Archivist Meetings:    Marital Status:   Intimate Partner Violence:    Fear of Current or Ex-Partner:    Emotionally Abused:    Physically Abused:    Sexually Abused:     Observations/Objective:  There were no vitals taken for this visit. No acute distress.  Alert and oriented.  Speech fluent and not dysarthric.  Language intact.  Eyes orthophoric on primary gaze.  Face symmetric.  Assessment and Plan:   1.  Dizziness, vague.  No clear neurologic etiology. 2.  2 mm left paraophthalmic artery aneurysm, incidental finding.  Stable. 3.  Mild episodic headache, likely tension-type, manageable.  Recent headache aggravated by cervical myofascial pain which has resolved.  1.  I would repeat MRA of head in one year to ensure stability. 2.  Follow up after repeat testing.  Follow Up Instructions:    -I discussed the assessment and treatment plan with the patient. The patient was provided an opportunity to ask questions and all were answered. The patient agreed with the plan and demonstrated an understanding of the instructions.   The patient was advised to call back or seek an in-person evaluation if the symptoms worsen or if the condition fails to improve as anticipated.     Cira Servant, DO

## 2020-03-04 ENCOUNTER — Other Ambulatory Visit: Payer: Self-pay

## 2020-03-04 ENCOUNTER — Telehealth (INDEPENDENT_AMBULATORY_CARE_PROVIDER_SITE_OTHER): Payer: Medicare PPO | Admitting: Neurology

## 2020-03-04 DIAGNOSIS — I671 Cerebral aneurysm, nonruptured: Secondary | ICD-10-CM | POA: Diagnosis not present

## 2020-03-04 DIAGNOSIS — H02105 Unspecified ectropion of left lower eyelid: Secondary | ICD-10-CM | POA: Diagnosis not present

## 2020-03-04 DIAGNOSIS — G44219 Episodic tension-type headache, not intractable: Secondary | ICD-10-CM

## 2020-03-04 DIAGNOSIS — H02831 Dermatochalasis of right upper eyelid: Secondary | ICD-10-CM | POA: Diagnosis not present

## 2020-03-04 DIAGNOSIS — H2513 Age-related nuclear cataract, bilateral: Secondary | ICD-10-CM | POA: Diagnosis not present

## 2020-03-04 DIAGNOSIS — R42 Dizziness and giddiness: Secondary | ICD-10-CM

## 2020-03-04 DIAGNOSIS — H02102 Unspecified ectropion of right lower eyelid: Secondary | ICD-10-CM | POA: Diagnosis not present

## 2020-03-04 NOTE — Progress Notes (Signed)
MRA of the brain order for Cerbral Aneurysm x 1 yr per Dr. Everlena [place].

## 2020-03-22 ENCOUNTER — Telehealth: Payer: Self-pay | Admitting: Family Medicine

## 2020-03-22 DIAGNOSIS — H81399 Other peripheral vertigo, unspecified ear: Secondary | ICD-10-CM

## 2020-03-22 MED ORDER — MECLIZINE HCL 12.5 MG PO TABS: 12.5000 mg | ORAL_TABLET | Freq: Three times a day (TID) | ORAL | 2 refills | Status: DC | PRN

## 2020-03-22 NOTE — Telephone Encounter (Signed)
Please advise 

## 2020-03-22 NOTE — Telephone Encounter (Signed)
Pt called. VM left. Copy of letter sent to Mychart and a copy placed at the front.

## 2020-03-22 NOTE — Telephone Encounter (Signed)
Patient needs a note of approval to join a personal fitness at the [facility]. She states her trainer wants to start her off with  30 mins sessions 2 times a week for  5 weeks to start out. Please Advise patient if this can be done and when she can pick up the letter. Thanks

## 2020-03-22 NOTE — Telephone Encounter (Signed)
Medication: meclizine (ANTIVERT) 12.5 MG tablet [   Has the patient contacted their pharmacy? Yes.   (If no, request that the patient contact the pharmacy for the refill.) (If yes, when and what did the pharmacy advise?)  Preferred Pharmacy (with phone number or street name):   [id number] [place], [place] - [address] AT [facility] [address]  [address], [place] [address]  Phone:  [phone] Fax:  [phone]   Agent: Please be advised that RX refills may take up to 3 business days. We ask that you follow-up with your pharmacy.

## 2020-03-22 NOTE — Telephone Encounter (Signed)
Refill sent.

## 2020-03-22 NOTE — Telephone Encounter (Signed)
Ok to give her a note saying it is ok

## 2020-04-22 DIAGNOSIS — H02105 Unspecified ectropion of left lower eyelid: Secondary | ICD-10-CM | POA: Diagnosis not present

## 2020-04-22 DIAGNOSIS — H02101 Unspecified ectropion of right upper eyelid: Secondary | ICD-10-CM | POA: Diagnosis not present

## 2020-04-22 DIAGNOSIS — H02104 Unspecified ectropion of left upper eyelid: Secondary | ICD-10-CM | POA: Diagnosis not present

## 2020-04-22 DIAGNOSIS — H02102 Unspecified ectropion of right lower eyelid: Secondary | ICD-10-CM | POA: Diagnosis not present

## 2020-05-09 DIAGNOSIS — H02105 Unspecified ectropion of left lower eyelid: Secondary | ICD-10-CM | POA: Diagnosis not present

## 2020-05-09 DIAGNOSIS — H02102 Unspecified ectropion of right lower eyelid: Secondary | ICD-10-CM | POA: Diagnosis not present

## 2020-05-09 DIAGNOSIS — H02101 Unspecified ectropion of right upper eyelid: Secondary | ICD-10-CM | POA: Diagnosis not present

## 2020-05-09 DIAGNOSIS — H02104 Unspecified ectropion of left upper eyelid: Secondary | ICD-10-CM | POA: Diagnosis not present

## 2020-06-03 ENCOUNTER — Other Ambulatory Visit: Payer: Self-pay

## 2020-06-03 MED ORDER — POTASSIUM CHLORIDE ER 10 MEQ PO TBCR: 30.0000 meq | EXTENDED_RELEASE_TABLET | Freq: Two times a day (BID) | ORAL | 1 refills | Status: DC

## 2020-06-08 DIAGNOSIS — H02102 Unspecified ectropion of right lower eyelid: Secondary | ICD-10-CM | POA: Diagnosis not present

## 2020-06-08 DIAGNOSIS — H02104 Unspecified ectropion of left upper eyelid: Secondary | ICD-10-CM | POA: Diagnosis not present

## 2020-06-08 DIAGNOSIS — H02105 Unspecified ectropion of left lower eyelid: Secondary | ICD-10-CM | POA: Diagnosis not present

## 2020-06-08 DIAGNOSIS — H02101 Unspecified ectropion of right upper eyelid: Secondary | ICD-10-CM | POA: Diagnosis not present

## 2020-06-15 DIAGNOSIS — H02422 Myogenic ptosis of left eyelid: Secondary | ICD-10-CM | POA: Diagnosis not present

## 2020-06-15 DIAGNOSIS — H02135 Senile ectropion of left lower eyelid: Secondary | ICD-10-CM | POA: Diagnosis not present

## 2020-06-15 DIAGNOSIS — H04332 Acute lacrimal canaliculitis of left lacrimal passage: Secondary | ICD-10-CM | POA: Diagnosis not present

## 2020-06-27 ENCOUNTER — Encounter: Payer: Self-pay | Admitting: Family Medicine

## 2020-06-28 DIAGNOSIS — Z20822 Contact with and (suspected) exposure to covid-19: Secondary | ICD-10-CM | POA: Diagnosis not present

## 2020-07-19 DIAGNOSIS — H02135 Senile ectropion of left lower eyelid: Secondary | ICD-10-CM | POA: Diagnosis not present

## 2020-07-19 DIAGNOSIS — H02422 Myogenic ptosis of left eyelid: Secondary | ICD-10-CM | POA: Diagnosis not present

## 2020-07-19 DIAGNOSIS — H04552 Acquired stenosis of left nasolacrimal duct: Secondary | ICD-10-CM | POA: Diagnosis not present

## 2020-07-19 DIAGNOSIS — H04422 Chronic lacrimal canaliculitis of left lacrimal passage: Secondary | ICD-10-CM | POA: Diagnosis not present

## 2020-07-19 DIAGNOSIS — H04332 Acute lacrimal canaliculitis of left lacrimal passage: Secondary | ICD-10-CM | POA: Diagnosis not present

## 2020-07-19 DIAGNOSIS — H04022 Chronic dacryoadenitis, left lacrimal gland: Secondary | ICD-10-CM | POA: Diagnosis not present

## 2020-08-03 DIAGNOSIS — H04332 Acute lacrimal canaliculitis of left lacrimal passage: Secondary | ICD-10-CM | POA: Diagnosis not present

## 2020-08-03 DIAGNOSIS — H02135 Senile ectropion of left lower eyelid: Secondary | ICD-10-CM | POA: Diagnosis not present

## 2020-08-03 DIAGNOSIS — H04422 Chronic lacrimal canaliculitis of left lacrimal passage: Secondary | ICD-10-CM | POA: Diagnosis not present

## 2020-08-03 DIAGNOSIS — H02422 Myogenic ptosis of left eyelid: Secondary | ICD-10-CM | POA: Diagnosis not present

## 2020-08-03 DIAGNOSIS — H04552 Acquired stenosis of left nasolacrimal duct: Secondary | ICD-10-CM | POA: Diagnosis not present

## 2020-08-12 ENCOUNTER — Other Ambulatory Visit: Payer: Self-pay | Admitting: Family Medicine

## 2020-08-12 DIAGNOSIS — E559 Vitamin D deficiency, unspecified: Secondary | ICD-10-CM

## 2020-08-12 DIAGNOSIS — E785 Hyperlipidemia, unspecified: Secondary | ICD-10-CM

## 2020-08-12 DIAGNOSIS — I10 Essential (primary) hypertension: Secondary | ICD-10-CM

## 2020-08-12 NOTE — Telephone Encounter (Signed)
Last vitamin check was 05/08/19 and it was 49.  Do you still want to continue?

## 2020-08-15 ENCOUNTER — Encounter: Payer: Self-pay | Admitting: Family Medicine

## 2020-08-15 ENCOUNTER — Other Ambulatory Visit: Payer: Self-pay

## 2020-08-15 ENCOUNTER — Ambulatory Visit (INDEPENDENT_AMBULATORY_CARE_PROVIDER_SITE_OTHER): Payer: Medicare PPO | Admitting: Family Medicine

## 2020-08-15 VITALS — BP 110/68 | HR 75 | Temp 98.1°F | Resp 18 | Ht 61.0 in | Wt 148.8 lb

## 2020-08-15 DIAGNOSIS — I10 Essential (primary) hypertension: Secondary | ICD-10-CM

## 2020-08-15 DIAGNOSIS — Z1159 Encounter for screening for other viral diseases: Secondary | ICD-10-CM

## 2020-08-15 DIAGNOSIS — E785 Hyperlipidemia, unspecified: Secondary | ICD-10-CM

## 2020-08-15 DIAGNOSIS — Z0000 Encounter for general adult medical examination without abnormal findings: Secondary | ICD-10-CM

## 2020-08-15 DIAGNOSIS — E559 Vitamin D deficiency, unspecified: Secondary | ICD-10-CM

## 2020-08-15 DIAGNOSIS — Z23 Encounter for immunization: Secondary | ICD-10-CM | POA: Diagnosis not present

## 2020-08-15 NOTE — Patient Instructions (Signed)
Preventive Care 38 Years and Older, Female Preventive care refers to lifestyle choices and visits with your health care provider that can promote health and wellness. This includes:  A yearly physical exam. This is also called an annual well check.  Regular dental and eye exams.  Immunizations.  Screening for certain conditions.  Healthy lifestyle choices, such as diet and exercise. What can I expect for my preventive care visit? Physical exam Your health care provider will check:  Height and weight. These may be used to calculate body mass index (BMI), which is a measurement that tells if you are at a healthy weight.  Heart rate and blood pressure.  Your skin for abnormal spots. Counseling Your health care provider may ask you questions about:  Alcohol, tobacco, and drug use.  Emotional well-being.  Home and relationship well-being.  Sexual activity.  Eating habits.  History of falls.  Memory and ability to understand (cognition).  Work and work Statistician.  Pregnancy and menstrual history. What immunizations do I need?  Influenza (flu) vaccine  This is recommended every year. Tetanus, diphtheria, and pertussis (Tdap) vaccine  You may need a Td booster every 10 years. Varicella (chickenpox) vaccine  You may need this vaccine if you have not already been vaccinated. Zoster (shingles) vaccine  You may need this after age 79. Pneumococcal conjugate (PCV13) vaccine  One dose is recommended after age 79. Pneumococcal polysaccharide (PPSV23) vaccine  One dose is recommended after age 79. Measles, mumps, and rubella (MMR) vaccine  You may need at least one dose of MMR if you were born in 1957 or later. You may also need a second dose. Meningococcal conjugate (MenACWY) vaccine  You may need this if you have certain conditions. Hepatitis A vaccine  You may need this if you have certain conditions or if you travel or work in places where you may be exposed  to hepatitis A. Hepatitis B vaccine  You may need this if you have certain conditions or if you travel or work in places where you may be exposed to hepatitis B. Haemophilus influenzae type b (Hib) vaccine  You may need this if you have certain conditions. You may receive vaccines as individual doses or as more than one vaccine together in one shot (combination vaccines). Talk with your health care provider about the risks and benefits of combination vaccines. What tests do I need? Blood tests  Lipid and cholesterol levels. These may be checked every 5 years, or more frequently depending on your overall health.  Hepatitis C test.  Hepatitis B test. Screening  Lung cancer screening. You may have this screening every year starting at age 79 if you have a 30-pack-year history of smoking and currently smoke or have quit within the past 15 years.  Colorectal cancer screening. All adults should have this screening starting at age 79 and continuing until age 15. Your health care provider may recommend screening at age 23 if you are at increased risk. You will have tests every 1-10 years, depending on your results and the type of screening test.  Diabetes screening. This is done by checking your blood sugar (glucose) after you have not eaten for a while (fasting). You may have this done every 1-3 years.  Mammogram. This may be done every 1-2 years. Talk with your health care provider about how often you should have regular mammograms.  BRCA-related cancer screening. This may be done if you have a family history of breast, ovarian, tubal, or peritoneal cancers.  Other tests  Sexually transmitted disease (STD) testing.  Bone density scan. This is done to screen for osteoporosis. You may have this done starting at age 79. Follow these instructions at home: Eating and drinking  Eat a diet that includes fresh fruits and vegetables, whole grains, lean protein, and low-fat dairy products. Limit  your intake of foods with high amounts of sugar, saturated fats, and salt.  Take vitamin and mineral supplements as recommended by your health care provider.  Do not drink alcohol if your health care provider tells you not to drink.  If you drink alcohol: ? Limit how much you have to 0-1 drink a day. ? Be aware of how much alcohol is in your drink. In the [place] one drink equals one 12 oz bottle of beer (355 mL), one 5 oz glass of wine (148 mL), or one 1 oz glass of hard liquor (44 mL). Lifestyle  Take daily care of your teeth and gums.  Stay active. Exercise for at least 30 minutes on 5 or more days each week.  Do not use any products that contain nicotine or tobacco, such as cigarettes, e-cigarettes, and chewing tobacco. If you need help quitting, ask your health care provider.  If you are sexually active, practice safe sex. Use a condom or other form of protection in order to prevent STIs (sexually transmitted infections).  Talk with your health care provider about taking a low-dose aspirin or statin. What's next?  Go to your health care provider once a year for a well check visit.  Ask your health care provider how often you should have your eyes and teeth checked.  Stay up to date on all vaccines. This information is not intended to replace advice given to you by your health care provider. Make sure you discuss any questions you have with your health care provider. Document Revised: 11/06/2018 Document Reviewed: 11/06/2018 Elsevier Patient Education  2020 [employer].

## 2020-08-15 NOTE — Progress Notes (Signed)
Subjective:     Faith Webb is a 79 y.o. female and is here for a comprehensive physical exam. The patient reports problems - eye problems --- seeing opth for blocked tear ducts and having surgery in [place] . Pt also need f/u and labs --- she needs no refills at this time  Social History   Socioeconomic History  . Marital status: Widowed    Spouse name: Not on file  . Number of children: 2  . Years of education: Not on file  . Highest education level: Bachelor's degree (e.g., BA, AB, BS)  Occupational History  . Not on file  Tobacco Use  . Smoking status: Never Smoker  . Smokeless tobacco: Never Used  Vaping Use  . Vaping Use: Never used  Substance and Sexual Activity  . Alcohol use: Never  . Drug use: Never  . Sexual activity: Not on file  Other Topics Concern  . Not on file  Social History Narrative   Pt lives alone in 1 story home, she has 2 children, right handed, she drinks coffee daily, tea/ soda occasionally.   Social Determinants of Health   Financial Resource Strain: Low Risk   . Difficulty of Paying Living Expenses: Not hard at all  Food Insecurity: No Food Insecurity  . Worried About Programme researcher, broadcasting/film/video in the Last Year: Never true  . Ran Out of Food in the Last Year: Never true  Transportation Needs: No Transportation Needs  . Lack of Transportation (Medical): No  . Lack of Transportation (Non-Medical): No  Physical Activity:   . Days of Exercise per Week: Not on file  . Minutes of Exercise per Session: Not on file  Stress:   . Feeling of Stress : Not on file  Social Connections:   . Frequency of Communication with Friends and Family: Not on file  . Frequency of Social Gatherings with Friends and Family: Not on file  . Attends Religious Services: Not on file  . Active Member of Clubs or Organizations: Not on file  . Attends Banker Meetings: Not on file  . Marital Status: Not on file  Intimate Partner Violence:   . Fear of Current or  Ex-Partner: Not on file  . Emotionally Abused: Not on file  . Physically Abused: Not on file  . Sexually Abused: Not on file   Health Maintenance  Topic Date Due  . Hepatitis C Screening  Never done  . TETANUS/TDAP  Never done  . INFLUENZA VACCINE  06/26/2020  . DEXA SCAN  Completed  . COVID-19 Vaccine  Completed  . PNA vac Low Risk Adult  Completed    The following portions of the patient's history were reviewed and updated as appropriate:  She  has a past medical history of Allergy, Arthritis, Hyperlipidemia, and Hypertension. She does not have any pertinent problems on file. She  has a past surgical history that includes Abdominal hysterectomy; Breast surgery; and Breast biopsy (Bilateral). Her family history includes Cancer in her sister; Diabetes in her brother, father, mother, sister, sister, and sister; Healthy in her child; Heart disease in her mother; Hyperlipidemia in her brother, sister, and sister; Hypertension in her mother and sister; Kidney disease in her sister; Stroke in her brother, mother, and sister. She  reports that she has never smoked. She has never used smokeless tobacco. She reports that she does not drink alcohol and does not use drugs. She has a current medication list which includes the following prescription(s): hydrochlorothiazide,  levocetirizine, magnesium oxide, meclizine, metoprolol tartrate, the very finest fish oil, omeprazole, potassium chloride, rosuvastatin, and vitamin d (ergocalciferol). Current Outpatient Medications on File Prior to Visit  Medication Sig Dispense Refill  . hydrochlorothiazide (HYDRODIURIL) 25 MG tablet TAKE 1 TABLET(25 MG) BY MOUTH DAILY 90 tablet 1  . levocetirizine (XYZAL) 5 MG tablet Take 1 tablet (5 mg total) by mouth every evening. 90 tablet 1  . Magnesium Oxide 420 MG TABS Take 0.5 tablets by mouth daily.     . meclizine (ANTIVERT) 12.5 MG tablet Take 1 tablet (12.5 mg total) by mouth 3 (three) times daily as needed for  dizziness. 30 tablet 2  . metoprolol tartrate (LOPRESSOR) 25 MG tablet Take 25 mg by mouth 2 (two) times daily.    . Omega-3 Fatty Acids (THE VERY FINEST FISH OIL) LIQD Take by mouth.    Marland Kitchen omeprazole (PRILOSEC) 20 MG capsule Take 20 mg by mouth daily as needed.    . potassium chloride (KLOR-CON) 10 MEQ tablet Take 3 tablets (30 mEq total) by mouth 2 (two) times daily. 270 tablet 1  . rosuvastatin (CRESTOR) 10 MG tablet Take 1 tablet (10 mg total) by mouth at bedtime. 90 tablet 1  . Vitamin D, Ergocalciferol, (DRISDOL) 1.25 MG (50000 UNIT) CAPS capsule TAKE 1 CAPSULE BY MOUTH EVERY WEEK 12 capsule 3   No current facility-administered medications on file prior to visit.   She has No Known Allergies..  Review of Systems Review of Systems  Constitutional: Negative for activity change, appetite change and fatigue.  HENT: Negative for hearing loss, congestion, tinnitus and ear discharge.  dentist q63m Eyes: Negative for visual disturbance (see optho q1y -- vision corrected to 20/20 with glasses).  Respiratory: Negative for cough, chest tightness and shortness of breath.   Cardiovascular: Negative for chest pain, palpitations and leg swelling.  Gastrointestinal: Negative for abdominal pain, diarrhea, constipation and abdominal distention.  Genitourinary: Negative for urgency, frequency, decreased urine volume and difficulty urinating.  Musculoskeletal: Negative for back pain, arthralgias and gait problem.  Skin: Negative for color change, pallor and rash.  Neurological: Negative for dizziness, light-headedness, numbness and headaches.  Hematological: Negative for adenopathy. Does not bruise/bleed easily.  Psychiatric/Behavioral: Negative for suicidal ideas, confusion, sleep disturbance, self-injury, dysphoric mood, decreased concentration and agitation.       Objective:    BP 110/68 (BP Location: Right Arm, Patient Position: Sitting, Cuff Size: Normal)   Pulse 75   Temp 98.1 F (36.7 C)  (Oral)   Resp 18   Ht 5\' 1"  (1.549 m)   Wt 148 lb 12.8 oz (67.5 kg)   SpO2 96%   BMI 28.12 kg/m  General appearance: alert, cooperative, appears stated age and no distress Head: Normocephalic, without obvious abnormality, atraumatic Eyes: negative findings: lids and lashes normal, conjunctivae and sclerae normal and pupils equal, round, reactive to light and accomodation Ears: normal TM's and external ear canals both ears Neck: no adenopathy, no carotid bruit, no JVD, supple, symmetrical, trachea midline and thyroid not enlarged, symmetric, no tenderness/mass/nodules Back: symmetric, no curvature. ROM normal. No CVA tenderness. Lungs: clear to auscultation bilaterally Breasts: normal appearance, no masses or tenderness Heart: regular rate and rhythm, S1, S2 normal, no murmur, click, rub or gallop Abdomen: soft, non-tender; bowel sounds normal; no masses,  no organomegaly Pelvic: not indicated; status post hysterectomy, negative ROS Extremities: extremities normal, atraumatic, no cyanosis or edema Pulses: 2+ and symmetric Skin: Skin color, texture, turgor normal. No rashes or lesions Lymph nodes: Cervical, supraclavicular,  and axillary nodes normal. Neurologic: Alert and oriented X 3, normal strength and tone. Normal symmetric reflexes. Normal coordination and gait    Assessment:    Healthy female exam.      Plan:    ghm utd Check labs  See After Visit Summary for Counseling Recommendations    1. Need for influenza vaccination  - Flu Vaccine QUAD High Dose(Fluad)  2. Preventative health care See above   3. Vitamin D deficiency  - Vitamin D (25 hydroxy)  4. Essential hypertension Well controlled, no changes to meds. Encouraged heart healthy diet such as the DASH diet and exercise as tolerated.   5. Dyslipidemia Encouraged heart healthy diet, increase exercise, avoid trans fats, consider a krill oil cap daily - Lipid panel - Comprehensive metabolic panel  6. Need for  hepatitis C screening test   - Hepatitis C antibody

## 2020-08-16 LAB — HEPATITIS C ANTIBODY
Hepatitis C Ab: NONREACTIVE
SIGNAL TO CUT-OFF: 0.01 (ref <1.00)

## 2020-08-16 LAB — COMPREHENSIVE METABOLIC PANEL
AG Ratio: 1.7 (calc) (ref 1.0–2.5)
ALT: 15 U/L (ref 6–29)
AST: 13 U/L (ref 10–35)
Albumin: 4.3 g/dL (ref 3.6–5.1)
Alkaline phosphatase (APISO): 48 U/L (ref 37–153)
BUN/Creatinine Ratio: 15 (calc) (ref 6–22)
BUN: 15 mg/dL (ref 7–25)
CO2: 32 mmol/L (ref 20–32)
Calcium: 9.7 mg/dL (ref 8.6–10.4)
Chloride: 104 mmol/L (ref 98–110)
Creat: 0.97 mg/dL — ABNORMAL HIGH (ref 0.60–0.93)
Globulin: 2.6 g/dL (calc) (ref 1.9–3.7)
Glucose, Bld: 86 mg/dL (ref 65–99)
Potassium: 4.1 mmol/L (ref 3.5–5.3)
Sodium: 141 mmol/L (ref 135–146)
Total Bilirubin: 0.5 mg/dL (ref 0.2–1.2)
Total Protein: 6.9 g/dL (ref 6.1–8.1)

## 2020-08-16 LAB — VITAMIN D 25 HYDROXY (VIT D DEFICIENCY, FRACTURES): Vit D, 25-Hydroxy: 113 ng/mL — ABNORMAL HIGH (ref 30–100)

## 2020-08-16 LAB — LIPID PANEL
Cholesterol: 138 mg/dL (ref <200)
HDL: 64 mg/dL (ref >=50)
LDL Cholesterol (Calc): 53 mg/dL (calc)
Non-HDL Cholesterol (Calc): 74 mg/dL (calc) (ref <130)
Total CHOL/HDL Ratio: 2.2 (calc) (ref <5.0)
Triglycerides: 131 mg/dL (ref <150)

## 2020-09-06 ENCOUNTER — Other Ambulatory Visit: Payer: Self-pay | Admitting: Family Medicine

## 2020-09-09 DIAGNOSIS — H04332 Acute lacrimal canaliculitis of left lacrimal passage: Secondary | ICD-10-CM | POA: Diagnosis not present

## 2020-09-09 DIAGNOSIS — H02831 Dermatochalasis of right upper eyelid: Secondary | ICD-10-CM | POA: Diagnosis not present

## 2020-09-09 DIAGNOSIS — H2513 Age-related nuclear cataract, bilateral: Secondary | ICD-10-CM | POA: Diagnosis not present

## 2020-09-09 DIAGNOSIS — H02105 Unspecified ectropion of left lower eyelid: Secondary | ICD-10-CM | POA: Diagnosis not present

## 2020-09-19 DIAGNOSIS — H02135 Senile ectropion of left lower eyelid: Secondary | ICD-10-CM | POA: Diagnosis not present

## 2020-09-29 ENCOUNTER — Other Ambulatory Visit: Payer: Self-pay | Admitting: Family Medicine

## 2020-09-29 DIAGNOSIS — E785 Hyperlipidemia, unspecified: Secondary | ICD-10-CM

## 2020-09-29 DIAGNOSIS — I10 Essential (primary) hypertension: Secondary | ICD-10-CM

## 2020-11-11 DIAGNOSIS — Z20822 Contact with and (suspected) exposure to covid-19: Secondary | ICD-10-CM | POA: Diagnosis not present

## 2020-12-05 DIAGNOSIS — Z03818 Encounter for observation for suspected exposure to other biological agents ruled out: Secondary | ICD-10-CM | POA: Diagnosis not present

## 2020-12-06 DIAGNOSIS — H04422 Chronic lacrimal canaliculitis of left lacrimal passage: Secondary | ICD-10-CM | POA: Diagnosis not present

## 2020-12-06 DIAGNOSIS — H02135 Senile ectropion of left lower eyelid: Secondary | ICD-10-CM | POA: Diagnosis not present

## 2020-12-06 DIAGNOSIS — H04552 Acquired stenosis of left nasolacrimal duct: Secondary | ICD-10-CM | POA: Diagnosis not present

## 2020-12-07 ENCOUNTER — Telehealth: Payer: Self-pay | Admitting: Family Medicine

## 2020-12-07 MED ORDER — POTASSIUM CHLORIDE ER 10 MEQ PO TBCR: EXTENDED_RELEASE_TABLET | ORAL | 1 refills | Status: DC

## 2020-12-07 NOTE — Telephone Encounter (Signed)
Medication: potassium chloride (KLOR-CON) 10 MEQ tablet    Has the patient contacted their pharmacy? No. (If no, request that the patient contact the pharmacy for the refill.) (If yes, when and what did the pharmacy advise?)  Preferred Pharmacy (with phone number or street name):  [id number] [place], [place] - [address] AT [facility] [address]  [address], [place] [address]  Phone:  [phone] Fax:  [phone]  DEA #:  [id number]  DAW Reason: --     Agent: Please be advised that RX refills may take up to 3 business days. We ask that you follow-up with your pharmacy.

## 2020-12-07 NOTE — Telephone Encounter (Signed)
Refill sent.

## 2020-12-08 IMAGING — MR MR HEAD W/O CM
9 of 11 series · 31 of 48 positions shown · IV contrast (Yes)
Comparison: None.

CLINICAL DATA: Headache, acute, normal neuro exam. Persistent daily
headaches. Increased dizziness and fatigue. Progressive symptoms.

EXAM:
MRI HEAD WITHOUT CONTRAST
MRA HEAD WITHOUT CONTRAST
TECHNIQUE: Multiplanar, multiecho pulse sequences of the brain and surrounding
structures were obtained without intravenous contrast. Angiographic
images of the head were obtained using MRA technique without
contrast.

[Series 3: DWI · axial · 3.0mm · 1.09mm/px · z∈[-87,+62]mm · 6 of 102 slices shown (1 of 4)]
[im 1/102]
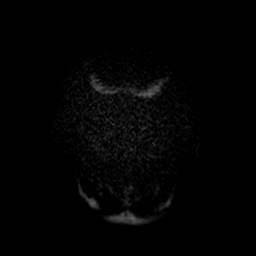
[im 21/102]
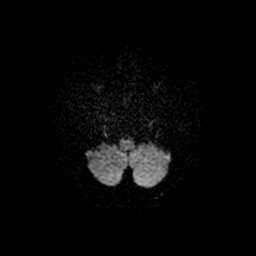
[im 41/102]
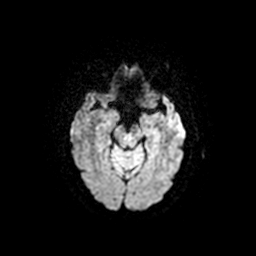
[im 61/102]
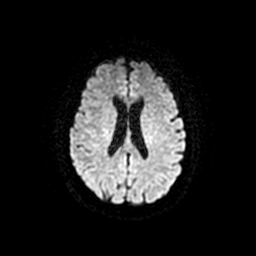
[im 81/102]
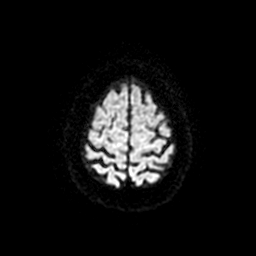
[im 102/102]
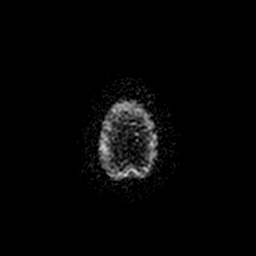

[Series 4: T1 · sagittal · 5.0mm · 0.47mm/px · 2 of 22 slices shown (1 of 2)]
[im 1/22]
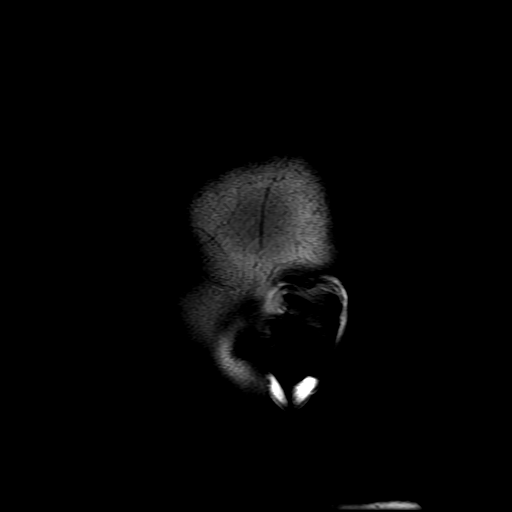
[im 22/22]
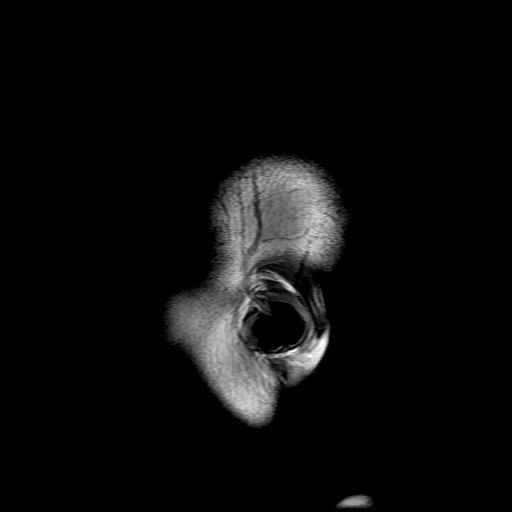

[Series 5: T2 · axial · 5.0mm · 0.43mm/px · z∈[-82,+65]mm · 2 of 22 slices shown]
[im 1/22]
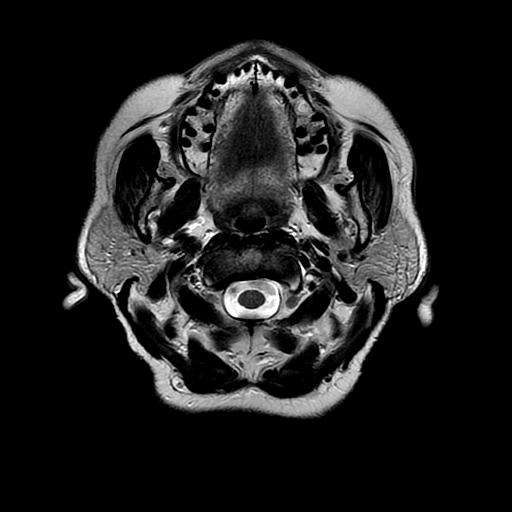
[im 22/22]
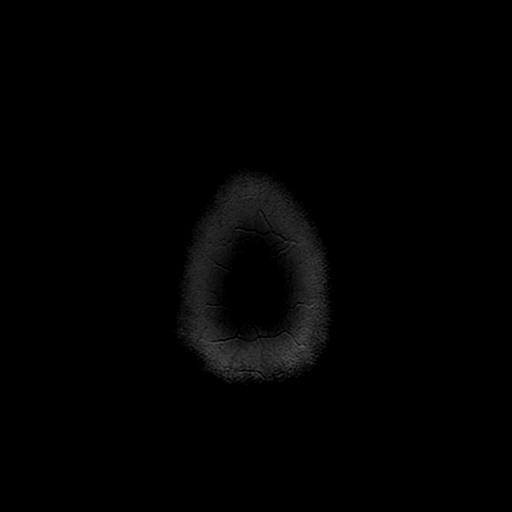

[Series 6: FLAIR · axial · 3.0mm · 0.43mm/px · z∈[-83,+66]mm · 2 of 26 slices shown]
[im 1/26]
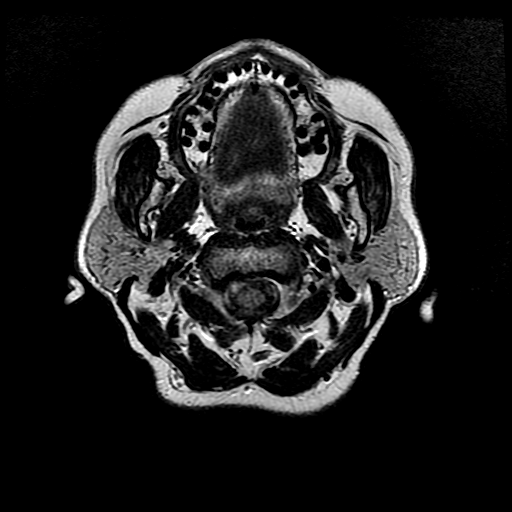
[im 26/26]
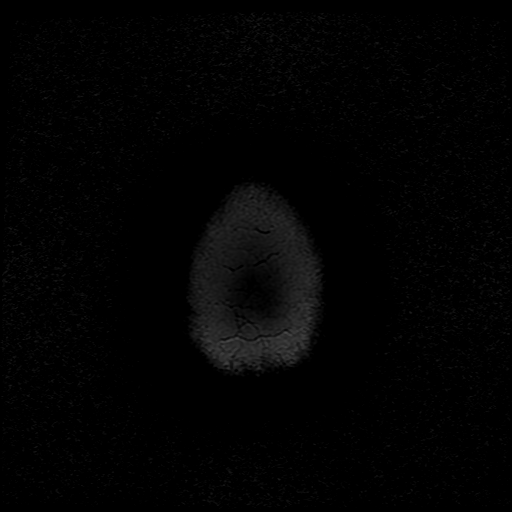

[Series 9: T1 · axial · 1.0mm · 0.47mm/px · z∈[-92,+4]mm · 5 of 162 slices shown (2 of 2)]
[im 1/162]
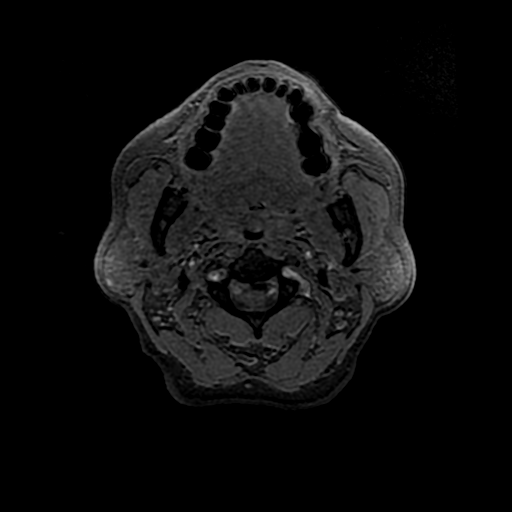
[im 33/162]
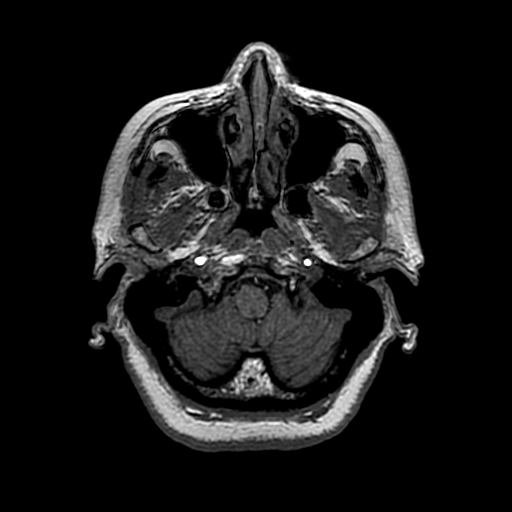
[im 49/162]
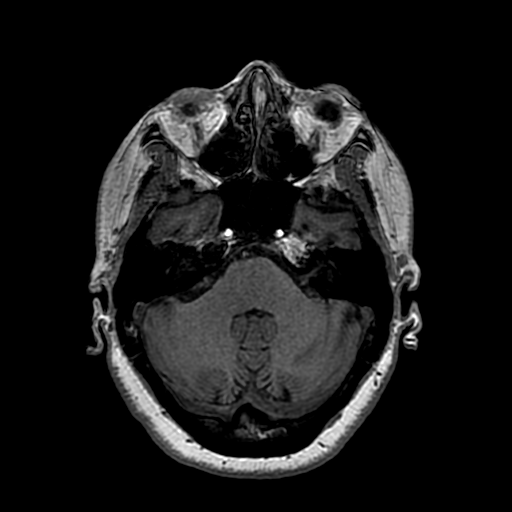
[im 65/162]
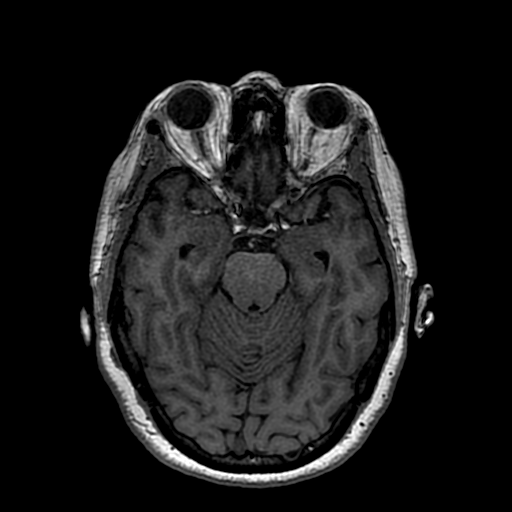
[im 97/162]
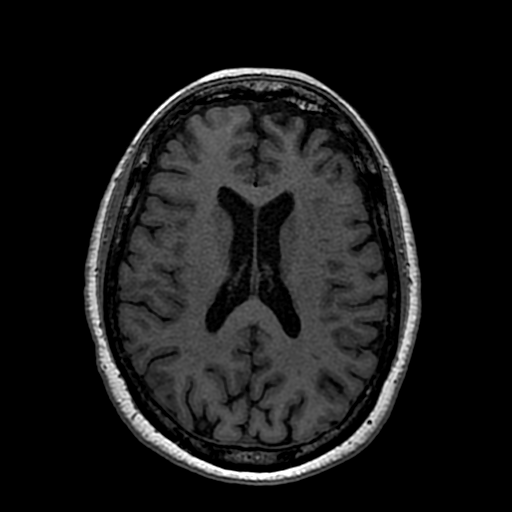

[Series 10: DWI · coronal · 4.0mm · 1.09mm/px · 6 of 84 slices shown (2 of 4)]
[im 1/84]
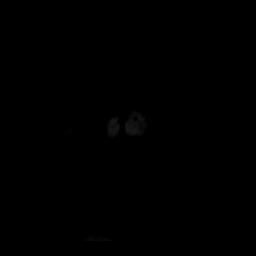
[im 17/84]
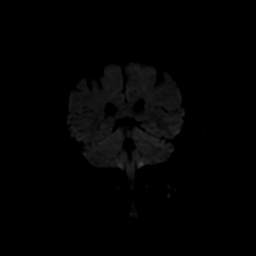
[im 34/84]
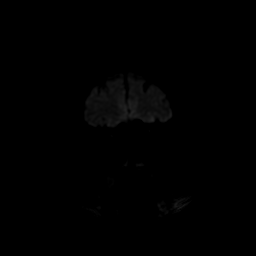
[im 50/84]
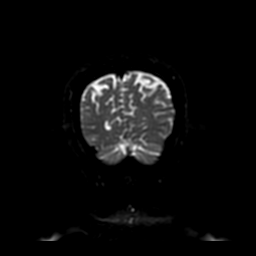
[im 67/84]
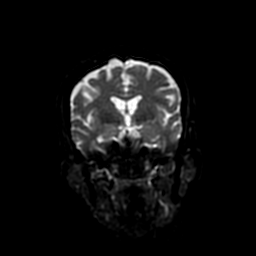
[im 84/84]
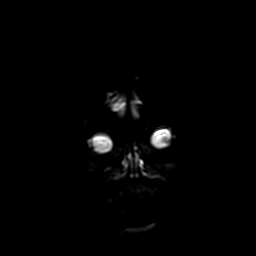

[Series 11: T2 post-contrast · coronal · 5.0mm · 0.45mm/px · 2 of 27 slices shown]
[im 1/27]
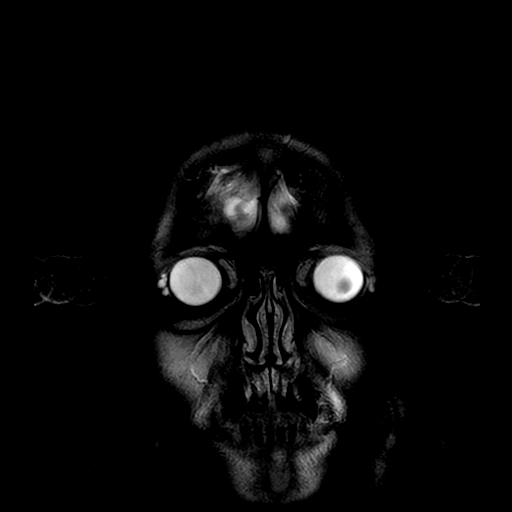
[im 27/27]
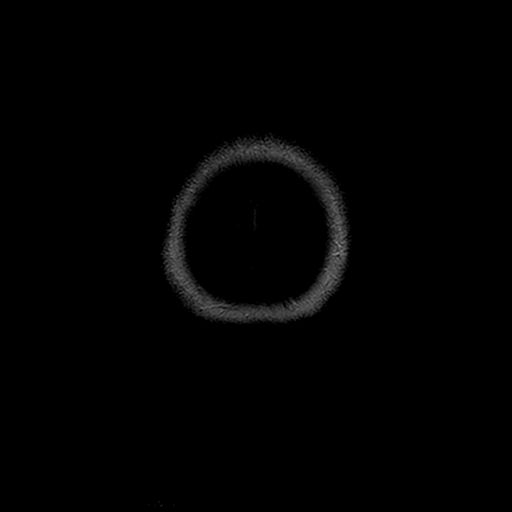

[Series 300: DWI · axial · 3.0mm · 1.09mm/px · z∈[-87,+62]mm · 3 of 51 slices shown (3 of 4)]
[im 1/51]
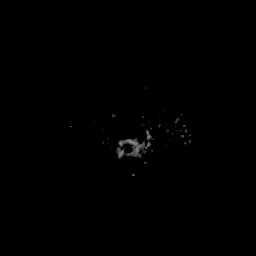
[im 26/51]
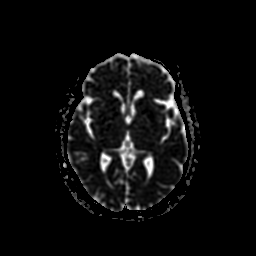
[im 51/51]
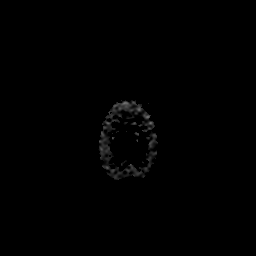

[Series 1000: DWI · coronal · 4.0mm · 1.09mm/px · 3 of 42 slices shown (4 of 4)]
[im 1/42]
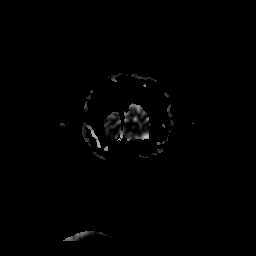
[im 21/42]
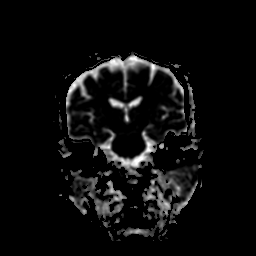
[im 42/42]
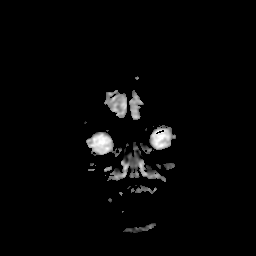

[31 of 48 positions shown; findings below may reference images not displayed]

FINDINGS: MRI HEAD FINDINGS

Brain: No acute infarct, hemorrhage, or mass lesion is present.
Scattered subcortical T2 hyperintensities are within normal limits
for age. Mild atrophy is also normal for age. The ventricles are of
normal size. No significant extraaxial fluid collection is present.
The internal auditory canals are within normal limits. The brainstem
and cerebellum are within normal limits.

Vascular: Flow is present in the major intracranial arteries. The
globes and orbits are within normal limits.

Skull and upper cervical spine: The craniocervical junction is
normal. Upper cervical spine is within normal limits. Marrow signal
is unremarkable.

Sinuses/Orbits: The paranasal sinuses and mastoid air cells are
clear. The globes and orbits are within normal limits.

MRA HEAD FINDINGS

A 2 mm medially directed paraophthalmic artery aneurysm is present
on the left. The internal carotid arteries are otherwise within
normal limits bilaterally. Right A1 segment is dominant. The
anterior communicating artery is patent. MCA bifurcations are
intact. The ACA and MCA branch vessels are normal.

The left vertebral artery is the dominant vessel. PICA origins are
visualized and normal. The basilar artery is normal. Both posterior
cerebral arteries originate from the basilar tip. PCA branch vessels
are normal bilaterally.
IMPRESSION: 1. Normal MRI the brain for age. No acute or focal lesion to explain
headaches or dizziness.
2. 2 mm left paraophthalmic artery aneurysm. Recommend 6-12 month
follow-up with MRA to assure stability.
3. MRA circle-of-Willis is otherwise within limits.

## 2020-12-19 DIAGNOSIS — H02104 Unspecified ectropion of left upper eyelid: Secondary | ICD-10-CM | POA: Diagnosis not present

## 2020-12-19 DIAGNOSIS — H02101 Unspecified ectropion of right upper eyelid: Secondary | ICD-10-CM | POA: Diagnosis not present

## 2020-12-19 DIAGNOSIS — H02105 Unspecified ectropion of left lower eyelid: Secondary | ICD-10-CM | POA: Diagnosis not present

## 2020-12-19 DIAGNOSIS — H43812 Vitreous degeneration, left eye: Secondary | ICD-10-CM | POA: Diagnosis not present

## 2020-12-19 DIAGNOSIS — H02102 Unspecified ectropion of right lower eyelid: Secondary | ICD-10-CM | POA: Diagnosis not present

## 2020-12-19 DIAGNOSIS — H43822 Vitreomacular adhesion, left eye: Secondary | ICD-10-CM | POA: Diagnosis not present

## 2020-12-27 ENCOUNTER — Other Ambulatory Visit: Payer: Self-pay

## 2020-12-27 ENCOUNTER — Ambulatory Visit: Payer: Medicare PPO | Admitting: Family Medicine

## 2020-12-27 ENCOUNTER — Encounter: Payer: Self-pay | Admitting: Family Medicine

## 2020-12-27 VITALS — BP 116/70 | HR 76 | Temp 98.2°F | Resp 18 | Ht 61.0 in | Wt 146.2 lb

## 2020-12-27 DIAGNOSIS — R350 Frequency of micturition: Secondary | ICD-10-CM

## 2020-12-27 DIAGNOSIS — I10 Essential (primary) hypertension: Secondary | ICD-10-CM

## 2020-12-27 DIAGNOSIS — R42 Dizziness and giddiness: Secondary | ICD-10-CM | POA: Diagnosis not present

## 2020-12-27 DIAGNOSIS — E785 Hyperlipidemia, unspecified: Secondary | ICD-10-CM

## 2020-12-27 DIAGNOSIS — H81399 Other peripheral vertigo, unspecified ear: Secondary | ICD-10-CM | POA: Diagnosis not present

## 2020-12-27 DIAGNOSIS — E559 Vitamin D deficiency, unspecified: Secondary | ICD-10-CM | POA: Diagnosis not present

## 2020-12-27 DIAGNOSIS — I671 Cerebral aneurysm, nonruptured: Secondary | ICD-10-CM | POA: Diagnosis not present

## 2020-12-27 LAB — POC URINALSYSI DIPSTICK (AUTOMATED)
Bilirubin, UA: NEGATIVE
Blood, UA: NEGATIVE
Glucose, UA: NEGATIVE
Ketones, UA: NEGATIVE
Leukocytes, UA: NEGATIVE
Nitrite, UA: NEGATIVE
Protein, UA: NEGATIVE
Spec Grav, UA: 1.025 (ref 1.010–1.025)
Urobilinogen, UA: 0.2 E.U./dL
pH, UA: 6 (ref 5.0–8.0)

## 2020-12-27 LAB — VITAMIN B12: Vitamin B-12: 373 pg/mL (ref 211–911)

## 2020-12-27 LAB — COMPREHENSIVE METABOLIC PANEL
ALT: 17 U/L (ref 0–35)
AST: 13 U/L (ref 0–37)
Albumin: 4.3 g/dL (ref 3.5–5.2)
Alkaline Phosphatase: 48 U/L (ref 39–117)
BUN: 18 mg/dL (ref 6–23)
CO2: 32 mEq/L (ref 19–32)
Calcium: 9.9 mg/dL (ref 8.4–10.5)
Chloride: 103 mEq/L (ref 96–112)
Creatinine, Ser: 1.03 mg/dL (ref 0.40–1.20)
GFR: 51.56 mL/min — ABNORMAL LOW (ref >60.00)
Glucose, Bld: 72 mg/dL (ref 70–99)
Potassium: 4.5 mEq/L (ref 3.5–5.1)
Sodium: 140 mEq/L (ref 135–145)
Total Bilirubin: 0.6 mg/dL (ref 0.2–1.2)
Total Protein: 6.9 g/dL (ref 6.0–8.3)

## 2020-12-27 LAB — CBC WITH DIFFERENTIAL/PLATELET
Basophils Absolute: 0.1 10*3/uL (ref 0.0–0.1)
Basophils Relative: 1.4 % (ref 0.0–3.0)
Eosinophils Absolute: 0.1 10*3/uL (ref 0.0–0.7)
Eosinophils Relative: 3.6 % (ref 0.0–5.0)
HCT: 40.6 % (ref 36.0–46.0)
Hemoglobin: 13.5 g/dL (ref 12.0–15.0)
Lymphocytes Relative: 37.6 % (ref 12.0–46.0)
Lymphs Abs: 1.5 10*3/uL (ref 0.7–4.0)
MCHC: 33.3 g/dL (ref 30.0–36.0)
MCV: 89.5 fl (ref 78.0–100.0)
Monocytes Absolute: 0.4 10*3/uL (ref 0.1–1.0)
Monocytes Relative: 9.7 % (ref 3.0–12.0)
Neutro Abs: 1.9 10*3/uL (ref 1.4–7.7)
Neutrophils Relative %: 47.7 % (ref 43.0–77.0)
Platelets: 265 10*3/uL (ref 150.0–400.0)
RBC: 4.54 Mil/uL (ref 3.87–5.11)
RDW: 14 % (ref 11.5–15.5)
WBC: 3.9 10*3/uL — ABNORMAL LOW (ref 4.0–10.5)

## 2020-12-27 LAB — VITAMIN D 25 HYDROXY (VIT D DEFICIENCY, FRACTURES): VITD: 48.22 ng/mL (ref 30.00–100.00)

## 2020-12-27 LAB — MAGNESIUM: Magnesium: 1.8 mg/dL (ref 1.5–2.5)

## 2020-12-27 LAB — LIPID PANEL
Cholesterol: 137 mg/dL (ref 0–200)
HDL: 62.2 mg/dL (ref >39.00)
LDL Cholesterol: 56 mg/dL (ref 0–99)
NonHDL: 74.9
Total CHOL/HDL Ratio: 2
Triglycerides: 97 mg/dL (ref 0.0–149.0)
VLDL: 19.4 mg/dL (ref 0.0–40.0)

## 2020-12-27 LAB — TSH: TSH: 0.52 u[IU]/mL (ref 0.35–4.50)

## 2020-12-27 MED ORDER — MECLIZINE HCL 12.5 MG PO TABS: 12.5000 mg | ORAL_TABLET | Freq: Three times a day (TID) | ORAL | 2 refills | Status: DC | PRN

## 2020-12-27 NOTE — Assessment & Plan Note (Signed)
Pt thought she might be dehydrated and started drinking a lot of water due to her dizziness so she was urinating more ua normal

## 2020-12-27 NOTE — Progress Notes (Signed)
Patient ID: Faith Webb, female    DOB: Jul 10, 1941  Age: 80 y.o. MRN: [id number]    Subjective:  Subjective  HPI Faith Webb presents for f/u bp , cholesterol but c/o her dizziness being worse.  She needs to f/u neuro for aneurysm but he found no neuro reason for dizziness.   No other complaints   Review of Systems  Constitutional: Negative for appetite change, diaphoresis, fatigue and unexpected weight change.  Eyes: Negative for pain, redness and visual disturbance.  Respiratory: Negative for cough, chest tightness, shortness of breath and wheezing.   Cardiovascular: Negative for chest pain, palpitations and leg swelling.  Endocrine: Negative for cold intolerance, heat intolerance, polydipsia, polyphagia and polyuria.  Genitourinary: Negative for difficulty urinating, dysuria and frequency.  Neurological: Positive for dizziness. Negative for light-headedness, numbness and headaches.    History Past Medical History:  Diagnosis Date  . Allergy   . Arthritis   . Hyperlipidemia   . Hypertension     She has a past surgical history that includes Abdominal hysterectomy; Breast surgery; and Breast biopsy (Bilateral).   Her family history includes Cancer in her sister; Diabetes in her brother, father, mother, sister, sister, and sister; Healthy in her child; Heart disease in her mother; Hyperlipidemia in her brother, sister, and sister; Hypertension in her mother and sister; Kidney disease in her sister; Stroke in her brother, mother, and sister.She reports that she has never smoked. She has never used smokeless tobacco. She reports that she does not drink alcohol and does not use drugs.  Current Outpatient Medications on File Prior to Visit  Medication Sig Dispense Refill  . hydrochlorothiazide (HYDRODIURIL) 25 MG tablet TAKE 1 TABLET(25 MG) BY MOUTH DAILY 90 tablet 1  . Magnesium Oxide 420 MG TABS Take 0.5 tablets by mouth daily.     . metoprolol tartrate (LOPRESSOR) 25 MG tablet Take  25 mg by mouth 2 (two) times daily.    . Omega-3 Fatty Acids (THE VERY FINEST FISH OIL) LIQD Take by mouth.    Marland Kitchen omeprazole (PRILOSEC) 20 MG capsule Take 20 mg by mouth daily as needed.    . potassium chloride (KLOR-CON) 10 MEQ tablet TAKE 3 TABLETS(30 MEQ) BY MOUTH TWICE DAILY 270 tablet 1  . rosuvastatin (CRESTOR) 10 MG tablet TAKE 1 TABLET(10 MG) BY MOUTH AT BEDTIME 90 tablet 1  . Vitamin D, Ergocalciferol, (DRISDOL) 1.25 MG (50000 UNIT) CAPS capsule TAKE 1 CAPSULE BY MOUTH EVERY WEEK 12 capsule 3   No current facility-administered medications on file prior to visit.     Objective:  Objective  Physical Exam Vitals and nursing note reviewed.  Constitutional:      Appearance: She is well-developed and well-nourished.  HENT:     Head: Normocephalic and atraumatic.     Right Ear: Tympanic membrane, ear canal and external ear normal.     Left Ear: Tympanic membrane, ear canal and external ear normal.  Eyes:     Extraocular Movements: EOM normal.     Conjunctiva/sclera: Conjunctivae normal.  Neck:     Thyroid: No thyromegaly.     Vascular: No carotid bruit or JVD.  Cardiovascular:     Rate and Rhythm: Normal rate and regular rhythm.     Heart sounds: Normal heart sounds. No murmur heard.   Pulmonary:     Effort: Pulmonary effort is normal. No respiratory distress.     Breath sounds: Normal breath sounds. No wheezing or rales.  Chest:     Chest wall:  No tenderness.  Musculoskeletal:        General: No edema.     Cervical back: Normal range of motion and neck supple.  Neurological:     Mental Status: She is alert and oriented to person, place, and time.  Psychiatric:        Mood and Affect: Mood and affect normal.    BP 116/70 (BP Location: Right Arm, Patient Position: Sitting, Cuff Size: Normal)   Pulse 76   Temp 98.2 F (36.8 C) (Oral)   Resp 18   Ht 5\' 1"  (1.549 m)   Wt 146 lb 3.2 oz (66.3 kg)   SpO2 99%   BMI 27.62 kg/m  Wt Readings from Last 3 Encounters:   12/27/20 146 lb 3.2 oz (66.3 kg)  08/15/20 148 lb 12.8 oz (67.5 kg)  03/03/20 140 lb (63.5 kg)     Lab Results  Component Value Date   WBC 5.4 05/08/2019   HGB 13.8 05/08/2019   HCT 40.0 05/08/2019   PLT 304 05/08/2019   GLUCOSE 86 08/15/2020   CHOL 138 08/15/2020   TRIG 131 08/15/2020   HDL 64 08/15/2020   LDLCALC 53 08/15/2020   ALT 15 08/15/2020   AST 13 08/15/2020   NA 141 08/15/2020   K 4.1 08/15/2020   CL 104 08/15/2020   CREATININE 0.97 (H) 08/15/2020   BUN 15 08/15/2020   CO2 32 08/15/2020   TSH 0.61 01/28/2020    MR Angiogram Head Wo Contrast  Result Date: 02/07/2020 CLINICAL DATA:  Peripheral vertigo with cerebral aneurysm. Follow-up. EXAM: MRA HEAD WITHOUT CONTRAST TECHNIQUE: Angiographic images of the Circle of Willis were obtained using MRA technique without intravenous contrast. COMPARISON:  08/07/2019 FINDINGS: The carotid arteries are smooth and widely patent. Atheromatous type irregularity and narrowing of proximal right M1 segment. 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 Hypoplastic left A1 segment. No branch occlusion or generalized beading. IMPRESSION: Unchanged 2 mm left periophthalmic aneurysm. Electronically Signed   By: 10/07/2019 M.D.   On: 02/07/2020 11:08     Assessment & Plan:  Plan  I have discontinued Winna L. Dolinger "[name]"'s levocetirizine. I am also having her maintain her metoprolol tartrate, The Very Finest Fish Oil, Magnesium Oxide, omeprazole, Vitamin D (Ergocalciferol), hydrochlorothiazide, rosuvastatin, potassium chloride, and meclizine.  Meds ordered this encounter  Medications  . meclizine (ANTIVERT) 12.5 MG tablet    Sig: Take 1 tablet (12.5 mg total) by mouth 3 (three) times daily as needed for dizziness.    Dispense:  30 tablet    Refill:  2    Problem List Items Addressed This Visit      Unprioritized   Hyperlipidemia   Relevant Orders   Lipid panel   Comprehensive metabolic  panel    Other Visit Diagnoses    Urinary frequency    -  Primary   Relevant Orders   POCT Urinalysis Dipstick (Automated) (Completed)   Other peripheral vertigo, unspecified ear       Relevant Medications   meclizine (ANTIVERT) 12.5 MG tablet   Vitamin D deficiency       Relevant Orders   Vitamin D (25 hydroxy)   Vertigo       Relevant Orders   CBC with Differential/Platelet   TSH   Vitamin B12   Ambulatory referral to ENT   Hypomagnesemia       Relevant Orders   Magnesium      Follow-up: Return in about 6 months (  around 06/26/2021), or if symptoms worsen or fail to improve, for hyperlipidemia.  [name], DO

## 2020-12-27 NOTE — Patient Instructions (Signed)

## 2020-12-27 NOTE — Assessment & Plan Note (Signed)
Well controlled, no changes to meds. Encouraged heart healthy diet such as the DASH diet and exercise as tolerated.  °

## 2020-12-27 NOTE — Assessment & Plan Note (Signed)
No neuro or cardio reason found Refer to ent  Refill meclizine

## 2020-12-27 NOTE — Assessment & Plan Note (Signed)
Tolerating statin, encouraged heart healthy diet, avoid trans fats, minimize simple carbs and saturated fats. Increase exercise as tolerated 

## 2020-12-27 NOTE — Assessment & Plan Note (Signed)
F/u neuro in April

## 2020-12-29 ENCOUNTER — Other Ambulatory Visit: Payer: Medicare PPO

## 2020-12-29 ENCOUNTER — Other Ambulatory Visit: Payer: Self-pay

## 2020-12-29 DIAGNOSIS — Z20822 Contact with and (suspected) exposure to covid-19: Secondary | ICD-10-CM

## 2020-12-30 LAB — SARS-COV-2, NAA 2 DAY TAT

## 2020-12-30 LAB — NOVEL CORONAVIRUS, NAA: SARS-CoV-2, NAA: NOT DETECTED

## 2021-01-23 ENCOUNTER — Other Ambulatory Visit: Payer: Self-pay

## 2021-01-23 ENCOUNTER — Ambulatory Visit
Admission: RE | Admit: 2021-01-23 | Discharge: 2021-01-23 | Disposition: A | Discharge status: Home / Self Care | Payer: Medicare PPO | Source: Ambulatory Visit | Attending: Neurology | Admitting: Neurology

## 2021-01-23 DIAGNOSIS — I728 Aneurysm of other specified arteries: Secondary | ICD-10-CM | POA: Diagnosis not present

## 2021-01-23 DIAGNOSIS — R42 Dizziness and giddiness: Secondary | ICD-10-CM | POA: Diagnosis not present

## 2021-01-23 DIAGNOSIS — I671 Cerebral aneurysm, nonruptured: Secondary | ICD-10-CM

## 2021-01-23 DIAGNOSIS — Q283 Other malformations of cerebral vessels: Secondary | ICD-10-CM | POA: Diagnosis not present

## 2021-01-24 ENCOUNTER — Telehealth: Payer: Self-pay

## 2021-01-24 DIAGNOSIS — I671 Cerebral aneurysm, nonruptured: Secondary | ICD-10-CM

## 2021-01-24 NOTE — Telephone Encounter (Signed)
LMOVM for pt, Please give [place] a call in regards to your MRA results.

## 2021-01-24 NOTE — Telephone Encounter (Signed)
-----   Message from Drema Dallas, DO sent at 01/23/2021  3:51 PM EST ----- MRA of head is stable.  The aneurysm is small and unchanged in size.  I would still repeat MRA of head in one year.  However, please have her schedule an appointment after the repeat exam in a year.

## 2021-01-25 ENCOUNTER — Telehealth: Payer: Self-pay

## 2021-01-25 NOTE — Telephone Encounter (Signed)
Pt advised to repeat MRA in a year.  And to schedule her f/u in one yr with dr.Jaffe.   Front desk please call pt to schedule.

## 2021-01-25 NOTE — Telephone Encounter (Signed)
-----   Message from Adam R Jaffe, DO sent at 01/23/2021  3:51 PM EST ----- MRA of head is stable.  The aneurysm is small and unchanged in size.  I would still repeat MRA of head in one year.  However, please have her schedule an appointment after the repeat exam in a year. 

## 2021-01-27 DIAGNOSIS — H02834 Dermatochalasis of left upper eyelid: Secondary | ICD-10-CM | POA: Diagnosis not present

## 2021-01-27 DIAGNOSIS — H02831 Dermatochalasis of right upper eyelid: Secondary | ICD-10-CM | POA: Diagnosis not present

## 2021-01-27 DIAGNOSIS — H04552 Acquired stenosis of left nasolacrimal duct: Secondary | ICD-10-CM | POA: Diagnosis not present

## 2021-01-27 DIAGNOSIS — H04123 Dry eye syndrome of bilateral lacrimal glands: Secondary | ICD-10-CM | POA: Diagnosis not present

## 2021-02-02 ENCOUNTER — Other Ambulatory Visit: Payer: Self-pay | Admitting: Family Medicine

## 2021-02-02 DIAGNOSIS — E785 Hyperlipidemia, unspecified: Secondary | ICD-10-CM

## 2021-02-02 DIAGNOSIS — I10 Essential (primary) hypertension: Secondary | ICD-10-CM

## 2021-02-07 ENCOUNTER — Other Ambulatory Visit: Payer: Medicare PPO

## 2021-02-07 DIAGNOSIS — H2512 Age-related nuclear cataract, left eye: Secondary | ICD-10-CM | POA: Diagnosis not present

## 2021-02-07 DIAGNOSIS — H2513 Age-related nuclear cataract, bilateral: Secondary | ICD-10-CM | POA: Diagnosis not present

## 2021-02-13 ENCOUNTER — Ambulatory Visit: Payer: Medicare PPO | Admitting: Family Medicine

## 2021-02-13 ENCOUNTER — Other Ambulatory Visit: Payer: Medicare PPO

## 2021-02-13 ENCOUNTER — Ambulatory Visit: Payer: Medicare PPO | Attending: Critical Care Medicine

## 2021-02-13 DIAGNOSIS — Z20822 Contact with and (suspected) exposure to covid-19: Secondary | ICD-10-CM

## 2021-02-14 LAB — NOVEL CORONAVIRUS, NAA: SARS-CoV-2, NAA: NOT DETECTED

## 2021-02-14 LAB — SARS-COV-2, NAA 2 DAY TAT

## 2021-02-22 ENCOUNTER — Telehealth: Payer: Self-pay | Admitting: Family Medicine

## 2021-02-22 NOTE — Telephone Encounter (Signed)
Medication:   metoprolol tartrate (LOPRESSOR) 25 MG tablet [[id number]]     Has the patient contacted their pharmacy? Yes    Preferred Pharmacy (with phone number or street name): [facility] [place] [address]

## 2021-02-22 NOTE — Telephone Encounter (Signed)
Medication on list without authorizing provider or last fill date? Medication not in history. Please advise

## 2021-02-23 ENCOUNTER — Other Ambulatory Visit: Payer: Self-pay

## 2021-02-23 MED ORDER — METOPROLOL TARTRATE 25 MG PO TABS: 25.0000 mg | ORAL_TABLET | Freq: Two times a day (BID) | ORAL | 2 refills | Status: DC

## 2021-02-23 NOTE — Telephone Encounter (Signed)
Double check with pt that she requested it and then ok to refill --- she should have app in July/ august aready set up

## 2021-02-24 NOTE — Telephone Encounter (Signed)
Spoke with patient. Pt states being on medication for some time and it appears that medication was filled by [place] on 01/24/2021

## 2021-02-28 DIAGNOSIS — H2512 Age-related nuclear cataract, left eye: Secondary | ICD-10-CM | POA: Diagnosis not present

## 2021-02-28 DIAGNOSIS — H25812 Combined forms of age-related cataract, left eye: Secondary | ICD-10-CM | POA: Diagnosis not present

## 2021-02-28 DIAGNOSIS — H268 Other specified cataract: Secondary | ICD-10-CM | POA: Diagnosis not present

## 2021-03-09 ENCOUNTER — Telehealth: Payer: Self-pay | Admitting: Family Medicine

## 2021-03-09 MED ORDER — POTASSIUM CHLORIDE ER 10 MEQ PO TBCR: EXTENDED_RELEASE_TABLET | ORAL | 1 refills | Status: DC

## 2021-03-09 NOTE — Telephone Encounter (Signed)
Medication: potassium chloride (KLOR-CON) 10 MEQ tablet Medication      Has the patient contacted their pharmacy? No. (If no, request that the patient contact the pharmacy for the refill.) (If yes, when and what did the pharmacy advise?)  Preferred Pharmacy (with phone number or street name):  [id number] [place] [address], [place] - [address] AT [facility] [address]  [address], [place] [address]  Phone:  [phone] Fax:  [phone]  DEA #:  [id number]  DAW Reason: --    Agent: Please be advised that RX refills may take up to 3 business days. We ask that you follow-up with your pharmacy.

## 2021-03-09 NOTE — Telephone Encounter (Signed)
Rx sent 

## 2021-04-13 DIAGNOSIS — H903 Sensorineural hearing loss, bilateral: Secondary | ICD-10-CM | POA: Diagnosis not present

## 2021-04-13 DIAGNOSIS — H6123 Impacted cerumen, bilateral: Secondary | ICD-10-CM | POA: Diagnosis not present

## 2021-04-13 DIAGNOSIS — R42 Dizziness and giddiness: Secondary | ICD-10-CM | POA: Diagnosis not present

## 2021-04-14 ENCOUNTER — Other Ambulatory Visit: Payer: Self-pay

## 2021-04-14 ENCOUNTER — Telehealth: Payer: Self-pay

## 2021-04-14 NOTE — Progress Notes (Signed)
Opened by mistake- [facility] 

## 2021-04-14 NOTE — Telephone Encounter (Signed)
Okay to hold

## 2021-04-14 NOTE — Telephone Encounter (Signed)
That is fine 

## 2021-04-14 NOTE — Telephone Encounter (Signed)
Pt called stating that she is having test a for hearing and balance with Dr. Elmer Picker and was told hold on dieretics for 48 hours and wants to confirms that is ok for her to do so. Please advise.

## 2021-04-14 NOTE — Telephone Encounter (Signed)
Pt is aware and voices understanding.  

## 2021-04-25 DIAGNOSIS — R42 Dizziness and giddiness: Secondary | ICD-10-CM | POA: Diagnosis not present

## 2021-05-01 ENCOUNTER — Other Ambulatory Visit: Payer: Self-pay

## 2021-05-01 DIAGNOSIS — E785 Hyperlipidemia, unspecified: Secondary | ICD-10-CM

## 2021-05-01 DIAGNOSIS — I10 Essential (primary) hypertension: Secondary | ICD-10-CM

## 2021-05-01 MED ORDER — ROSUVASTATIN CALCIUM 10 MG PO TABS: ORAL_TABLET | ORAL | 1 refills | Status: DC

## 2021-05-02 ENCOUNTER — Encounter: Payer: Self-pay | Admitting: Family Medicine

## 2021-05-03 ENCOUNTER — Ambulatory Visit (INDEPENDENT_AMBULATORY_CARE_PROVIDER_SITE_OTHER): Payer: Medicare PPO

## 2021-05-03 VITALS — Ht 61.0 in | Wt 146.0 lb

## 2021-05-03 DIAGNOSIS — Z0000 Encounter for general adult medical examination without abnormal findings: Secondary | ICD-10-CM

## 2021-05-03 DIAGNOSIS — Z1231 Encounter for screening mammogram for malignant neoplasm of breast: Secondary | ICD-10-CM | POA: Diagnosis not present

## 2021-05-03 DIAGNOSIS — R42 Dizziness and giddiness: Secondary | ICD-10-CM | POA: Diagnosis not present

## 2021-05-03 DIAGNOSIS — H9041 Sensorineural hearing loss, unilateral, right ear, with unrestricted hearing on the contralateral side: Secondary | ICD-10-CM | POA: Diagnosis not present

## 2021-05-03 DIAGNOSIS — H832X3 Labyrinthine dysfunction, bilateral: Secondary | ICD-10-CM | POA: Diagnosis not present

## 2021-05-03 NOTE — Progress Notes (Signed)
Subjective:   Faith Webb is a 80 y.o. female who presents for Medicare Annual (Subsequent) preventive examination.  I connected with  Chennel today by a video enabled telemedicine application and verified that I am speaking with the correct person using two identifiers.  Location of patient:Home Location of provider:Work  Persons participating in the virtual visit: patient, nurse.   I discussed the limitations, risk, security and privacy concerns of evaluation and management by telemedicine. The patient expressed understanding and agreed to proceed.  Some vital signs may be absent or patient reported.    Review of Systems     Cardiac Risk Factors include: advanced age (>30men, >64 women);hypertension;dyslipidemia     Objective:    Today's Vitals   05/03/21 0940  Weight: 146 lb (66.2 kg)  Height: 5\' 1"  (1.549 m)   Body mass index is 27.59 kg/m.  Advanced Directives 05/03/2021 03/03/2020 02/09/2020 09/09/2019 05/11/2019 08/24/2018  Does Patient Have a Medical Advance Directive? Yes Yes Yes Yes Yes No  Type of Advance Directive Living will;Healthcare Power of 08/26/2018 Power of [place];Living will Healthcare Power of [place];Living will Living will;Healthcare Power of Attorney - -  Does patient want to make changes to medical advance directive? - - No - Patient declined - No - Patient declined -  Copy of Healthcare Power of Attorney in Chart? No - copy requested - No - copy requested - - -  Would patient like information on creating a medical advance directive? - - - - - No - Patient declined    Current Medications (verified) Outpatient Encounter Medications as of 05/03/2021  Medication Sig  . hydrochlorothiazide (HYDRODIURIL) 25 MG tablet TAKE 1 TABLET(25 MG) BY MOUTH DAILY  . Magnesium Oxide 420 MG TABS Take 0.5 tablets by mouth daily.   . meclizine (ANTIVERT) 12.5 MG tablet Take 1 tablet (12.5 mg total) by mouth 3 (three) times daily as needed for dizziness.  .  metoprolol tartrate (LOPRESSOR) 25 MG tablet Take 1 tablet (25 mg total) by mouth 2 (two) times daily.  . Omega-3 Fatty Acids (THE VERY FINEST FISH OIL) LIQD Take by mouth.  07/03/2021 omeprazole (PRILOSEC) 20 MG capsule Take 20 mg by mouth daily as needed.  . potassium chloride (KLOR-CON) 10 MEQ tablet TAKE 3 TABLETS(30 MEQ) BY MOUTH TWICE DAILY  . rosuvastatin (CRESTOR) 10 MG tablet TAKE 1 TABLET(10 MG) BY MOUTH AT BEDTIME  . Vitamin D, Ergocalciferol, (DRISDOL) 1.25 MG (50000 UNIT) CAPS capsule TAKE 1 CAPSULE BY MOUTH EVERY WEEK   No facility-administered encounter medications on file as of 05/03/2021.    Allergies (verified) Amoxicillin   History: Past Medical History:  Diagnosis Date  . Allergy   . Arthritis   . Hyperlipidemia   . Hypertension    Past Surgical History:  Procedure Laterality Date  . ABDOMINAL HYSTERECTOMY    . BREAST BIOPSY Bilateral   . BREAST SURGERY    . EYE SURGERY     Family History  Problem Relation Age of Onset  . Diabetes Mother   . Heart disease Mother   . Hypertension Mother   . Stroke Mother   . Diabetes Father   . Cancer Sister   . Diabetes Sister   . Diabetes Brother   . Hyperlipidemia Brother   . Stroke Brother   . Diabetes Sister   . Hyperlipidemia Sister   . Hypertension Sister   . Kidney disease Sister   . Stroke Sister   . Diabetes Sister   . Hyperlipidemia  Sister   . Healthy Child    Social History   Socioeconomic History  . Marital status: Widowed    Spouse name: Not on file  . Number of children: 2  . Years of education: Not on file  . Highest education level: Bachelor's degree (e.g., BA, AB, BS)  Occupational History  . Not on file  Tobacco Use  . Smoking status: Never Smoker  . Smokeless tobacco: Never Used  Vaping Use  . Vaping Use: Never used  Substance and Sexual Activity  . Alcohol use: Never  . Drug use: Never  . Sexual activity: Not on file  Other Topics Concern  . Not on file  Social History Narrative   Pt  lives alone in 1 story home, she has 2 children, right handed, she drinks coffee daily, tea/ soda occasionally.   Social Determinants of Health   Financial Resource Strain: Low Risk   . Difficulty of Paying Living Expenses: Not hard at all  Food Insecurity: No Food Insecurity  . Worried About Programme researcher, broadcasting/film/video in the Last Year: Never true  . Ran Out of Food in the Last Year: Never true  Transportation Needs: No Transportation Needs  . Lack of Transportation (Medical): No  . Lack of Transportation (Non-Medical): No  Physical Activity: Sufficiently Active  . Days of Exercise per Week: 3 days  . Minutes of Exercise per Session: 60 min  Stress: No Stress Concern Present  . Feeling of Stress : Not at all  Social Connections: Moderately Integrated  . Frequency of Communication with Friends and Family: More than three times a week  . Frequency of Social Gatherings with Friends and Family: More than three times a week  . Attends Religious Services: More than 4 times per year  . Active Member of Clubs or Organizations: Yes  . Attends Banker Meetings: More than 4 times per year  . Marital Status: Widowed    Tobacco Counseling Counseling given: Not Answered   Clinical Intake:  Pre-visit preparation completed: Yes  Pain : No/denies pain     Nutritional Status: BMI 25 -29 Overweight Diabetes: No  How often do you need to have someone help you when you read instructions, pamphlets, or other written materials from your doctor or pharmacy?: 1 - Never  Diabetic?No  Interpreter Needed?: No  Information entered by :: Thomasenia Sales LPN   Activities of Daily Living In your present state of health, do you have any difficulty performing the following activities: 05/03/2021  Hearing? N  Vision? N  Difficulty concentrating or making decisions? N  Walking or climbing stairs? N  Dressing or bathing? N  Doing errands, shopping? N  Preparing Food and eating ? N  Using the  Toilet? N  In the past six months, have you accidently leaked urine? N  Do you have problems with loss of bowel control? N  Managing your Medications? N  Managing your Finances? N  Housekeeping or managing your Housekeeping? N  Some recent data might be hidden    Patient Care Team: Zola Button, Grayling Congress, DO as PCP - General (Family Medicine)  Indicate any recent Medical Services you may have received from other than [facility] providers in the past year (date may be approximate).     Assessment:   This is a routine wellness examination for [facility].  Hearing/Vision screen  Hearing Screening   125Hz  250Hz  500Hz  1000Hz  2000Hz  3000Hz  4000Hz  6000Hz  8000Hz   Right ear:  Left ear:           Comments: No issues  Vision Screening Comments: Last eye exam-03/2021-Dr.Glenn Reading glasses  Dietary issues and exercise activities discussed: Current Exercise Habits: Structured exercise class, Type of exercise: Other - see comments (exercise classes), Time (Minutes): 60, Frequency (Times/Week): 3, Weekly Exercise (Minutes/Week): 180, Intensity: Mild, Exercise limited by: None identified  Goals Addressed            This Visit's Progress   . Increase physical activity   On track     Depression Screen PHQ 2/9 Scores 05/03/2021 02/09/2020  PHQ - 2 Score 0 0    Fall Risk Fall Risk  05/03/2021 03/03/2020 02/09/2020 09/09/2019  Falls in the past year? 0 0 0 0  Number falls in past yr: 0 0 0 0  Injury with Fall? 0 0 0 0  Risk for fall due to : - No Fall Risks - -  Follow up Falls prevention discussed - Education provided;Falls prevention discussed -    FALL RISK PREVENTION PERTAINING TO THE HOME:  Any stairs in or around the home? Yes  If so, are there any without handrails? No  Home free of loose throw rugs in walkways, pet beds, electrical cords, etc? Yes  Adequate lighting in your home to reduce risk of falls? Yes   ASSISTIVE DEVICES UTILIZED TO PREVENT FALLS:  Life alert? No  Use of  a cane, walker or w/c? No  Grab bars in the bathroom? Yes  Shower chair or bench in shower? No  Elevated toilet seat or a handicapped toilet? No   TIMED UP AND GO:  Was the test performed? No . Virtual visit   Cognitive Function:Normal cognitive status assessed by direct observation by this Nurse Health Advisor. No abnormalities found.          Immunizations Immunization History  Administered Date(s) Administered  . Fluad Quad(high Dose 65+) 08/13/2019, 08/15/2020  . Influenza, High Dose Seasonal PF 09/29/2013, 09/29/2014, 09/15/2015  . Influenza-Unspecified 08/15/2017, 11/17/2018  . PFIZER(Purple Top)SARS-COV-2 Vaccination 12/06/2019, 12/27/2019  . Pneumococcal Conjugate-13 06/12/2016  . Pneumococcal Polysaccharide-23 10/10/2012, 02/11/2013    TDAP status: Due, Education has been provided regarding the importance of this vaccine. Advised may receive this vaccine at local pharmacy or Health Dept. Aware to provide a copy of the vaccination record if obtained from local pharmacy or Health Dept. Verbalized acceptance and understanding.  Flu Vaccine status: Up to date  Pneumococcal vaccine status: Up to date  Covid-19 vaccine status: Completed vaccines  Qualifies for Shingles Vaccine? Yes   Zostavax completed No   Shingrix Completed?: No.    Education has been provided regarding the importance of this vaccine. Patient has been advised to call insurance company to determine out of pocket expense if they have not yet received this vaccine. Advised may also receive vaccine at local pharmacy or Health Dept. Verbalized acceptance and understanding.  Screening Tests Health Maintenance  Topic Date Due  . TETANUS/TDAP  Never done  . Zoster Vaccines- Shingrix (1 of 2) Never done  . COVID-19 Vaccine (3 - Booster for Pfizer series) 05/25/2020  . INFLUENZA VACCINE  06/26/2021  . DEXA SCAN  Completed  . PNA vac Low Risk Adult  Completed  . Pneumococcal Vaccine 49-54 Years old  Aged Out   . HPV VACCINES  Aged Out    Health Maintenance  Health Maintenance Due  Topic Date Due  . TETANUS/TDAP  Never done  . Zoster Vaccines- Shingrix (1 of 2)  Never done  . COVID-19 Vaccine (3 - Booster for Pfizer series) 05/25/2020    Colorectal cancer screening: No longer required.   Mammogram status: Ordered today. Pt provided with contact info and advised to call to schedule appt.   Bone Density status: Completed 08/26/2019. Results reflect: Bone density results: OSTEOPENIA. Repeat every 2 years.  Lung Cancer Screening: (Low Dose CT Chest recommended if Age 15-80 years, 30 pack-year currently smoking OR have quit w/in 15years.) does not qualify.    Additional Screening:  Hepatitis C Screening: does not qualify  Vision Screening: Recommended annual ophthalmology exams for early detection of glaucoma and other disorders of the eye. Is the patient up to date with their annual eye exam?  Yes  Who is the provider or what is the name of the office in which the patient attends annual eye exams? Dr. Sherrine MaplesGlenn   Dental Screening: Recommended annual dental exams for proper oral hygiene  Community Resource Referral / Chronic Care Management: CRR required this visit?  No   CCM required this visit?  No      Plan:     I have personally reviewed and noted the following in the patient's chart:   . Medical and social history . Use of alcohol, tobacco or illicit drugs  . Current medications and supplements including opioid prescriptions.  . Functional ability and status . Nutritional status . Physical activity . Advanced directives . List of other physicians . Hospitalizations, surgeries, and ER visits in previous 12 months . Vitals . Screenings to include cognitive, depression, and falls . Referrals and appointments  In addition, I have reviewed and discussed with patient certain preventive protocols, quality metrics, and best practice recommendations. A written personalized care  plan for preventive services as well as general preventive health recommendations were provided to patient.   Due to this being a  visit, the virtual after visit summary with patients personalized plan was offered to patient via mail or my-chart.  Patient would like to access on my-chart   Roanna RaiderMartha A Finnleigh Marchetti, LPN   1/6/10966/06/2021  Nurse Health Advisor  Nurse Notes: None

## 2021-05-03 NOTE — Patient Instructions (Signed)
Faith Webb , Thank you for taking time to complete your Medicare Wellness Visit. I appreciate your ongoing commitment to your health goals. Please review the following plan we discussed and let me know if I can assist you in the future.   Screening recommendations/referrals: Colonoscopy: No longer required Mammogram: Ordered today. Someone will call you to schedule. Bone Density: Completed 08/26/2019-Due 9/302022 Recommended yearly ophthalmology/optometry visit for glaucoma screening and checkup Recommended yearly dental visit for hygiene and checkup  Vaccinations: Influenza vaccine: Up to date Pneumococcal vaccine: Up to date Tdap vaccine: Discuss with pharmacy Shingles vaccine: Discuss with pharmacy   Covid-19:Up to date  Advanced directives: Please bring a copy for your chart  Conditions/risks identified: See problem list  Next appointment: Follow up in one year for your annual wellness visit 05/07/2022 @ 11:40   Preventive Care 65 Years and Older, Female Preventive care refers to lifestyle choices and visits with your health care provider that can promote health and wellness. What does preventive care include?  A yearly physical exam. This is also called an annual well check.  Dental exams once or twice a year.  Routine eye exams. Ask your health care provider how often you should have your eyes checked.  Personal lifestyle choices, including:  Daily care of your teeth and gums.  Regular physical activity.  Eating a healthy diet.  Avoiding tobacco and drug use.  Limiting alcohol use.  Practicing safe sex.  Taking low-dose aspirin every day.  Taking vitamin and mineral supplements as recommended by your health care provider. What happens during an annual well check? The services and screenings done by your health care provider during your annual well check will depend on your age, overall health, lifestyle risk factors, and family history of disease. Counseling   Your health care provider may ask you questions about your:  Alcohol use.  Tobacco use.  Drug use.  Emotional well-being.  Home and relationship well-being.  Sexual activity.  Eating habits.  History of falls.  Memory and ability to understand (cognition).  Work and work Astronomer.  Reproductive health. Screening  You may have the following tests or measurements:  Height, weight, and BMI.  Blood pressure.  Lipid and cholesterol levels. These may be checked every 5 years, or more frequently if you are over 98 years old.  Skin check.  Lung cancer screening. You may have this screening every year starting at age 47 if you have a 30-pack-year history of smoking and currently smoke or have quit within the past 15 years.  Fecal occult blood test (FOBT) of the stool. You may have this test every year starting at age 58.  Flexible sigmoidoscopy or colonoscopy. You may have a sigmoidoscopy every 5 years or a colonoscopy every 10 years starting at age 53.  Hepatitis C blood test.  Hepatitis B blood test.  Sexually transmitted disease (STD) testing.  Diabetes screening. This is done by checking your blood sugar (glucose) after you have not eaten for a while (fasting). You may have this done every 1-3 years.  Bone density scan. This is done to screen for osteoporosis. You may have this done starting at age 37.  Mammogram. This may be done every 1-2 years. Talk to your health care provider about how often you should have regular mammograms. Talk with your health care provider about your test results, treatment options, and if necessary, the need for more tests. Vaccines  Your health care provider may recommend certain vaccines, such as:  Influenza  vaccine. This is recommended every year.  Tetanus, diphtheria, and acellular pertussis (Tdap, Td) vaccine. You may need a Td booster every 10 years.  Zoster vaccine. You may need this after age 53.  Pneumococcal 13-valent  conjugate (PCV13) vaccine. One dose is recommended after age 8.  Pneumococcal polysaccharide (PPSV23) vaccine. One dose is recommended after age 48. Talk to your health care provider about which screenings and vaccines you need and how often you need them. This information is not intended to replace advice given to you by your health care provider. Make sure you discuss any questions you have with your health care provider. Document Released: 12/09/2015 Document Revised: 08/01/2016 Document Reviewed: 09/13/2015 Elsevier Interactive Patient Education  2017 [place] Prevention in the Home Falls can cause injuries. They can happen to people of all ages. There are many things you can do to make your home safe and to help prevent falls. What can I do on the outside of my home?  Regularly fix the edges of walkways and driveways and fix any cracks.  Remove anything that might make you trip as you walk through a door, such as a raised step or threshold.  Trim any bushes or trees on the path to your home.  Use bright outdoor lighting.  Clear any walking paths of anything that might make someone trip, such as rocks or tools.  Regularly check to see if handrails are loose or broken. Make sure that both sides of any steps have handrails.  Any raised decks and porches should have guardrails on the edges.  Have any leaves, snow, or ice cleared regularly.  Use sand or salt on walking paths during winter.  Clean up any spills in your garage right away. This includes oil or grease spills. What can I do in the bathroom?  Use night lights.  Install grab bars by the toilet and in the tub and shower. Do not use towel bars as grab bars.  Use non-skid mats or decals in the tub or shower.  If you need to sit down in the shower, use a plastic, non-slip stool.  Keep the floor dry. Clean up any water that spills on the floor as soon as it happens.  Remove soap buildup in the tub or  shower regularly.  Attach bath mats securely with double-sided non-slip rug tape.  Do not have throw rugs and other things on the floor that can make you trip. What can I do in the bedroom?  Use night lights.  Make sure that you have a light by your bed that is easy to reach.  Do not use any sheets or blankets that are too big for your bed. They should not hang down onto the floor.  Have a firm chair that has side arms. You can use this for support while you get dressed.  Do not have throw rugs and other things on the floor that can make you trip. What can I do in the kitchen?  Clean up any spills right away.  Avoid walking on wet floors.  Keep items that you use a lot in easy-to-reach places.  If you need to reach something above you, use a strong step stool that has a grab bar.  Keep electrical cords out of the way.  Do not use floor polish or wax that makes floors slippery. If you must use wax, use non-skid floor wax.  Do not have throw rugs and other things on the floor that can  make you trip. What can I do with my stairs?  Do not leave any items on the stairs.  Make sure that there are handrails on both sides of the stairs and use them. Fix handrails that are broken or loose. Make sure that handrails are as long as the stairways.  Check any carpeting to make sure that it is firmly attached to the stairs. Fix any carpet that is loose or worn.  Avoid having throw rugs at the top or bottom of the stairs. If you do have throw rugs, attach them to the floor with carpet tape.  Make sure that you have a light switch at the top of the stairs and the bottom of the stairs. If you do not have them, ask someone to add them for you. What else can I do to help prevent falls?  Wear shoes that:  Do not have high heels.  Have rubber bottoms.  Are comfortable and fit you well.  Are closed at the toe. Do not wear sandals.  If you use a stepladder:  Make sure that it is fully  opened. Do not climb a closed stepladder.  Make sure that both sides of the stepladder are locked into place.  Ask someone to hold it for you, if possible.  Clearly mark and make sure that you can see:  Any grab bars or handrails.  First and last steps.  Where the edge of each step is.  Use tools that help you move around (mobility aids) if they are needed. These include:  Canes.  Walkers.  Scooters.  Crutches.  Turn on the lights when you go into a dark area. Replace any light bulbs as soon as they burn out.  Set up your furniture so you have a clear path. Avoid moving your furniture around.  If any of your floors are uneven, fix them.  If there are any pets around you, be aware of where they are.  Review your medicines with your doctor. Some medicines can make you feel dizzy. This can increase your chance of falling. Ask your doctor what other things that you can do to help prevent falls. This information is not intended to replace advice given to you by your health care provider. Make sure you discuss any questions you have with your health care provider. Document Released: 09/08/2009 Document Revised: 04/19/2016 Document Reviewed: 12/17/2014 Elsevier Interactive Patient Education  2017 [employer].

## 2021-05-09 ENCOUNTER — Encounter: Payer: Self-pay | Admitting: Family Medicine

## 2021-05-09 ENCOUNTER — Ambulatory Visit (HOSPITAL_BASED_OUTPATIENT_CLINIC_OR_DEPARTMENT_OTHER)
Admission: RE | Admit: 2021-05-09 | Discharge: 2021-05-09 | Disposition: A | Discharge status: Home / Self Care | Payer: Medicare PPO | Source: Ambulatory Visit | Attending: Family Medicine | Admitting: Family Medicine

## 2021-05-09 ENCOUNTER — Telehealth (INDEPENDENT_AMBULATORY_CARE_PROVIDER_SITE_OTHER): Payer: Medicare PPO | Admitting: Family Medicine

## 2021-05-09 ENCOUNTER — Other Ambulatory Visit: Payer: Self-pay

## 2021-05-09 DIAGNOSIS — G8929 Other chronic pain: Secondary | ICD-10-CM

## 2021-05-09 DIAGNOSIS — M25562 Pain in left knee: Secondary | ICD-10-CM | POA: Diagnosis not present

## 2021-05-09 DIAGNOSIS — M7050 Other bursitis of knee, unspecified knee: Secondary | ICD-10-CM | POA: Insufficient documentation

## 2021-05-09 NOTE — Progress Notes (Signed)
Subjective:   By signing my name below, I, Faith Webb, attest that this documentation has been prepared under the direction and in the presence of Dr. Seabron Spates, DO. 05/09/2021    Patient ID: Faith Webb, female    DOB: [birth year], 80 y.o.   MRN: [id number]  No chief complaint on file.   HPI Patient is in today for a office visit. She complains of L knee pain ----medial side   she is worried about blood clots    this has been going on for months-- no known injury.   Stairs are normally not difficult unless she has an "episode " where it locks on her   This "episodes" occur infrequently but the pain is always there.   Today the pain is at a 1/10 -- so the pain comes and goes .  Pt does not take anything for the pain.   No calf pain.   Pt states swelling comes and goes.    She is not exercising 3x a week and it bothers her then.     Past Medical History:  Diagnosis Date  . Allergy   . Arthritis   . Hyperlipidemia   . Hypertension     Past Surgical History:  Procedure Laterality Date  . ABDOMINAL HYSTERECTOMY    . BREAST BIOPSY Bilateral   . BREAST SURGERY    . EYE SURGERY      Family History  Problem Relation Age of Onset  . Diabetes Mother   . Heart disease Mother   . Hypertension Mother   . Stroke Mother   . Diabetes Father   . Cancer Sister   . Diabetes Sister   . Diabetes Brother   . Hyperlipidemia Brother   . Stroke Brother   . Diabetes Sister   . Hyperlipidemia Sister   . Hypertension Sister   . Kidney disease Sister   . Stroke Sister   . Diabetes Sister   . Hyperlipidemia Sister   . Healthy Child     Social History   Socioeconomic History  . Marital status: Widowed    Spouse name: Not on file  . Number of children: 2  . Years of education: Not on file  . Highest education level: Bachelor's degree (e.g., BA, AB, BS)  Occupational History  . Not on file  Tobacco Use  . Smoking status: Never  . Smokeless tobacco: Never  Vaping Use  .  Vaping Use: Never used  Substance and Sexual Activity  . Alcohol use: Never  . Drug use: Never  . Sexual activity: Not on file  Other Topics Concern  . Not on file  Social History Narrative   Pt lives alone in 1 story home, she has 2 children, right handed, she drinks coffee daily, tea/ soda occasionally.   Social Determinants of Health   Financial Resource Strain: Low Risk   . Difficulty of Paying Living Expenses: Not hard at all  Food Insecurity: No Food Insecurity  . Worried About Programme researcher, broadcasting/film/video in the Last Year: Never true  . Ran Out of Food in the Last Year: Never true  Transportation Needs: No Transportation Needs  . Lack of Transportation (Medical): No  . Lack of Transportation (Non-Medical): No  Physical Activity: Sufficiently Active  . Days of Exercise per Week: 3 days  . Minutes of Exercise per Session: 60 min  Stress: No Stress Concern Present  . Feeling of Stress : Not at all  Social Connections: Moderately Integrated  .  Frequency of Communication with Friends and Family: More than three times a week  . Frequency of Social Gatherings with Friends and Family: More than three times a week  . Attends Religious Services: More than 4 times per year  . Active Member of Clubs or Organizations: Yes  . Attends Banker Meetings: More than 4 times per year  . Marital Status: Widowed  Intimate Partner Violence: Not At Risk  . Fear of Current or Ex-Partner: No  . Emotionally Abused: No  . Physically Abused: No  . Sexually Abused: No    Outpatient Medications Prior to Visit  Medication Sig Dispense Refill  . hydrochlorothiazide (HYDRODIURIL) 25 MG tablet TAKE 1 TABLET(25 MG) BY MOUTH DAILY 90 tablet 1  . Magnesium Oxide 420 MG TABS Take 0.5 tablets by mouth daily.     . meclizine (ANTIVERT) 12.5 MG tablet Take 1 tablet (12.5 mg total) by mouth 3 (three) times daily as needed for dizziness. 30 tablet 2  . metoprolol tartrate (LOPRESSOR) 25 MG tablet Take 1  tablet (25 mg total) by mouth 2 (two) times daily. 60 tablet 2  . Omega-3 Fatty Acids (THE VERY FINEST FISH OIL) LIQD Take by mouth.    Marland Kitchen omeprazole (PRILOSEC) 20 MG capsule Take 20 mg by mouth daily as needed.    . potassium chloride (KLOR-CON) 10 MEQ tablet TAKE 3 TABLETS(30 MEQ) BY MOUTH TWICE DAILY 270 tablet 1  . rosuvastatin (CRESTOR) 10 MG tablet TAKE 1 TABLET(10 MG) BY MOUTH AT BEDTIME 90 tablet 1  . Vitamin D, Ergocalciferol, (DRISDOL) 1.25 MG (50000 UNIT) CAPS capsule TAKE 1 CAPSULE BY MOUTH EVERY WEEK 12 capsule 3   No facility-administered medications prior to visit.    Allergies  Allergen Reactions  . Amoxicillin Palpitations    Review of Systems  Constitutional:  Negative for chills, fever and malaise/fatigue.  HENT:  Negative for congestion and hearing loss.   Eyes:  Negative for discharge.  Respiratory:  Negative for cough, sputum production and shortness of breath.   Cardiovascular:  Negative for chest pain, palpitations and leg swelling.  Gastrointestinal:  Negative for abdominal pain, blood in stool, constipation, diarrhea, heartburn, nausea and vomiting.  Genitourinary:  Negative for dysuria, frequency, hematuria and urgency.  Musculoskeletal:  Positive for joint pain. Negative for back pain, falls and myalgias.  Skin:  Negative for rash.  Neurological:  Negative for dizziness, sensory change, loss of consciousness, weakness and headaches.  Endo/Heme/Allergies:  Negative for environmental allergies. Does not bruise/bleed easily.  Psychiatric/Behavioral:  Negative for depression and suicidal ideas. The patient is not nervous/anxious and does not have insomnia.       Objective:    Physical Exam Constitutional:      General: She is not in acute distress.    Appearance: Normal appearance. She is not ill-appearing.  HENT:     Head: Normocephalic and atraumatic.  Musculoskeletal:        General: Swelling and tenderness present. No signs of injury.     Right knee:  Normal.     Left knee: Swelling present. No erythema.       Legs:     Comments: 1/10 today L knee L knee looks swollen to pt No calf pain   Neurological:     Mental Status: She is alert and oriented to person, place, and time.  Psychiatric:        Mood and Affect: Mood normal.        Behavior: Behavior normal.  Thought Content: Thought content normal.        Judgment: Judgment normal.    There were no vitals taken for this visit. Wt Readings from Last 3 Encounters:  05/03/21 146 lb (66.2 kg)  12/27/20 146 lb 3.2 oz (66.3 kg)  08/15/20 148 lb 12.8 oz (67.5 kg)    Diabetic Foot Exam - Simple   No data filed    Lab Results  Component Value Date   WBC 3.9 (L) 12/27/2020   HGB 13.5 12/27/2020   HCT 40.6 12/27/2020   PLT 265.0 12/27/2020   GLUCOSE 72 12/27/2020   CHOL 137 12/27/2020   TRIG 97.0 12/27/2020   HDL 62.20 12/27/2020   LDLCALC 56 12/27/2020   ALT 17 12/27/2020   AST 13 12/27/2020   NA 140 12/27/2020   K 4.5 12/27/2020   CL 103 12/27/2020   CREATININE 1.03 12/27/2020   BUN 18 12/27/2020   CO2 32 12/27/2020   TSH 0.52 12/27/2020    Lab Results  Component Value Date   TSH 0.52 12/27/2020   Lab Results  Component Value Date   WBC 3.9 (L) 12/27/2020   HGB 13.5 12/27/2020   HCT 40.6 12/27/2020   MCV 89.5 12/27/2020   PLT 265.0 12/27/2020   Lab Results  Component Value Date   NA 140 12/27/2020   K 4.5 12/27/2020   CO2 32 12/27/2020   GLUCOSE 72 12/27/2020   BUN 18 12/27/2020   CREATININE 1.03 12/27/2020   BILITOT 0.6 12/27/2020   ALKPHOS 48 12/27/2020   AST 13 12/27/2020   ALT 17 12/27/2020   PROT 6.9 12/27/2020   ALBUMIN 4.3 12/27/2020   CALCIUM 9.9 12/27/2020   ANIONGAP 12 08/24/2018   GFR 51.56 (L) 12/27/2020   Lab Results  Component Value Date   CHOL 137 12/27/2020   Lab Results  Component Value Date   HDL 62.20 12/27/2020   Lab Results  Component Value Date   LDLCALC 56 12/27/2020   Lab Results  Component Value Date    TRIG 97.0 12/27/2020   Lab Results  Component Value Date   CHOLHDL 2 12/27/2020   No results found for: HGBA1C     Assessment & Plan:   Problem List Items Addressed This Visit   None    No orders of the defined types were placed in this encounter.   I, Dr. Seabron Spates, DO, personally preformed the services described in this documentation.  All medical record entries made by the scribe were at my direction and in my presence.  I have reviewed the chart and discharge instructions (if applicable) and agree that the record reflects my personal performance and is accurate and complete. 05/09/2021   I,Faith Webb,acting as a Neurosurgeon for [employer], DO.,have documented all relevant documentation on the behalf of [name], DO,as directed by  [name], DO while in the presence of [name], DO.   Faith [employer]

## 2021-05-09 NOTE — Assessment & Plan Note (Signed)
  Pain worsening  Refer to sport med Tylenol arthritis prn Xray ordered

## 2021-05-11 ENCOUNTER — Ambulatory Visit: Payer: Medicare PPO | Admitting: Family Medicine

## 2021-05-11 ENCOUNTER — Ambulatory Visit: Payer: Self-pay

## 2021-05-11 ENCOUNTER — Other Ambulatory Visit: Payer: Self-pay

## 2021-05-11 ENCOUNTER — Encounter: Payer: Self-pay | Admitting: Family Medicine

## 2021-05-11 VITALS — BP 122/76 | Ht 61.0 in | Wt 146.0 lb

## 2021-05-11 DIAGNOSIS — M7050 Other bursitis of knee, unspecified knee: Secondary | ICD-10-CM

## 2021-05-11 DIAGNOSIS — M25562 Pain in left knee: Secondary | ICD-10-CM

## 2021-05-11 NOTE — Patient Instructions (Signed)
Nice to meet you Please try ice as needed Please try the rub on medicine  Please try the exercises   Please send me a message in MyChart with any questions or updates.  Please see me back in 4 weeks.   --Dr. Jordan Likes

## 2021-05-11 NOTE — Assessment & Plan Note (Signed)
Has more pain at the pes anserine as opposed to the joint space. Does have degenerative changes. May have some instability of joint leading to the bursitis.  - counseled on home exercise therapy and supportive care - provided pennsaid samples  - could consider injection or PT.

## 2021-05-11 NOTE — Progress Notes (Signed)
Medication Samples have been provided to the patient.  Drug name: Pennsaid        Strength: 2%        Qty: 1 box LOT: [id number]  Exp.Date: 05/2022  Dosing instructions: Use a pea sized amount on affected are twice daily  The patient has been instructed regarding the correct time, dose, and frequency of taking this medication, including desired effects and most common side effects.   Lanier Prude 10:14 AM 05/11/2021

## 2021-05-11 NOTE — Progress Notes (Signed)
Faith Webb - 80 y.o. female MRN [id number]  Date of birth: 22-Jun-1941  SUBJECTIVE:  Including CC & ROS.  No chief complaint on file.   Faith Webb is a 80 y.o. female that is  presenting with left leg pain. Pain is intermittent in nature.  Having more pain at the proximal tibia as opposed to the joint space.  She is active and does several dance classes and works out..  Independent review left knee x-ray from 6/14 shows moderate degenerative change in the medial compartment.   Review of Systems See HPI   HISTORY: Past Medical, Surgical, Social, and Family History Reviewed & Updated per EMR.   Pertinent Historical Findings include:  Past Medical History:  Diagnosis Date   Allergy    Arthritis    Hyperlipidemia    Hypertension     Past Surgical History:  Procedure Laterality Date   ABDOMINAL HYSTERECTOMY     BREAST BIOPSY Bilateral    BREAST SURGERY     EYE SURGERY      Family History  Problem Relation Age of Onset   Diabetes Mother    Heart disease Mother    Hypertension Mother    Stroke Mother    Diabetes Father    Cancer Sister    Diabetes Sister    Diabetes Brother    Hyperlipidemia Brother    Stroke Brother    Diabetes Sister    Hyperlipidemia Sister    Hypertension Sister    Kidney disease Sister    Stroke Sister    Diabetes Sister    Hyperlipidemia Sister    Healthy Child     Social History   Socioeconomic History   Marital status: Widowed    Spouse name: Not on file   Number of children: 2   Years of education: Not on file   Highest education level: Bachelor's degree (e.g., BA, AB, BS)  Occupational History   Not on file  Tobacco Use   Smoking status: Never   Smokeless tobacco: Never  Vaping Use   Vaping Use: Never used  Substance and Sexual Activity   Alcohol use: Never   Drug use: Never   Sexual activity: Not on file  Other Topics Concern   Not on file  Social History Narrative   Pt lives alone in 1 story home, she has 2  children, right handed, she drinks coffee daily, tea/ soda occasionally.   Social Determinants of Health   Financial Resource Strain: Low Risk    Difficulty of Paying Living Expenses: Not hard at all  Food Insecurity: No Food Insecurity   Worried About Programme researcher, broadcasting/film/video in the Last Year: Never true   Ran Out of Food in the Last Year: Never true  Transportation Needs: No Transportation Needs   Lack of Transportation (Medical): No   Lack of Transportation (Non-Medical): No  Physical Activity: Sufficiently Active   Days of Exercise per Week: 3 days   Minutes of Exercise per Session: 60 min  Stress: No Stress Concern Present   Feeling of Stress : Not at all  Social Connections: Moderately Integrated   Frequency of Communication with Friends and Family: More than three times a week   Frequency of Social Gatherings with Friends and Family: More than three times a week   Attends Religious Services: More than 4 times per year   Active Member of [employer] or Organizations: Yes   Attends Banker Meetings: More than 4 times per year  Marital Status: Widowed  Catering manager Violence: Not At Risk   Fear of Current or Ex-Partner: No   Emotionally Abused: No   Physically Abused: No   Sexually Abused: No     PHYSICAL EXAM:  VS: BP 122/76 (BP Location: Left Arm, Patient Position: Sitting, Cuff Size: Normal)   Ht 5\' 1"  (1.549 m)   Wt 146 lb (66.2 kg)   BMI 27.59 kg/m  Physical Exam Gen: NAD, alert, cooperative with exam, well-appearing MSK:  Left knee: No obvious effusion. Normal range of motion. Nor tenderness palpation at the pes anserine. No instability. Neurovascular intact  Limited ultrasound: Left knee:  No effusion suprapatellar pouch. Normal-appearing quadriceps and patellar tendon. Moderate medial joint space narrowing with the medial meniscus changes. Hypoechoic change at the insertion of the Pes anserine to suggest bursitis  Summary: Pes anserine  bursitis  Ultrasound and interpretation by , MD    ASSESSMENT & PLAN:   Pes anserine bursitis Has more pain at the pes anserine as opposed to the joint space. Does have degenerative changes. May have some instability of joint leading to the bursitis.  - counseled on home exercise therapy and supportive care - provided pennsaid samples  - could consider injection or PT.

## 2021-05-16 ENCOUNTER — Encounter (HOSPITAL_BASED_OUTPATIENT_CLINIC_OR_DEPARTMENT_OTHER): Payer: Self-pay

## 2021-05-16 ENCOUNTER — Other Ambulatory Visit: Payer: Self-pay

## 2021-05-16 ENCOUNTER — Ambulatory Visit (HOSPITAL_BASED_OUTPATIENT_CLINIC_OR_DEPARTMENT_OTHER)
Admission: RE | Admit: 2021-05-16 | Discharge: 2021-05-16 | Disposition: A | Discharge status: Home / Self Care | Payer: Medicare PPO | Source: Ambulatory Visit | Attending: Family Medicine | Admitting: Family Medicine

## 2021-05-16 DIAGNOSIS — Z1231 Encounter for screening mammogram for malignant neoplasm of breast: Secondary | ICD-10-CM | POA: Diagnosis not present

## 2021-05-21 ENCOUNTER — Other Ambulatory Visit: Payer: Self-pay | Admitting: Family Medicine

## 2021-05-22 ENCOUNTER — Other Ambulatory Visit: Payer: Self-pay | Admitting: Family Medicine

## 2021-05-30 ENCOUNTER — Other Ambulatory Visit: Payer: Self-pay

## 2021-05-30 ENCOUNTER — Ambulatory Visit: Payer: Medicare PPO | Attending: Otolaryngology | Admitting: Physical Therapy

## 2021-05-30 DIAGNOSIS — R42 Dizziness and giddiness: Secondary | ICD-10-CM | POA: Insufficient documentation

## 2021-05-31 ENCOUNTER — Encounter: Payer: Self-pay | Admitting: Physical Therapy

## 2021-05-31 NOTE — Therapy (Signed)
[facility] [address] [place], [place], [address] Phone: [phone]   Fax:  [phone]  Physical Therapy Evaluation  Patient Details  Name: Faith Webb MRN: [id number] Date of Birth: 1941-07-18 Referring Provider (PT): Dr. Newman Pies   Encounter Date: 05/30/2021   PT End of Session - 05/31/21 1841     Visit Number 1    Number of Visits 1    Authorization Type [employer]    PT Start Time 1017    PT Stop Time 1100    PT Time Calculation (min) 43 min    Activity Tolerance Patient tolerated treatment well    Behavior During Therapy [facility] for tasks assessed/performed             Past Medical History:  Diagnosis Date   Allergy    Arthritis    Hyperlipidemia    Hypertension     Past Surgical History:  Procedure Laterality Date   ABDOMINAL HYSTERECTOMY     BREAST BIOPSY Bilateral    BREAST SURGERY     EYE SURGERY      There were no vitals filed for this visit.        [facility] PT Assessment - 05/31/21 0001       Assessment   Medical Diagnosis Vertigo    Referring Provider (PT) Dr. Newman Pies    Onset Date/Surgical Date --   approx. April 2020   Prior Therapy none      Precautions   Precautions None      Balance Screen   Has the patient fallen in the past 6 months No    Has the patient had a decrease in activity level because of a fear of falling?  No    Is the patient reluctant to leave their home because of a fear of falling?  No      Home Environment   Living Environment Private residence    Type of Home House      Prior Function   Level of Independence Independent    Leisure pt enjoys line dancing      Observation/Other Assessments   Focus on Therapeutic Outcomes (FOTO)  DFS 61%                    Vestibular Assessment - 05/31/21 0001       Symptom Behavior   Type of Dizziness  Blurred vision;Unsteady with head/body turns;Lightheadedness    Frequency of Dizziness  usually daily but varies in intensity    Duration of Dizziness varies - minutes to several minutes    Aggravating Factors Turning head quickly    Relieving Factors Head stationary    Progression of Symptoms Better    History of similar episodes initial onset April 2020      Oculomotor Exam   Spontaneous Absent    Smooth Pursuits Intact    Saccades Intact    Comment pt reports visual disturbance with cervical rotation - to Rt side > Lt side; did not occur during eval today      Visual Acuity   Static line 8    Dynamic line 7                Objective measurements completed on examination: See above findings.                            Plan - 05/31/21 1841  Clinical Impression Statement Pt reports no dizziness at this time which appears to be related to vestibular dysfunction.  Pt does report visual disturbance at times with cervical rotation and states that she feels the dizziness is related to visual problems - pt reports blurred vision at times and possible floaters which occur and in turn cause her to feel dizzy.  Pt reports that this feeling is not impacting her function, but is aggravating when it does occur.  Pt and daugther were recommended to follow up and inform opthalmologist of this visual disturbance.  DVA is WNL's with a 1 line difference with no c/o dizziness.  Eval only at this time.    Stability/Clinical Decision Making Stable/Uncomplicated    Clinical Decision Making Low    PT Frequency One time visit    PT Treatment/Interventions Other (comment)   Evaluation only            Patient will benefit from skilled therapeutic intervention in order to improve the following deficits and impairments:  Dizziness  Visit Diagnosis: Dizziness and giddiness - Plan: PT plan of care cert/re-cert     Problem List Patient Active Problem List   Diagnosis Date Noted   Pes anserine bursitis 05/11/2021   Pes anserine bursitis 05/09/2021    Urinary frequency 12/27/2020   Hypomagnesemia 12/27/2020   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 [place], [place], [address] Phone: [phone]   Fax:  [phone]  Name: Faith Webb MRN: [id number] Date of Birth: 1941/03/31

## 2021-06-07 ENCOUNTER — Other Ambulatory Visit: Payer: Self-pay | Admitting: Family Medicine

## 2021-06-08 ENCOUNTER — Ambulatory Visit: Payer: Medicare PPO | Attending: Internal Medicine

## 2021-06-08 ENCOUNTER — Other Ambulatory Visit: Payer: Self-pay

## 2021-06-08 ENCOUNTER — Encounter: Payer: Self-pay | Admitting: Family Medicine

## 2021-06-08 ENCOUNTER — Ambulatory Visit: Payer: Medicare PPO | Admitting: Family Medicine

## 2021-06-08 DIAGNOSIS — M7050 Other bursitis of knee, unspecified knee: Secondary | ICD-10-CM | POA: Diagnosis not present

## 2021-06-08 DIAGNOSIS — Z23 Encounter for immunization: Secondary | ICD-10-CM

## 2021-06-08 NOTE — Progress Notes (Signed)
Faith Webb - 80 y.o. female MRN [id number]  Date of birth: 1941-07-12  SUBJECTIVE:  Including CC & ROS.  No chief complaint on file.   Faith Webb is a 80 y.o. female that is following up for her bursitis of her left knee.  Has gotten improvement with the Pennsaid.  She exercises 3 days/week.  Having s mild pain intermittently.    Review of Systems See HPI   HISTORY: Past Medical, Surgical, Social, and Family History Reviewed & Updated per EMR.   Pertinent Historical Findings include:  Past Medical History:  Diagnosis Date   Allergy    Arthritis    Hyperlipidemia    Hypertension     Past Surgical History:  Procedure Laterality Date   ABDOMINAL HYSTERECTOMY     BREAST BIOPSY Bilateral    BREAST SURGERY     EYE SURGERY      Family History  Problem Relation Age of Onset   Diabetes Mother    Heart disease Mother    Hypertension Mother    Stroke Mother    Diabetes Father    Cancer Sister    Diabetes Sister    Diabetes Brother    Hyperlipidemia Brother    Stroke Brother    Diabetes Sister    Hyperlipidemia Sister    Hypertension Sister    Kidney disease Sister    Stroke Sister    Diabetes Sister    Hyperlipidemia Sister    Healthy Child     Social History   Socioeconomic History   Marital status: Widowed    Spouse name: Not on file   Number of children: 2   Years of education: Not on file   Highest education level: Bachelor's degree (e.g., BA, AB, BS)  Occupational History   Not on file  Tobacco Use   Smoking status: Never   Smokeless tobacco: Never  Vaping Use   Vaping Use: Never used  Substance and Sexual Activity   Alcohol use: Never   Drug use: Never   Sexual activity: Not on file  Other Topics Concern   Not on file  Social History Narrative   Pt lives alone in 1 story home, she has 2 children, right handed, she drinks coffee daily, tea/ soda occasionally.   Social Determinants of Health   Financial Resource Strain: Low Risk     Difficulty of Paying Living Expenses: Not hard at all  Food Insecurity: No Food Insecurity   Worried About Programme researcher, broadcasting/film/video in the Last Year: Never true   Ran Out of Food in the Last Year: Never true  Transportation Needs: No Transportation Needs   Lack of Transportation (Medical): No   Lack of Transportation (Non-Medical): No  Physical Activity: Sufficiently Active   Days of Exercise per Week: 3 days   Minutes of Exercise per Session: 60 min  Stress: No Stress Concern Present   Feeling of Stress : Not at all  Social Connections: Moderately Integrated   Frequency of Communication with Friends and Family: More than three times a week   Frequency of Social Gatherings with Friends and Family: More than three times a week   Attends Religious Services: More than 4 times per year   Active Member of [employer] or Organizations: Yes   Attends Banker Meetings: More than 4 times per year   Marital Status: Widowed  Intimate Partner Violence: Not At Risk   Fear of Current or Ex-Partner: No   Emotionally Abused: No  Physically Abused: No   Sexually Abused: No     PHYSICAL EXAM:  VS: BP 122/80 (BP Location: Left Arm, Patient Position: Sitting, Cuff Size: Large)   Ht 5\' 1"  (1.549 m)   Wt 146 lb (66.2 kg)   BMI 27.59 kg/m  Physical Exam Gen: NAD, alert, cooperative with exam, well-appearing    ASSESSMENT & PLAN:   Pes anserine bursitis Has had improvement of the pain.  She continues to be active.  Pain is only mild currently. -Counseled on home exercise therapy and supportive care. -Pennsaid samples. -Could consider physical therapy.

## 2021-06-08 NOTE — Assessment & Plan Note (Signed)
Has had improvement of the pain.  She continues to be active.  Pain is only mild currently. -Counseled on home exercise therapy and supportive care. -Pennsaid samples. -Could consider physical therapy.

## 2021-06-08 NOTE — Progress Notes (Signed)
Medication Samples have been provided to the patient.  Drug name: Pennsaid       Strength: 2%        Qty: 2 boxes LOT: [id number]  Exp.Date: 05/2022  Dosing instructions: use a pea sized amount   The patient has been instructed regarding the correct time, dose, and frequency of taking this medication, including desired effects and most common side effects.   Lanier Prude 9:47 AM 06/08/2021

## 2021-06-08 NOTE — Patient Instructions (Signed)
Good to see you Please use ice as needed  Please try the pennsaid or voltaren  Please try the exercises  Please let us know if you want to try physical therapy   Please send me a message in MyChart with any questions or updates.  Please see me back in 6-8 weeks.   --Dr. Jordan Likes

## 2021-06-08 NOTE — Progress Notes (Signed)
   Covid-19 Vaccination Clinic  Name:  Faith Webb    MRN: [id number] DOB: [birth year]  06/08/2021  [name] was observed post Covid-19 immunization for 15 minutes without incident. She was provided with Vaccine Information Sheet and instruction to access the V-Safe system.   [name] was instructed to call 911 with any severe reactions post vaccine: Difficulty breathing  Swelling of face and throat  A fast heartbeat  A bad rash all over body  Dizziness and weakness   Immunizations Administered     Name Date Dose VIS Date Route   PFIZER Comrnaty(Gray TOP) Covid-19 Vaccine 06/08/2021  9:54 AM 0.3 mL 11/03/2020 Intramuscular   Manufacturer: [employer], [employer]   Lot: [id number]   NDC: [phone]

## 2021-06-13 ENCOUNTER — Other Ambulatory Visit (HOSPITAL_BASED_OUTPATIENT_CLINIC_OR_DEPARTMENT_OTHER): Payer: Self-pay

## 2021-06-13 MED ORDER — COVID-19 MRNA VAC-TRIS(PFIZER) 30 MCG/0.3ML IM SUSP
INTRAMUSCULAR | 0 refills | Status: DC
  Filled 2021-06-13: qty 0.3, 1d supply, fill #0

## 2021-06-27 ENCOUNTER — Encounter: Payer: Self-pay | Admitting: Family Medicine

## 2021-06-27 ENCOUNTER — Ambulatory Visit: Payer: Medicare PPO | Admitting: Family Medicine

## 2021-06-27 ENCOUNTER — Other Ambulatory Visit: Payer: Self-pay

## 2021-06-27 VITALS — BP 118/70 | HR 70 | Temp 98.1°F | Resp 18 | Ht 61.0 in | Wt 142.4 lb

## 2021-06-27 DIAGNOSIS — I10 Essential (primary) hypertension: Secondary | ICD-10-CM

## 2021-06-27 DIAGNOSIS — E785 Hyperlipidemia, unspecified: Secondary | ICD-10-CM | POA: Diagnosis not present

## 2021-06-27 DIAGNOSIS — R790 Abnormal level of blood mineral: Secondary | ICD-10-CM

## 2021-06-27 DIAGNOSIS — R059 Cough, unspecified: Secondary | ICD-10-CM

## 2021-06-27 DIAGNOSIS — E559 Vitamin D deficiency, unspecified: Secondary | ICD-10-CM | POA: Diagnosis not present

## 2021-06-27 LAB — COMPREHENSIVE METABOLIC PANEL
ALT: 14 U/L (ref 0–35)
AST: 12 U/L (ref 0–37)
Albumin: 4.2 g/dL (ref 3.5–5.2)
Alkaline Phosphatase: 47 U/L (ref 39–117)
BUN: 15 mg/dL (ref 6–23)
CO2: 30 mEq/L (ref 19–32)
Calcium: 9.5 mg/dL (ref 8.4–10.5)
Chloride: 105 mEq/L (ref 96–112)
Creatinine, Ser: 1.21 mg/dL — ABNORMAL HIGH (ref 0.40–1.20)
GFR: 42.35 mL/min — ABNORMAL LOW (ref >60.00)
Glucose, Bld: 77 mg/dL (ref 70–99)
Potassium: 4 mEq/L (ref 3.5–5.1)
Sodium: 143 mEq/L (ref 135–145)
Total Bilirubin: 0.6 mg/dL (ref 0.2–1.2)
Total Protein: 6.7 g/dL (ref 6.0–8.3)

## 2021-06-27 LAB — MAGNESIUM: Magnesium: 1.8 mg/dL (ref 1.5–2.5)

## 2021-06-27 LAB — LIPID PANEL
Cholesterol: 122 mg/dL (ref 0–200)
HDL: 54.3 mg/dL (ref >39.00)
LDL Cholesterol: 44 mg/dL (ref 0–99)
NonHDL: 67.77
Total CHOL/HDL Ratio: 2
Triglycerides: 119 mg/dL (ref 0.0–149.0)
VLDL: 23.8 mg/dL (ref 0.0–40.0)

## 2021-06-27 LAB — VITAMIN D 25 HYDROXY (VIT D DEFICIENCY, FRACTURES): VITD: 40.37 ng/mL (ref 30.00–100.00)

## 2021-06-27 MED ORDER — PROMETHAZINE-DM 6.25-15 MG/5ML PO SYRP: 5.0000 mL | ORAL_SOLUTION | Freq: Four times a day (QID) | ORAL | 0 refills | Status: DC | PRN

## 2021-06-27 NOTE — Assessment & Plan Note (Signed)
Well controlled, no changes to meds. Encouraged heart healthy diet such as the DASH diet and exercise as tolerated.  °

## 2021-06-27 NOTE — Assessment & Plan Note (Signed)
Check labs today.

## 2021-06-27 NOTE — Progress Notes (Signed)
Subjective:   By signing my name below, I, [name], attest that this documentation has been prepared under the direction and in the presence of Dr. Seabron Spates, DO. 06/27/2021    Patient ID: Faith Webb, female    DOB: 1941-03-05, 80 y.o.   MRN: [id number]  Chief Complaint  Patient presents with   Hyperlipidemia    Pt wants to know if she should continue taking magnesuim 3 times a day and if the OTC Vit D is enough   Follow-up    HPI Patient is in today for office visit. She has stopped taking 12.5 mg meclizine daily PO. She has not taken 20 mg omeprazole daily PRN in a while.  Her blood pressure is doing well during this visit. She continues taking 25 mg metoprolol tartrate 2x daily PO, 25 mg hydrochlorothiazide daily PO and reports no new issues while taking them. She reports she has not seen her cardiologist in a while.  BP Readings from Last 3 Encounters:  06/27/21 118/70  06/08/21 122/80  05/11/21 122/76   She is inquiring if she can stop taking magnesium supplements. She has 3 pfizer Covid-19 vaccines and is willing to get the 2nd booster vaccine at a later date. She is due for the shingles vaccines.  Past Medical History:  Diagnosis Date   Allergy    Arthritis    Hyperlipidemia    Hypertension     Past Surgical History:  Procedure Laterality Date   ABDOMINAL HYSTERECTOMY     BREAST BIOPSY Bilateral    BREAST SURGERY     EYE SURGERY      Family History  Problem Relation Age of Onset   Diabetes Mother    Heart disease Mother    Hypertension Mother    Stroke Mother    Diabetes Father    Cancer Sister    Diabetes Sister    Diabetes Brother    Hyperlipidemia Brother    Stroke Brother    Diabetes Sister    Hyperlipidemia Sister    Hypertension Sister    Kidney disease Sister    Stroke Sister    Diabetes Sister    Hyperlipidemia Sister    Healthy Child     Social History   Socioeconomic History   Marital status: Widowed    Spouse  name: Not on file   Number of children: 2   Years of education: Not on file   Highest education level: Bachelor's degree (e.g., BA, AB, BS)  Occupational History   Not on file  Tobacco Use   Smoking status: Never   Smokeless tobacco: Never  Vaping Use   Vaping Use: Never used  Substance and Sexual Activity   Alcohol use: Never   Drug use: Never   Sexual activity: Not on file  Other Topics Concern   Not on file  Social History Narrative   Pt lives alone in 1 story home, she has 2 children, right handed, she drinks coffee daily, tea/ soda occasionally.   Social Determinants of Health   Financial Resource Strain: Low Risk    Difficulty of Paying Living Expenses: Not hard at all  Food Insecurity: No Food Insecurity   Worried About Programme researcher, broadcasting/film/video in the Last Year: Never true   Ran Out of Food in the Last Year: Never true  Transportation Needs: No Transportation Needs   Lack of Transportation (Medical): No   Lack of Transportation (Non-Medical): No  Physical Activity: Sufficiently Active   Days  of Exercise per Week: 3 days   Minutes of Exercise per Session: 60 min  Stress: No Stress Concern Present   Feeling of Stress : Not at all  Social Connections: Moderately Integrated   Frequency of Communication with Friends and Family: More than three times a week   Frequency of Social Gatherings with Friends and Family: More than three times a week   Attends Religious Services: More than 4 times per year   Active Member of [employer] or Organizations: Yes   Attends [employer] or Organization Meetings: More than 4 times per year   Marital Status: Widowed  Catering managerntimate Partner Violence: Not At Risk   Fear of Current or Ex-Partner: No   Emotionally Abused: No   Physically Abused: No   Sexually Abused: No    Outpatient Medications Prior to Visit  Medication Sig Dispense Refill   hydrochlorothiazide (HYDRODIURIL) 25 MG tablet TAKE 1 TABLET(25 MG) BY MOUTH DAILY 90 tablet 1   Magnesium Oxide 420  MG TABS Take 0.5 tablets by mouth daily.      metoprolol tartrate (LOPRESSOR) 25 MG tablet TAKE 1 TABLET(25 MG) BY MOUTH TWICE DAILY 60 tablet 2   Omega-3 Fatty Acids (THE VERY FINEST FISH OIL) LIQD Take by mouth.     omeprazole (PRILOSEC) 20 MG capsule Take 20 mg by mouth daily as needed.     potassium chloride (KLOR-CON) 10 MEQ tablet TAKE 3 TABLETS(30 MEQ) BY MOUTH TWICE DAILY 270 tablet 1   rosuvastatin (CRESTOR) 10 MG tablet TAKE 1 TABLET(10 MG) BY MOUTH AT BEDTIME 90 tablet 1   Vitamin D, Ergocalciferol, (DRISDOL) 1.25 MG (50000 UNIT) CAPS capsule TAKE 1 CAPSULE BY MOUTH EVERY WEEK (Patient not taking: Reported on 06/27/2021) 12 capsule 3   COVID-19 mRNA Vac-TriS, Pfizer, SUSP injection Inject into the muscle. (Patient not taking: Reported on 06/27/2021) 0.3 mL 0   meclizine (ANTIVERT) 12.5 MG tablet Take 1 tablet (12.5 mg total) by mouth 3 (three) times daily as needed for dizziness. (Patient not taking: Reported on 06/27/2021) 30 tablet 2   No facility-administered medications prior to visit.    Allergies  Allergen Reactions   Amoxicillin Palpitations    Review of Systems  Constitutional:  Negative for fever and malaise/fatigue.  HENT:  Negative for congestion.   Eyes:  Negative for blurred vision.  Respiratory:  Negative for shortness of breath.   Cardiovascular:  Negative for chest pain, palpitations and leg swelling.  Gastrointestinal:  Negative for abdominal pain, blood in stool and nausea.  Genitourinary:  Negative for dysuria and frequency.  Musculoskeletal:  Negative for falls.  Skin:  Negative for rash.  Neurological:  Negative for dizziness, loss of consciousness and headaches.  Endo/Heme/Allergies:  Negative for environmental allergies.  Psychiatric/Behavioral:  Negative for depression. The patient is not nervous/anxious.       Objective:    Physical Exam Vitals and nursing note reviewed.  Constitutional:      General: She is not in acute distress.    Appearance:  Normal appearance. She is well-developed. She is not ill-appearing.  HENT:     Head: Normocephalic and atraumatic.     Right Ear: External ear normal.     Left Ear: External ear normal.  Eyes:     Extraocular Movements: Extraocular movements intact.     Conjunctiva/sclera: Conjunctivae normal.     Pupils: Pupils are equal, round, and reactive to light.  Neck:     Thyroid: No thyromegaly.     Vascular: No carotid bruit  or JVD.  Cardiovascular:     Rate and Rhythm: Normal rate and regular rhythm.     Heart sounds: Normal heart sounds. No murmur heard.   No gallop.  Pulmonary:     Effort: Pulmonary effort is normal. No respiratory distress.     Breath sounds: Normal breath sounds. No wheezing or rales.  Chest:     Chest wall: No tenderness.  Musculoskeletal:     Cervical back: Normal range of motion and neck supple.  Skin:    General: Skin is warm and dry.  Neurological:     Mental Status: She is alert and oriented to person, place, and time.  Psychiatric:        Behavior: Behavior normal.        Judgment: Judgment normal.    BP 118/70 (BP Location: Right Arm, Patient Position: Sitting, Cuff Size: Normal)   Pulse 70   Temp 98.1 F (36.7 C) (Oral)   Resp 18   Ht 5\' 1"  (1.549 m)   Wt 142 lb 6.4 oz (64.6 kg)   SpO2 98%   BMI 26.91 kg/m  Wt Readings from Last 3 Encounters:  06/27/21 142 lb 6.4 oz (64.6 kg)  06/08/21 146 lb (66.2 kg)  05/11/21 146 lb (66.2 kg)    Diabetic Foot Exam - Simple   No data filed    Lab Results  Component Value Date   WBC 3.9 (L) 12/27/2020   HGB 13.5 12/27/2020   HCT 40.6 12/27/2020   PLT 265.0 12/27/2020   GLUCOSE 72 12/27/2020   CHOL 137 12/27/2020   TRIG 97.0 12/27/2020   HDL 62.20 12/27/2020   LDLCALC 56 12/27/2020   ALT 17 12/27/2020   AST 13 12/27/2020   NA 140 12/27/2020   K 4.5 12/27/2020   CL 103 12/27/2020   CREATININE 1.03 12/27/2020   BUN 18 12/27/2020   CO2 32 12/27/2020   TSH 0.52 12/27/2020    Lab Results   Component Value Date   TSH 0.52 12/27/2020   Lab Results  Component Value Date   WBC 3.9 (L) 12/27/2020   HGB 13.5 12/27/2020   HCT 40.6 12/27/2020   MCV 89.5 12/27/2020   PLT 265.0 12/27/2020   Lab Results  Component Value Date   NA 140 12/27/2020   K 4.5 12/27/2020   CO2 32 12/27/2020   GLUCOSE 72 12/27/2020   BUN 18 12/27/2020   CREATININE 1.03 12/27/2020   BILITOT 0.6 12/27/2020   ALKPHOS 48 12/27/2020   AST 13 12/27/2020   ALT 17 12/27/2020   PROT 6.9 12/27/2020   ALBUMIN 4.3 12/27/2020   CALCIUM 9.9 12/27/2020   ANIONGAP 12 08/24/2018   GFR 51.56 (L) 12/27/2020   Lab Results  Component Value Date   CHOL 137 12/27/2020   Lab Results  Component Value Date   HDL 62.20 12/27/2020   Lab Results  Component Value Date   LDLCALC 56 12/27/2020   Lab Results  Component Value Date   TRIG 97.0 12/27/2020   Lab Results  Component Value Date   CHOLHDL 2 12/27/2020   No results found for: HGBA1C     Assessment & Plan:   Problem List Items Addressed This Visit       Unprioritized   Hyperlipidemia - Primary    Encourage heart healthy diet such as MIND or DASH diet, increase exercise, avoid trans fats, simple carbohydrates and processed foods, consider a krill or fish or flaxseed oil cap daily.  Relevant Orders   Lipid panel   Comprehensive metabolic panel   Hypertension    Well controlled, no changes to meds. Encouraged heart healthy diet such as the DASH diet and exercise as tolerated.        Relevant Orders   Lipid panel   Comprehensive metabolic panel   Hypomagnesemia    Check labs today       Other Visit Diagnoses     Cough       Relevant Medications   promethazine-dextromethorphan (PROMETHAZINE-DM) 6.25-15 MG/5ML syrup   Low magnesium level       Relevant Orders   Magnesium   Vitamin D deficiency       Relevant Orders   VITAMIN D 25 Hydroxy (Vit-D Deficiency, Fractures)        Meds ordered this encounter  Medications    promethazine-dextromethorphan (PROMETHAZINE-DM) 6.25-15 MG/5ML syrup    Sig: Take 5 mLs by mouth 4 (four) times daily as needed.    Dispense:  118 mL    Refill:  0    I, Dr. Seabron Spates, DO, personally preformed the services described in this documentation.  All medical record entries made by the scribe were at my direction and in my presence.  I have reviewed the chart and discharge instructions (if applicable) and agree that the record reflects my personal performance and is accurate and complete. 06/27/2021   I,[name],acting as a scribe for [name], DO.,have documented all relevant documentation on the behalf of [name], DO,as directed by  [name], DO while in the presence of [name], DO.   [name], DO

## 2021-06-27 NOTE — Patient Instructions (Signed)

## 2021-06-27 NOTE — Assessment & Plan Note (Signed)
Encourage heart healthy diet such as MIND or DASH diet, increase exercise, avoid trans fats, simple carbohydrates and processed foods, consider a krill or fish or flaxseed oil cap daily.  °

## 2021-07-22 DIAGNOSIS — K219 Gastro-esophageal reflux disease without esophagitis: Secondary | ICD-10-CM | POA: Diagnosis not present

## 2021-07-22 DIAGNOSIS — M1990 Unspecified osteoarthritis, unspecified site: Secondary | ICD-10-CM | POA: Diagnosis not present

## 2021-07-22 DIAGNOSIS — I10 Essential (primary) hypertension: Secondary | ICD-10-CM | POA: Diagnosis not present

## 2021-07-22 DIAGNOSIS — Z823 Family history of stroke: Secondary | ICD-10-CM | POA: Diagnosis not present

## 2021-07-22 DIAGNOSIS — R42 Dizziness and giddiness: Secondary | ICD-10-CM | POA: Diagnosis not present

## 2021-07-22 DIAGNOSIS — E785 Hyperlipidemia, unspecified: Secondary | ICD-10-CM | POA: Diagnosis not present

## 2021-07-22 DIAGNOSIS — Z833 Family history of diabetes mellitus: Secondary | ICD-10-CM | POA: Diagnosis not present

## 2021-07-22 DIAGNOSIS — Z803 Family history of malignant neoplasm of breast: Secondary | ICD-10-CM | POA: Diagnosis not present

## 2021-07-22 DIAGNOSIS — Z8249 Family history of ischemic heart disease and other diseases of the circulatory system: Secondary | ICD-10-CM | POA: Diagnosis not present

## 2021-07-29 ENCOUNTER — Other Ambulatory Visit: Payer: Self-pay | Admitting: Family Medicine

## 2021-07-29 DIAGNOSIS — E785 Hyperlipidemia, unspecified: Secondary | ICD-10-CM

## 2021-07-29 DIAGNOSIS — I10 Essential (primary) hypertension: Secondary | ICD-10-CM

## 2021-08-09 ENCOUNTER — Other Ambulatory Visit: Payer: Self-pay

## 2021-08-09 ENCOUNTER — Emergency Department (HOSPITAL_BASED_OUTPATIENT_CLINIC_OR_DEPARTMENT_OTHER)
Admission: EM | Admit: 2021-08-09 | Discharge: 2021-08-09 | Disposition: A | Discharge status: Home / Self Care | Payer: Medicare PPO | Attending: Emergency Medicine | Admitting: Emergency Medicine

## 2021-08-09 ENCOUNTER — Encounter (HOSPITAL_BASED_OUTPATIENT_CLINIC_OR_DEPARTMENT_OTHER): Payer: Self-pay | Admitting: Emergency Medicine

## 2021-08-09 ENCOUNTER — Emergency Department (HOSPITAL_BASED_OUTPATIENT_CLINIC_OR_DEPARTMENT_OTHER): Payer: Medicare PPO

## 2021-08-09 DIAGNOSIS — M19012 Primary osteoarthritis, left shoulder: Secondary | ICD-10-CM | POA: Diagnosis not present

## 2021-08-09 DIAGNOSIS — Z79899 Other long term (current) drug therapy: Secondary | ICD-10-CM | POA: Insufficient documentation

## 2021-08-09 DIAGNOSIS — M25512 Pain in left shoulder: Secondary | ICD-10-CM | POA: Insufficient documentation

## 2021-08-09 DIAGNOSIS — I10 Essential (primary) hypertension: Secondary | ICD-10-CM | POA: Diagnosis not present

## 2021-08-09 LAB — CBC
HCT: 40.9 % (ref 36.0–46.0)
Hemoglobin: 13.8 g/dL (ref 12.0–15.0)
MCH: 30.1 pg (ref 26.0–34.0)
MCHC: 33.7 g/dL (ref 30.0–36.0)
MCV: 89.3 fL (ref 80.0–100.0)
Platelets: 258 10*3/uL (ref 150–400)
RBC: 4.58 MIL/uL (ref 3.87–5.11)
RDW: 13.3 % (ref 11.5–15.5)
WBC: 4.2 10*3/uL (ref 4.0–10.5)
nRBC: 0 % (ref 0.0–0.2)

## 2021-08-09 LAB — BASIC METABOLIC PANEL
Anion gap: 8 (ref 5–15)
BUN: 17 mg/dL (ref 8–23)
CO2: 28 mmol/L (ref 22–32)
Calcium: 9.3 mg/dL (ref 8.9–10.3)
Chloride: 103 mmol/L (ref 98–111)
Creatinine, Ser: 0.96 mg/dL (ref 0.44–1.00)
GFR, Estimated: 60 mL/min — ABNORMAL LOW (ref >60)
Glucose, Bld: 84 mg/dL (ref 70–99)
Potassium: 4 mmol/L (ref 3.5–5.1)
Sodium: 139 mmol/L (ref 135–145)

## 2021-08-09 LAB — TROPONIN I (HIGH SENSITIVITY): Troponin I (High Sensitivity): <2 ng/L (ref <18)

## 2021-08-09 MED ORDER — ACETAMINOPHEN 325 MG PO TABS
650.0000 mg | ORAL_TABLET | Freq: Once | ORAL | Status: AC
  Administered 2021-08-09: 650 mg via ORAL
  Filled 2021-08-09: qty 2

## 2021-08-09 NOTE — ED Provider Notes (Signed)
[facility] EMERGENCY DEPARTMENT Provider Note   CSN: [id number] Arrival date & time: 08/09/21  1122     History Chief Complaint  Patient presents with   Shoulder Pain    Faith Webb is a 80 y.o. female.   Shoulder Pain Associated symptoms: no back pain, no fatigue, no fever and no neck pain   Patient presents for left shoulder pain for the past 3 to 4 days.  Onset was gradual.  There were no precipitating injuries or straining activities.  She does think she may have slept on it in an awkward position but is not sure.  Pain is maximal in the anterior aspect of her left shoulder.  It is worsened with shoulder range of motion and palpation.  She presents to the ED due to concerns that she may have a cerebral cause.  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  She denies any sensations of numbness or weakness.  She denies any other areas of pain.    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     Family History  Problem Relation Age of Onset   Diabetes Mother    Heart disease Mother    Hypertension Mother    Stroke Mother    Diabetes Father    Cancer Sister    Diabetes Sister    Diabetes Brother    Hyperlipidemia Brother    Stroke Brother    Diabetes Sister    Hyperlipidemia Sister    Hypertension Sister     Kidney disease Sister    Stroke Sister    Diabetes Sister    Hyperlipidemia Sister    Healthy Child     Social History   Tobacco Use   Smoking status: Never   Smokeless tobacco: Never  Vaping Use   Vaping Use: Never used  Substance Use Topics   Alcohol use: Never   Drug use: Never    Home Medications Prior to Admission medications   Medication Sig Start Date End Date Taking? Authorizing Provider  hydrochlorothiazide (HYDRODIURIL) 25 MG tablet TAKE 1 TABLET(25 MG) BY MOUTH DAILY 08/01/21   Bradd Canary, MD  Magnesium Oxide 420 MG TABS Take 0.5 tablets by mouth daily.     [provider]  metoprolol tartrate (LOPRESSOR) 25 MG tablet TAKE 1 TABLET(25 MG) BY MOUTH TWICE DAILY 05/22/21   Zola Button, Grayling Congress, DO  Omega-3 Fatty Acids (THE VERY FINEST FISH OIL) LIQD Take by mouth.    [provider]  omeprazole (PRILOSEC) 20 MG capsule Take 20 mg by mouth daily as needed.    [provider]  potassium chloride (KLOR-CON) 10 MEQ tablet TAKE 3 TABLETS(30 MEQ) BY MOUTH TWICE DAILY 06/07/21   Seabron Spates  R, DO  promethazine-dextromethorphan (PROMETHAZINE-DM) 6.25-15 MG/5ML syrup Take 5 mLs by mouth 4 (four) times daily as needed. 06/27/21   Seabron Spates R, DO  rosuvastatin (CRESTOR) 10 MG tablet TAKE 1 TABLET(10 MG) BY MOUTH AT BEDTIME 05/01/21   Zola Button, Grayling Congress, DO  Vitamin D, Ergocalciferol, (DRISDOL) 1.25 MG (50000 UNIT) CAPS capsule TAKE 1 CAPSULE BY MOUTH EVERY WEEK Patient not taking: Reported on 06/27/2021 08/12/20   [name], DO    Allergies    Amoxicillin  Review of Systems   Review of Systems  Constitutional:  Negative for activity change, appetite change, chills, fatigue and fever.  HENT:  Negative for ear pain and sore throat.   Eyes:  Negative for pain and visual disturbance.  Respiratory:  Negative for cough, chest tightness and shortness of breath.   Cardiovascular:  Negative for chest pain and palpitations.   Gastrointestinal:  Negative for abdominal pain, nausea and vomiting.  Genitourinary:  Negative for dysuria and hematuria.  Musculoskeletal:  Positive for arthralgias. Negative for back pain, joint swelling, myalgias and neck pain.  Skin:  Negative for color change, rash and wound.  Neurological:  Negative for dizziness, seizures, syncope, facial asymmetry, speech difficulty, weakness, light-headedness, numbness and headaches.  Psychiatric/Behavioral:  Negative for confusion and decreased concentration.   All other systems reviewed and are negative.  Physical Exam Updated Vital Signs BP 138/77 (BP Location: Right Arm)   Pulse 64   Temp 98.1 F (36.7 C) (Oral)   Resp 20   Ht 5\' 1"  (1.549 m)   Wt 65.3 kg   SpO2 100%   BMI 27.21 kg/m   Physical Exam Vitals and nursing note reviewed.  Constitutional:      General: She is not in acute distress.    Appearance: Normal appearance. She is well-developed. She is not ill-appearing, toxic-appearing or diaphoretic.  HENT:     Head: Normocephalic and atraumatic.     Right Ear: External ear normal.     Left Ear: External ear normal.     Nose: Nose normal.  Eyes:     General: No visual field deficit.    Extraocular Movements: Extraocular movements intact.     Conjunctiva/sclera: Conjunctivae normal.  Cardiovascular:     Rate and Rhythm: Normal rate and regular rhythm.     Heart sounds: No murmur heard. Pulmonary:     Effort: Pulmonary effort is normal. No respiratory distress.     Breath sounds: Normal breath sounds.  Abdominal:     Palpations: Abdomen is soft.     Tenderness: There is no abdominal tenderness.  Musculoskeletal:        General: Tenderness present. No swelling or deformity.     Cervical back: Neck supple.     Right lower leg: No edema.     Left lower leg: No edema.  Skin:    General: Skin is warm and dry.  Neurological:     General: No focal deficit present.     Mental Status: She is alert and oriented to person,  place, and time.     Cranial Nerves: Cranial nerves are intact. No cranial nerve deficit, dysarthria or facial asymmetry.     Sensory: Sensation is intact. No sensory deficit.     Motor: No weakness or abnormal muscle tone.     Coordination: Coordination is intact. Coordination normal. Finger-Nose-Finger Test normal.  Psychiatric:        Mood and Affect: Mood normal.  Behavior: Behavior normal.        Thought Content: Thought content normal.        Judgment: Judgment normal.    ED Results / Procedures / Treatments   Labs (all labs ordered are listed, but only abnormal results are displayed) Labs Reviewed  BASIC METABOLIC PANEL - Abnormal; Notable for the following components:      Result Value   GFR, Estimated 60 (*)    All other components within normal limits  CBC  TROPONIN I (HIGH SENSITIVITY)    EKG EKG Interpretation  Date/Time:  Wednesday August 09 2021 12:05:23 EDT Ventricular Rate:  69 PR Interval:  140 QRS Duration: 74 QT Interval:  378 QTC Calculation: 405 R Axis:   48 Text Interpretation: Normal sinus rhythm Low voltage QRS Cannot rule out Anterior infarct , age undetermined Abnormal ECG Confirmed by Gloris Manchester [phone]) on 08/09/2021 3:55:01 PM  Radiology DG Shoulder Left  Result Date: 08/09/2021 CLINICAL DATA:  Left shoulder pain EXAM: LEFT SHOULDER - 2+ VIEW COMPARISON:  None. FINDINGS: There is no acute fracture or dislocation. Glenohumeral and acromioclavicular alignment is maintained. There is mild acromioclavicular joint space narrowing. The glenohumeral joint space appears preserved. The soft tissues are unremarkable. IMPRESSION: No acute fracture or dislocation. Mild acromioclavicular joint space narrowing. Electronically Signed   By: Lesia Hausen M.D.   On: 08/09/2021 16:12    Procedures Procedures   Medications Ordered in ED Medications  acetaminophen (TYLENOL) tablet 650 mg (650 mg Oral Given 08/09/21 1604)    ED Course  I have reviewed the  triage vital signs and the nursing notes.  Pertinent labs & imaging results that were available during my care of the patient were reviewed by me and considered in my medical decision making (see chart for details).    MDM Rules/Calculators/A&P                           Patient presents for left shoulder pain over the past 3 to 4 days.  She is well-appearing on exam.  She has no focal neurologic deficits.  Tenderness is present in anterior aspect of her left shoulder.  Shoulder abduction is limited to 90 degrees due to pain.  EKG was reassuring and troponin was negative.  Do not suspect referred pain from cardiac etiology.  Shoulder x-ray showed mild AC joint narrowing without evidence of acute injury.  Presentation is consistent with a musculoskeletal etiology, possible shoulder impingement syndrome.  Patient was given instructions on gentle exercises to do at home to improve left shoulder mobility.  She was advised to follow-up with her primary care doctor for further management.  She was discharged in good condition.  Final Clinical Impression(s) / ED Diagnoses Final diagnoses:  Acute pain of left shoulder    Rx / DC Orders ED Discharge Orders     None        Gloris Manchester, MD 08/10/21 0107

## 2021-08-09 NOTE — ED Triage Notes (Signed)
Reports left shoulder pain for the last 3-4 days.  Having difficulty lifting the arm.  Concerned due to a hx of a small aneurysm.

## 2021-08-11 ENCOUNTER — Encounter: Payer: Self-pay | Admitting: Family Medicine

## 2021-08-11 ENCOUNTER — Ambulatory Visit: Payer: Medicare PPO | Admitting: Family Medicine

## 2021-08-11 ENCOUNTER — Other Ambulatory Visit: Payer: Self-pay

## 2021-08-11 VITALS — BP 112/72 | HR 72 | Temp 98.2°F | Resp 18 | Ht 61.0 in | Wt 144.0 lb

## 2021-08-11 DIAGNOSIS — M778 Other enthesopathies, not elsewhere classified: Secondary | ICD-10-CM | POA: Insufficient documentation

## 2021-08-11 DIAGNOSIS — M25512 Pain in left shoulder: Secondary | ICD-10-CM | POA: Diagnosis not present

## 2021-08-11 DIAGNOSIS — M65812 Other synovitis and tenosynovitis, left shoulder: Secondary | ICD-10-CM | POA: Insufficient documentation

## 2021-08-11 DIAGNOSIS — M12812 Other specific arthropathies, not elsewhere classified, left shoulder: Secondary | ICD-10-CM | POA: Insufficient documentation

## 2021-08-11 MED ORDER — PREDNISONE 10 MG PO TABS
ORAL_TABLET | ORAL | 0 refills | Status: DC
Start: 2021-08-11 — End: 2022-02-22

## 2021-08-11 NOTE — Progress Notes (Signed)
Subjective:   By signing my name below, I, Zite Okoli, attest that this documentation has been prepared under the direction and in the presence of [name], DO. 08/11/2021    Patient ID: Faith Webb, female    DOB: 1941-03-12, 80 y.o.   MRN: [id number]  Chief Complaint  Patient presents with   Shoulder Pain    X3-4 days, Pt was seen in the ED on the 08/09/21. ED did EKG, X-ray, and labs. Pt states no injury or falls.     HPI Patient is in today for an office visit.  She reports she has having pain in her left shoulder. She denies falls. Earlier this week, the pain was more intense and she was admitted to the ED on 08/09/2021 and was given tylenol to manage the pain. She regularly exercises but does not lift weights.  At this visit, she can raise her arm up but still feels pain in the shoulder. She is actively seeing a sports doctor for her leg and is going to call him for further evaluation of the shoulder. She was recommended some exercises after she was discharged from the ED.   She also reports that she is not sleeping well  but does not want any medication to help.  She is not willing to get the flu vaccine at this visit.  Past Medical History:  Diagnosis Date   Allergy    Arthritis    Hyperlipidemia    Hypertension     Past Surgical History:  Procedure Laterality Date   ABDOMINAL HYSTERECTOMY     BREAST BIOPSY Bilateral    BREAST SURGERY     EYE SURGERY      Family History  Problem Relation Age of Onset   Diabetes Mother    Heart disease Mother    Hypertension Mother    Stroke Mother    Diabetes Father    Cancer Sister    Diabetes Sister    Diabetes Brother    Hyperlipidemia Brother    Stroke Brother    Diabetes Sister    Hyperlipidemia Sister    Hypertension Sister    Kidney disease Sister    Stroke Sister    Diabetes Sister    Hyperlipidemia Sister    Healthy Child     Social History   Socioeconomic History   Marital status:  Widowed    Spouse name: Not on file   Number of children: 2   Years of education: Not on file   Highest education level: Bachelor's degree (e.g., BA, AB, BS)  Occupational History   Not on file  Tobacco Use   Smoking status: Never   Smokeless tobacco: Never  Vaping Use   Vaping Use: Never used  Substance and Sexual Activity   Alcohol use: Never   Drug use: Never   Sexual activity: Not on file  Other Topics Concern   Not on file  Social History Narrative   Pt lives alone in 1 story home, she has 2 children, right handed, she drinks coffee daily, tea/ soda occasionally.   Social Determinants of Health   Financial Resource Strain: Low Risk    Difficulty of Paying Living Expenses: Not hard at all  Food Insecurity: No Food Insecurity   Worried About Programme researcher, broadcasting/film/video in the Last Year: Never true   Ran Out of Food in the Last Year: Never true  Transportation Needs: No Transportation Needs   Lack of Transportation (Medical): No  Lack of Transportation (Non-Medical): No  Physical Activity: Sufficiently Active   Days of Exercise per Week: 3 days   Minutes of Exercise per Session: 60 min  Stress: No Stress Concern Present   Feeling of Stress : Not at all  Social Connections: Moderately Integrated   Frequency of Communication with Friends and Family: More than three times a week   Frequency of Social Gatherings with Friends and Family: More than three times a week   Attends Religious Services: More than 4 times per year   Active Member of [employer] or Organizations: Yes   Attends Banker Meetings: More than 4 times per year   Marital Status: Widowed  Catering manager Violence: Not At Risk   Fear of Current or Ex-Partner: No   Emotionally Abused: No   Physically Abused: No   Sexually Abused: No    Outpatient Medications Prior to Visit  Medication Sig Dispense Refill   hydrochlorothiazide (HYDRODIURIL) 25 MG tablet TAKE 1 TABLET(25 MG) BY MOUTH DAILY 90 tablet 1    Magnesium Oxide 420 MG TABS Take 0.5 tablets by mouth daily.      metoprolol tartrate (LOPRESSOR) 25 MG tablet TAKE 1 TABLET(25 MG) BY MOUTH TWICE DAILY 60 tablet 2   Omega-3 Fatty Acids (THE VERY FINEST FISH OIL) LIQD Take by mouth.     omeprazole (PRILOSEC) 20 MG capsule Take 20 mg by mouth daily as needed.     potassium chloride (KLOR-CON) 10 MEQ tablet TAKE 3 TABLETS(30 MEQ) BY MOUTH TWICE DAILY 270 tablet 1   rosuvastatin (CRESTOR) 10 MG tablet TAKE 1 TABLET(10 MG) BY MOUTH AT BEDTIME 90 tablet 1   Vitamin D, Ergocalciferol, (DRISDOL) 1.25 MG (50000 UNIT) CAPS capsule TAKE 1 CAPSULE BY MOUTH EVERY WEEK 12 capsule 3   promethazine-dextromethorphan (PROMETHAZINE-DM) 6.25-15 MG/5ML syrup Take 5 mLs by mouth 4 (four) times daily as needed. 118 mL 0   No facility-administered medications prior to visit.    Allergies  Allergen Reactions   Amoxicillin Palpitations    Review of Systems  Constitutional:  Negative for fever.  HENT:  Negative for congestion, ear pain, hearing loss, sinus pain and sore throat.   Eyes:  Negative for pain.  Respiratory:  Negative for cough, shortness of breath and wheezing.   Cardiovascular:  Negative for chest pain and palpitations.  Gastrointestinal:  Negative for blood in stool, constipation, diarrhea, nausea and vomiting.  Genitourinary:  Negative for dysuria, frequency and hematuria.  Musculoskeletal:  Positive for joint pain (shoulder). Negative for back pain.  Neurological:  Negative for headaches.  Psychiatric/Behavioral:  Negative for depression. The patient is not nervous/anxious.       Objective:    Physical Exam Constitutional:      General: She is not in acute distress.    Appearance: Normal appearance. She is not ill-appearing.  HENT:     Head: Normocephalic and atraumatic.     Right Ear: External ear normal.     Left Ear: External ear normal.  Eyes:     Extraocular Movements: Extraocular movements intact.     Pupils: Pupils are equal,  round, and reactive to light.  Cardiovascular:     Rate and Rhythm: Normal rate and regular rhythm.     Pulses: Normal pulses.     Heart sounds: Normal heart sounds. No murmur heard.   No gallop.  Pulmonary:     Effort: Pulmonary effort is normal. No respiratory distress.     Breath sounds: Normal breath sounds. No  wheezing, rhonchi or rales.  Abdominal:     General: Bowel sounds are normal. There is no distension.     Palpations: Abdomen is soft. There is no mass.     Tenderness: There is no abdominal tenderness. There is no guarding or rebound.     Hernia: No hernia is present.  Musculoskeletal:     Cervical back: Normal range of motion and neck supple.     Comments: tenderness in shoulder joint when arm is raised , has full range of motion  Lymphadenopathy:     Cervical: No cervical adenopathy.  Skin:    General: Skin is warm and dry.  Neurological:     Mental Status: She is alert and oriented to person, place, and time.  Psychiatric:        Behavior: Behavior normal.    BP 112/72 (BP Location: Right Arm, Patient Position: Sitting, Cuff Size: Normal)   Pulse 72   Temp 98.2 F (36.8 C) (Oral)   Resp 18   Ht 5\' 1"  (1.549 m)   Wt 144 lb (65.3 kg)   SpO2 98%   BMI 27.21 kg/m  Wt Readings from Last 3 Encounters:  08/11/21 144 lb (65.3 kg)  08/09/21 144 lb (65.3 kg)  06/27/21 142 lb 6.4 oz (64.6 kg)    Diabetic Foot Exam - Simple   No data filed    Lab Results  Component Value Date   WBC 4.2 08/09/2021   HGB 13.8 08/09/2021   HCT 40.9 08/09/2021   PLT 258 08/09/2021   GLUCOSE 84 08/09/2021   CHOL 122 06/27/2021   TRIG 119.0 06/27/2021   HDL 54.30 06/27/2021   LDLCALC 44 06/27/2021   ALT 14 06/27/2021   AST 12 06/27/2021   NA 139 08/09/2021   K 4.0 08/09/2021   CL 103 08/09/2021   CREATININE 0.96 08/09/2021   BUN 17 08/09/2021   CO2 28 08/09/2021   TSH 0.52 12/27/2020    Lab Results  Component Value Date   TSH 0.52 12/27/2020   Lab Results   Component Value Date   WBC 4.2 08/09/2021   HGB 13.8 08/09/2021   HCT 40.9 08/09/2021   MCV 89.3 08/09/2021   PLT 258 08/09/2021   Lab Results  Component Value Date   NA 139 08/09/2021   K 4.0 08/09/2021   CO2 28 08/09/2021   GLUCOSE 84 08/09/2021   BUN 17 08/09/2021   CREATININE 0.96 08/09/2021   BILITOT 0.6 06/27/2021   ALKPHOS 47 06/27/2021   AST 12 06/27/2021   ALT 14 06/27/2021   PROT 6.7 06/27/2021   ALBUMIN 4.2 06/27/2021   CALCIUM 9.3 08/09/2021   ANIONGAP 8 08/09/2021   GFR 42.35 (L) 06/27/2021   Lab Results  Component Value Date   CHOL 122 06/27/2021   Lab Results  Component Value Date   HDL 54.30 06/27/2021   Lab Results  Component Value Date   LDLCALC 44 06/27/2021   Lab Results  Component Value Date   TRIG 119.0 06/27/2021   Lab Results  Component Value Date   CHOLHDL 2 06/27/2021   No results found for: HGBA1C     Assessment & Plan:   Problem List Items Addressed This Visit   None Visit Diagnoses     Acute pain of left shoulder    -  Primary   Relevant Medications   predniSONE (DELTASONE) 10 MG tablet   Other Relevant Orders   Ambulatory referral to Sports Medicine  Meds ordered this encounter  Medications   predniSONE (DELTASONE) 10 MG tablet    Sig: TAKE 3 TABLETS PO QD FOR 3 DAYS THEN TAKE 2 TABLETS PO QD FOR 3 DAYS THEN TAKE 1 TABLET PO QD FOR 3 DAYS THEN TAKE 1/2 TAB PO QD FOR 3 DAYS    Dispense:  20 tablet    Refill:  0    I,Zite Okoli,acting as a Neurosurgeon for [employer], DO.,have documented all relevant documentation on the behalf of [name], DO,as directed by  [name], DO while in the presence of [name], DO.   I, [name], DO., personally preformed the services described in this documentation.  All medical record entries made by the scribe were at my direction and in my presence.  I have reviewed the chart and discharge instructions (if applicable) and  agree that the record reflects my personal performance and is accurate and complete. 08/11/2021

## 2021-08-11 NOTE — Patient Instructions (Signed)
Shoulder Pain °Many things can cause shoulder pain, including: °An injury to the shoulder. °Overuse of the shoulder. °Arthritis. °The source of the pain can be: °Inflammation. °An injury to the shoulder joint. °An injury to a tendon, ligament, or bone. °Follow these instructions at home: °Pay attention to changes in your symptoms. Let your health care provider know about them. Follow these instructions to relieve your pain. °If you have a sling: °Wear the sling as told by your health care provider. Remove it only as told by your health care provider. °Loosen the sling if your fingers tingle, become numb, or turn cold and blue. °Keep the sling clean. °If the sling is not waterproof: °Do not let it get wet. Remove it to shower or bathe. °Move your arm as little as possible, but keep your hand moving to prevent swelling. °Managing pain, stiffness, and swelling ° °If directed, put ice on the painful area: °Put ice in a plastic bag. °Place a towel between your skin and the bag. °Leave the ice on for 20 minutes, 2-3 times per day. Stop applying ice if it does not help with the pain. °Squeeze a soft ball or a foam pad as much as possible. This helps to keep the shoulder from swelling. It also helps to strengthen the arm. °General instructions °Take over-the-counter and prescription medicines only as told by your health care provider. °Keep all follow-up visits as told by your health care provider. This is important. °Contact a health care provider if: °Your pain gets worse. °Your pain is not relieved with medicines. °New pain develops in your arm, hand, or fingers. °Get help right away if: °Your arm, hand, or fingers: °Tingle. °Become numb. °Become swollen. °Become painful. °Turn white or blue. °Summary °Shoulder pain can be caused by an injury, overuse, or arthritis. °Pay attention to changes in your symptoms. Let your health care provider know about them. °This condition may be treated with a sling, ice, and pain  medicines. °Contact your health care provider if the pain gets worse or new pain develops. Get help right away if your arm, hand, or fingers tingle or become numb, swollen, or painful. °Keep all follow-up visits as told by your health care provider. This is important. °This information is not intended to replace advice given to you by your health care provider. Make sure you discuss any questions you have with your health care provider. °Document Revised: 05/27/2018 Document Reviewed: 05/27/2018 °Elsevier Patient Education © 2022 [employer] Inc[employer] ° °

## 2021-08-11 NOTE — Assessment & Plan Note (Signed)
Ice / heat  pred taper  Refer to sport med

## 2021-08-15 ENCOUNTER — Ambulatory Visit: Payer: Medicare PPO | Admitting: Family Medicine

## 2021-08-15 ENCOUNTER — Other Ambulatory Visit: Payer: Self-pay

## 2021-08-15 ENCOUNTER — Ambulatory Visit: Payer: Self-pay

## 2021-08-15 VITALS — Ht 61.0 in | Wt 144.0 lb

## 2021-08-15 DIAGNOSIS — M65812 Other synovitis and tenosynovitis, left shoulder: Secondary | ICD-10-CM

## 2021-08-15 DIAGNOSIS — M25512 Pain in left shoulder: Secondary | ICD-10-CM

## 2021-08-15 MED ORDER — NAPROXEN 500 MG PO TABS: 500.0000 mg | ORAL_TABLET | Freq: Two times a day (BID) | ORAL | 1 refills | Status: DC

## 2021-08-15 NOTE — Assessment & Plan Note (Signed)
Has inflammatory changes in and around the shoulder.  Does have a hyperechoic lucency which could demonstrate a gouty origin. -Counseled on home exercise therapy and supportive care. -Counseled on finishing prednisone. -Would initiate naproxen after the prednisone. -Could consider injection or lab testing.

## 2021-08-15 NOTE — Patient Instructions (Signed)
Good to see you Please start the naproxen after the prednisone. Please take it until 2 days of being pain free   Please send me a message in MyChart with any questions or updates.  Please see me back in 3 weeks .   --Dr. Jordan Likes

## 2021-08-15 NOTE — Progress Notes (Signed)
Faith Webb - 80 y.o. female MRN [id number]  Date of birth: 08/25/1941  SUBJECTIVE:  Including CC & ROS.  No chief complaint on file.   Faith Webb is a 80 y.o. female that is presenting with acute left shoulder pain.  No injury or inciting event.  Has gotten improvement with the prednisone..  Independent review of the left shoulder x-ray from 9/14 shows no acute changes.   Review of Systems See HPI   HISTORY: Past Medical, Surgical, Social, and Family History Reviewed & Updated per EMR.   Pertinent Historical Findings include:  Past Medical History:  Diagnosis Date   Allergy    Arthritis    Hyperlipidemia    Hypertension     Past Surgical History:  Procedure Laterality Date   ABDOMINAL HYSTERECTOMY     BREAST BIOPSY Bilateral    BREAST SURGERY     EYE SURGERY      Family History  Problem Relation Age of Onset   Diabetes Mother    Heart disease Mother    Hypertension Mother    Stroke Mother    Diabetes Father    Cancer Sister    Diabetes Sister    Diabetes Brother    Hyperlipidemia Brother    Stroke Brother    Diabetes Sister    Hyperlipidemia Sister    Hypertension Sister    Kidney disease Sister    Stroke Sister    Diabetes Sister    Hyperlipidemia Sister    Healthy Child     Social History   Socioeconomic History   Marital status: Widowed    Spouse name: Not on file   Number of children: 2   Years of education: Not on file   Highest education level: Bachelor's degree (e.g., BA, AB, BS)  Occupational History   Not on file  Tobacco Use   Smoking status: Never   Smokeless tobacco: Never  Vaping Use   Vaping Use: Never used  Substance and Sexual Activity   Alcohol use: Never   Drug use: Never   Sexual activity: Not on file  Other Topics Concern   Not on file  Social History Narrative   Pt lives alone in 1 story home, she has 2 children, right handed, she drinks coffee daily, tea/ soda occasionally.   Social Determinants of Health    Financial Resource Strain: Low Risk    Difficulty of Paying Living Expenses: Not hard at all  Food Insecurity: No Food Insecurity   Worried About Programme researcher, broadcasting/film/video in the Last Year: Never true   Ran Out of Food in the Last Year: Never true  Transportation Needs: No Transportation Needs   Lack of Transportation (Medical): No   Lack of Transportation (Non-Medical): No  Physical Activity: Sufficiently Active   Days of Exercise per Week: 3 days   Minutes of Exercise per Session: 60 min  Stress: No Stress Concern Present   Feeling of Stress : Not at all  Social Connections: Moderately Integrated   Frequency of Communication with Friends and Family: More than three times a week   Frequency of Social Gatherings with Friends and Family: More than three times a week   Attends Religious Services: More than 4 times per year   Active Member of [employer] or Organizations: Yes   Attends Banker Meetings: More than 4 times per year   Marital Status: Widowed  Intimate Partner Violence: Not At Risk   Fear of Current or Ex-Partner: No  Emotionally Abused: No   Physically Abused: No   Sexually Abused: No     PHYSICAL EXAM:  VS: Ht 5\' 1"  (1.549 m)   Wt 144 lb (65.3 kg)   BMI 27.21 kg/m  Physical Exam Gen: NAD, alert, cooperative with exam, well-appearing   Limited ultrasound: Left shoulder:  Normal-appearing biceps tendon but does have encircling effusion. Has overlying subscapularis bursitis with hyperechoic change to suggest a gouty or inflammatory origin. Large subacromial bursitis with degenerative changes of the supraspinatus.  Summary: Synovitis of the left shoulder  Ultrasound and interpretation by , MD     ASSESSMENT & PLAN:   Synovitis of left shoulder Has inflammatory changes in and around the shoulder.  Does have a hyperechoic lucency which could demonstrate a gouty origin. -Counseled on home exercise therapy and supportive care. -Counseled  on finishing prednisone. -Would initiate naproxen after the prednisone. -Could consider injection or lab testing.

## 2021-08-16 ENCOUNTER — Ambulatory Visit: Payer: Medicare PPO | Admitting: Family Medicine

## 2021-08-17 ENCOUNTER — Other Ambulatory Visit: Payer: Self-pay | Admitting: Family Medicine

## 2021-09-01 ENCOUNTER — Other Ambulatory Visit: Payer: Self-pay | Admitting: Family Medicine

## 2021-09-05 ENCOUNTER — Ambulatory Visit: Payer: Medicare PPO | Admitting: Family Medicine

## 2021-09-05 VITALS — Ht 61.5 in | Wt 144.0 lb

## 2021-09-05 DIAGNOSIS — M65812 Other synovitis and tenosynovitis, left shoulder: Secondary | ICD-10-CM | POA: Diagnosis not present

## 2021-09-05 NOTE — Assessment & Plan Note (Signed)
Does have some impingement on exam.  She would like to hold off on other medications or injection at this time. -Counseled on home exercise therapy and supportive care. -Uric acid, sed rate, CRP and ANA panel -Counseled on Voltaren -Could consider injection physical therapy.

## 2021-09-05 NOTE — Patient Instructions (Signed)
Good to see you Please try ice as needed  I will call with the results from today   Please send me a message in MyChart with any questions or updates.  Please see me back in 4-6 weeks.   --Dr. Jordan Likes

## 2021-09-05 NOTE — Progress Notes (Signed)
Faith Webb - 80 y.o. female MRN [id number]  Date of birth: [birth year]  SUBJECTIVE:  Including CC & ROS.  No chief complaint on file.   Faith Webb is a 80 y.o. female that is presenting with ongoing left shoulder pain.  She has gotten improvement with the medications but still has some stiffness in the shoulder.   Review of Systems See HPI   HISTORY: Past Medical, Surgical, Social, and Family History Reviewed & Updated per EMR.   Pertinent Historical Findings include:  Past Medical History:  Diagnosis Date   Allergy    Arthritis    Hyperlipidemia    Hypertension     Past Surgical History:  Procedure Laterality Date   ABDOMINAL HYSTERECTOMY     BREAST BIOPSY Bilateral    BREAST SURGERY     EYE SURGERY      Family History  Problem Relation Age of Onset   Diabetes Mother    Heart disease Mother    Hypertension Mother    Stroke Mother    Diabetes Father    Cancer Sister    Diabetes Sister    Diabetes Brother    Hyperlipidemia Brother    Stroke Brother    Diabetes Sister    Hyperlipidemia Sister    Hypertension Sister    Kidney disease Sister    Stroke Sister    Diabetes Sister    Hyperlipidemia Sister    Healthy Child     Social History   Socioeconomic History   Marital status: Widowed    Spouse name: Not on file   Number of children: 2   Years of education: Not on file   Highest education level: Bachelor's degree (e.g., BA, AB, BS)  Occupational History   Not on file  Tobacco Use   Smoking status: Never   Smokeless tobacco: Never  Vaping Use   Vaping Use: Never used  Substance and Sexual Activity   Alcohol use: Never   Drug use: Never   Sexual activity: Not on file  Other Topics Concern   Not on file  Social History Narrative   Pt lives alone in 1 story home, she has 2 children, right handed, she drinks coffee daily, tea/ soda occasionally.   Social Determinants of Health   Financial Resource Strain: Low Risk    Difficulty of Paying  Living Expenses: Not hard at all  Food Insecurity: No Food Insecurity   Worried About Programme researcher, broadcasting/film/video in the Last Year: Never true   Ran Out of Food in the Last Year: Never true  Transportation Needs: No Transportation Needs   Lack of Transportation (Medical): No   Lack of Transportation (Non-Medical): No  Physical Activity: Sufficiently Active   Days of Exercise per Week: 3 days   Minutes of Exercise per Session: 60 min  Stress: No Stress Concern Present   Feeling of Stress : Not at all  Social Connections: Moderately Integrated   Frequency of Communication with Friends and Family: More than three times a week   Frequency of Social Gatherings with Friends and Family: More than three times a week   Attends Religious Services: More than 4 times per year   Active Member of [employer] or Organizations: Yes   Attends Banker Meetings: More than 4 times per year   Marital Status: Widowed  Intimate Partner Violence: Not At Risk   Fear of Current or Ex-Partner: No   Emotionally Abused: No   Physically Abused: No  Sexually Abused: No     PHYSICAL EXAM:  VS: Ht 5' 1.5" (1.562 m)   Wt 144 lb (65.3 kg)   BMI 26.77 kg/m  Physical Exam Gen: NAD, alert, cooperative with exam, well-appearing      ASSESSMENT & PLAN:   Synovitis of left shoulder Does have some impingement on exam.  She would like to hold off on other medications or injection at this time. -Counseled on home exercise therapy and supportive care. -Uric acid, sed rate, CRP and ANA panel -Counseled on Voltaren -Could consider injection physical therapy.

## 2021-09-08 LAB — ANA,IFA RA DIAG PNL W/RFLX TIT/PATN
ANA Titer 1: NEGATIVE
Cyclic Citrullin Peptide Ab: 10 units (ref 0–19)
Rheumatoid fact SerPl-aCnc: <10 IU/mL (ref <14.0)

## 2021-09-08 LAB — URIC ACID: Uric Acid: 6.6 mg/dL (ref 3.1–7.9)

## 2021-09-08 LAB — C-REACTIVE PROTEIN: CRP: <1 mg/L (ref 0–10)

## 2021-09-08 LAB — SEDIMENTATION RATE: Sed Rate: 4 mm/hr (ref 0–40)

## 2021-09-11 ENCOUNTER — Telehealth: Payer: Self-pay | Admitting: Family Medicine

## 2021-09-11 NOTE — Telephone Encounter (Signed)
Pt informed of below.  

## 2021-09-11 NOTE — Telephone Encounter (Signed)
Left VM for patient. If she calls back please have her speak with a nurse/CMA and inform that her uric acid was slightly increased. It could be gout as the source of her problem. All other labs are normal. We can continue the monitor for now.   If any questions then please take the best time and phone number to call and I will try to call her back.   Myra Rude, MD [facility] 09/11/2021, 9:07 AM

## 2021-09-19 ENCOUNTER — Encounter: Payer: Self-pay | Admitting: Family Medicine

## 2021-09-19 ENCOUNTER — Ambulatory Visit: Payer: Medicare PPO | Admitting: Family Medicine

## 2021-09-19 ENCOUNTER — Ambulatory Visit: Payer: Self-pay

## 2021-09-19 VITALS — BP 140/72 | Ht 61.5 in | Wt 144.0 lb

## 2021-09-19 DIAGNOSIS — M65812 Other synovitis and tenosynovitis, left shoulder: Secondary | ICD-10-CM

## 2021-09-19 DIAGNOSIS — M65912 Unspecified synovitis and tenosynovitis, left shoulder: Secondary | ICD-10-CM

## 2021-09-19 MED ORDER — TRIAMCINOLONE ACETONIDE 40 MG/ML IJ SUSP
40.0000 mg | Freq: Once | INTRAMUSCULAR | Status: AC
  Administered 2021-09-19: 40 mg via INTRA_ARTICULAR

## 2021-09-19 NOTE — Patient Instructions (Signed)
Good to see you Please try heat before exercise and ice after  Please continue the exercises   Please send me a message in MyChart with any questions or updates.  Please see me back in 4 weeks.   --Dr. Jordan Likes

## 2021-09-19 NOTE — Progress Notes (Signed)
Faith Webb - 80 y.o. female MRN [id number]  Date of birth: [birth year]  SUBJECTIVE:  Including CC & ROS.  No chief complaint on file.   Faith Webb is a 80 y.o. female that is presenting with acute worsening of her left shoulder pain.  We have tried medications and home exercises with limited improvement.  She still having significant pain she lies on the affected side.  Lab work was showing elevated uric acid at 6.6..   Review of Systems See HPI   HISTORY: Past Medical, Surgical, Social, and Family History Reviewed & Updated per EMR.   Pertinent Historical Findings include:  Past Medical History:  Diagnosis Date   Allergy    Arthritis    Hyperlipidemia    Hypertension     Past Surgical History:  Procedure Laterality Date   ABDOMINAL HYSTERECTOMY     BREAST BIOPSY Bilateral    BREAST SURGERY     EYE SURGERY      Family History  Problem Relation Age of Onset   Diabetes Mother    Heart disease Mother    Hypertension Mother    Stroke Mother    Diabetes Father    Cancer Sister    Diabetes Sister    Diabetes Brother    Hyperlipidemia Brother    Stroke Brother    Diabetes Sister    Hyperlipidemia Sister    Hypertension Sister    Kidney disease Sister    Stroke Sister    Diabetes Sister    Hyperlipidemia Sister    Healthy Child     Social History   Socioeconomic History   Marital status: Widowed    Spouse name: Not on file   Number of children: 2   Years of education: Not on file   Highest education level: Bachelor's degree (e.g., BA, AB, BS)  Occupational History   Not on file  Tobacco Use   Smoking status: Never   Smokeless tobacco: Never  Vaping Use   Vaping Use: Never used  Substance and Sexual Activity   Alcohol use: Never   Drug use: Never   Sexual activity: Not on file  Other Topics Concern   Not on file  Social History Narrative   Pt lives alone in 1 story home, she has 2 children, right handed, she drinks coffee daily, tea/ soda  occasionally.   Social Determinants of Health   Financial Resource Strain: Low Risk    Difficulty of Paying Living Expenses: Not hard at all  Food Insecurity: No Food Insecurity   Worried About Programme researcher, broadcasting/film/video in the Last Year: Never true   Ran Out of Food in the Last Year: Never true  Transportation Needs: No Transportation Needs   Lack of Transportation (Medical): No   Lack of Transportation (Non-Medical): No  Physical Activity: Sufficiently Active   Days of Exercise per Week: 3 days   Minutes of Exercise per Session: 60 min  Stress: No Stress Concern Present   Feeling of Stress : Not at all  Social Connections: Moderately Integrated   Frequency of Communication with Friends and Family: More than three times a week   Frequency of Social Gatherings with Friends and Family: More than three times a week   Attends Religious Services: More than 4 times per year   Active Member of [employer] or Organizations: Yes   Attends Banker Meetings: More than 4 times per year   Marital Status: Widowed  Intimate Partner Violence: Not At Risk  Fear of Current or Ex-Partner: No   Emotionally Abused: No   Physically Abused: No   Sexually Abused: No     PHYSICAL EXAM:  VS: BP 140/72 (BP Location: Right Arm, Patient Position: Sitting)   Ht 5' 1.5" (1.562 m)   Wt 144 lb (65.3 kg)   BMI 26.77 kg/m  Physical Exam Gen: NAD, alert, cooperative with exam, well-appearing    Aspiration/Injection Procedure Note Faith Webb 1941-01-19  Procedure: Injection Indications: Left shoulder pain  Procedure Details Consent: Risks of procedure as well as the alternatives and risks of each were explained to the (patient/caregiver).  Consent for procedure obtained. Time Out: Verified patient identification, verified procedure, site/side was marked, verified correct patient position, special equipment/implants available, medications/allergies/relevent history reviewed, required imaging and test  results available.  Performed.  The area was cleaned with iodine and alcohol swabs.    The left glenohumeral joint was injected using 3 cc 1% lidocaine on a 22-gauge 3-1/2 inch needle.  The syringe was switched and a mixture containing 1 cc's of 40 mg Kenalog and 4 cc's of 0.25% bupivacaine was injected.  Ultrasound was used. Images were obtained in short views showing the injection.     A sterile dressing was applied.  Patient did tolerate procedure well.      ASSESSMENT & PLAN:   Synovitis of left shoulder Little improvement thus far.  Still has limitations in her abduction and external rotation.  May have underlying gout that could be contributing to her changes. -Counseled on home exercise therapy and supportive care. -Injection today. -Could consider physical therapy.

## 2021-09-19 NOTE — Addendum Note (Signed)
Addended by: Merrilyn Puma on: 09/19/2021 04:24 PM   Modules accepted: Orders

## 2021-09-19 NOTE — Assessment & Plan Note (Signed)
Little improvement thus far.  Still has limitations in her abduction and external rotation.  May have underlying gout that could be contributing to her changes. -Counseled on home exercise therapy and supportive care. -Injection today. -Could consider physical therapy.

## 2021-10-09 ENCOUNTER — Other Ambulatory Visit (HOSPITAL_BASED_OUTPATIENT_CLINIC_OR_DEPARTMENT_OTHER): Payer: Self-pay

## 2021-10-09 MED ORDER — INFLUENZA VAC A&B SA ADJ QUAD 0.5 ML IM PRSY
PREFILLED_SYRINGE | INTRAMUSCULAR | 0 refills | Status: DC
  Filled 2021-10-09: qty 0.5, 1d supply, fill #0

## 2021-10-10 ENCOUNTER — Ambulatory Visit: Payer: Medicare PPO | Admitting: Family Medicine

## 2021-10-13 DIAGNOSIS — H04123 Dry eye syndrome of bilateral lacrimal glands: Secondary | ICD-10-CM | POA: Diagnosis not present

## 2021-10-13 DIAGNOSIS — H02423 Myogenic ptosis of bilateral eyelids: Secondary | ICD-10-CM | POA: Diagnosis not present

## 2021-10-13 DIAGNOSIS — H02132 Senile ectropion of right lower eyelid: Secondary | ICD-10-CM | POA: Diagnosis not present

## 2021-10-13 DIAGNOSIS — H11232 Symblepharon, left eye: Secondary | ICD-10-CM | POA: Diagnosis not present

## 2021-10-17 ENCOUNTER — Ambulatory Visit: Payer: Medicare PPO | Admitting: Family Medicine

## 2021-10-17 ENCOUNTER — Encounter: Payer: Self-pay | Admitting: Family Medicine

## 2021-10-17 VITALS — BP 118/78 | Ht 61.5 in | Wt 144.0 lb

## 2021-10-17 DIAGNOSIS — M778 Other enthesopathies, not elsewhere classified: Secondary | ICD-10-CM

## 2021-10-17 NOTE — Progress Notes (Signed)
Faith Webb - 80 y.o. female MRN [id number]  Date of birth: 1941/11/11  SUBJECTIVE:  Including CC & ROS.  No chief complaint on file.   Faith Webb is a 80 y.o. female that is following up for her left shoulder pain.  She did get significant improvement with the injection.  She started having symptoms recur in a minor fashion.   Review of Systems See HPI   HISTORY: Past Medical, Surgical, Social, and Family History Reviewed & Updated per EMR.   Pertinent Historical Findings include:  Past Medical History:  Diagnosis Date   Allergy    Arthritis    Hyperlipidemia    Hypertension     Past Surgical History:  Procedure Laterality Date   ABDOMINAL HYSTERECTOMY     BREAST BIOPSY Bilateral    BREAST SURGERY     EYE SURGERY      Family History  Problem Relation Age of Onset   Diabetes Mother    Heart disease Mother    Hypertension Mother    Stroke Mother    Diabetes Father    Cancer Sister    Diabetes Sister    Diabetes Brother    Hyperlipidemia Brother    Stroke Brother    Diabetes Sister    Hyperlipidemia Sister    Hypertension Sister    Kidney disease Sister    Stroke Sister    Diabetes Sister    Hyperlipidemia Sister    Healthy Child     Social History   Socioeconomic History   Marital status: Widowed    Spouse name: Not on file   Number of children: 2   Years of education: Not on file   Highest education level: Bachelor's degree (e.g., BA, AB, BS)  Occupational History   Not on file  Tobacco Use   Smoking status: Never   Smokeless tobacco: Never  Vaping Use   Vaping Use: Never used  Substance and Sexual Activity   Alcohol use: Never   Drug use: Never   Sexual activity: Not on file  Other Topics Concern   Not on file  Social History Narrative   Pt lives alone in 1 story home, she has 2 children, right handed, she drinks coffee daily, tea/ soda occasionally.   Social Determinants of Health   Financial Resource Strain: Low Risk    Difficulty  of Paying Living Expenses: Not hard at all  Food Insecurity: No Food Insecurity   Worried About Programme researcher, broadcasting/film/video in the Last Year: Never true   Ran Out of Food in the Last Year: Never true  Transportation Needs: No Transportation Needs   Lack of Transportation (Medical): No   Lack of Transportation (Non-Medical): No  Physical Activity: Sufficiently Active   Days of Exercise per Week: 3 days   Minutes of Exercise per Session: 60 min  Stress: No Stress Concern Present   Feeling of Stress : Not at all  Social Connections: Moderately Integrated   Frequency of Communication with Friends and Family: More than three times a week   Frequency of Social Gatherings with Friends and Family: More than three times a week   Attends Religious Services: More than 4 times per year   Active Member of [employer] or Organizations: Yes   Attends Banker Meetings: More than 4 times per year   Marital Status: Widowed  Intimate Partner Violence: Not At Risk   Fear of Current or Ex-Partner: No   Emotionally Abused: No   Physically  Abused: No   Sexually Abused: No     PHYSICAL EXAM:  VS: BP 118/78 (BP Location: Left Arm, Patient Position: Sitting)   Ht 5' 1.5" (1.562 m)   Wt 144 lb (65.3 kg)   BMI 26.77 kg/m  Physical Exam Gen: NAD, alert, cooperative with exam, well-appearing    ASSESSMENT & PLAN:   Capsulitis of left shoulder Seems to have more of a frozen shoulder type picture as to the ongoing pain and the reoccurrence. -Counseled on home exercise therapy and supportive care. -Could consider an intra-articular Toradol injection. -Could consider physical therapy.

## 2021-10-17 NOTE — Assessment & Plan Note (Signed)
Seems to have more of a frozen shoulder type picture as to the ongoing pain and the reoccurrence. -Counseled on home exercise therapy and supportive care. -Could consider an intra-articular Toradol injection. -Could consider physical therapy.

## 2021-10-17 NOTE — Patient Instructions (Signed)
Good to see you Please try heat before exercise and ice after  Please try voltaren  Please try the exercises   Please send me a message in MyChart with any questions or updates.  Please see me back in 6-8 weeks.   --Dr. Jordan Likes

## 2021-10-26 ENCOUNTER — Other Ambulatory Visit: Payer: Self-pay | Admitting: Family Medicine

## 2021-10-26 DIAGNOSIS — E785 Hyperlipidemia, unspecified: Secondary | ICD-10-CM

## 2021-10-26 DIAGNOSIS — I10 Essential (primary) hypertension: Secondary | ICD-10-CM

## 2021-11-14 ENCOUNTER — Ambulatory Visit: Payer: Medicare PPO | Attending: Internal Medicine

## 2021-11-14 DIAGNOSIS — Z23 Encounter for immunization: Secondary | ICD-10-CM

## 2021-11-14 NOTE — Progress Notes (Signed)
° °  Covid-19 Vaccination Clinic  Name:  Faith Webb    MRN: [id number] DOB: [birth year]  11/14/2021  [name] was observed post Covid-19 immunization for 15 minutes without incident. She was provided with Vaccine Information Sheet and instruction to access the V-Safe system.   [name] was instructed to call 911 with any severe reactions post vaccine: Difficulty breathing  Swelling of face and throat  A fast heartbeat  A bad rash all over body  Dizziness and weakness   Immunizations Administered     Name Date Dose VIS Date Route   Pfizer Covid-19 Vaccine Bivalent Booster 11/14/2021 12:25 PM 0.3 mL 07/26/2021 Intramuscular   Manufacturer: [employer], [employer]   Lot: [id number]   NDC: [phone]

## 2021-11-16 ENCOUNTER — Other Ambulatory Visit (HOSPITAL_BASED_OUTPATIENT_CLINIC_OR_DEPARTMENT_OTHER): Payer: Self-pay

## 2021-11-16 MED ORDER — PFIZER COVID-19 VAC BIVALENT 30 MCG/0.3ML IM SUSP
INTRAMUSCULAR | 0 refills | Status: DC
  Filled 2021-11-16: qty 0.3, 1d supply, fill #0

## 2021-12-02 ENCOUNTER — Other Ambulatory Visit: Payer: Self-pay | Admitting: Family Medicine

## 2021-12-15 DIAGNOSIS — I10 Essential (primary) hypertension: Secondary | ICD-10-CM | POA: Diagnosis not present

## 2021-12-15 DIAGNOSIS — Z0000 Encounter for general adult medical examination without abnormal findings: Secondary | ICD-10-CM | POA: Diagnosis not present

## 2021-12-15 DIAGNOSIS — M659 Synovitis and tenosynovitis, unspecified: Secondary | ICD-10-CM | POA: Diagnosis not present

## 2021-12-15 DIAGNOSIS — K219 Gastro-esophageal reflux disease without esophagitis: Secondary | ICD-10-CM | POA: Diagnosis not present

## 2021-12-15 DIAGNOSIS — Z6827 Body mass index (BMI) 27.0-27.9, adult: Secondary | ICD-10-CM | POA: Diagnosis not present

## 2021-12-15 DIAGNOSIS — E785 Hyperlipidemia, unspecified: Secondary | ICD-10-CM | POA: Diagnosis not present

## 2021-12-15 DIAGNOSIS — E663 Overweight: Secondary | ICD-10-CM | POA: Diagnosis not present

## 2021-12-26 ENCOUNTER — Encounter: Payer: Self-pay | Admitting: Family Medicine

## 2021-12-26 ENCOUNTER — Ambulatory Visit: Payer: Medicare PPO | Admitting: Family Medicine

## 2021-12-26 VITALS — BP 130/80 | Ht 61.5 in | Wt 144.0 lb

## 2021-12-26 DIAGNOSIS — M12812 Other specific arthropathies, not elsewhere classified, left shoulder: Secondary | ICD-10-CM | POA: Diagnosis not present

## 2021-12-26 NOTE — Progress Notes (Signed)
°  Faith Webb - 81 y.o. female MRN [id number]  Date of birth: October 11, 1941  SUBJECTIVE:  Including CC & ROS.  No chief complaint on file.   Faith Webb is a 81 y.o. female that is presenting with acute on chronic left shoulder pain.  She has tried different steroid injections as well as medications.  The steroid injection worked for about 3 weeks.  The pain has returned and causing her pain if she sleeps on the affected side.  No radicular pain.    Review of Systems See HPI   HISTORY: Past Medical, Surgical, Social, and Family History Reviewed & Updated per EMR.   Pertinent Historical Findings include:  Past Medical History:  Diagnosis Date   Allergy    Arthritis    Hyperlipidemia    Hypertension     Past Surgical History:  Procedure Laterality Date   ABDOMINAL HYSTERECTOMY     BREAST BIOPSY Bilateral    BREAST SURGERY     EYE SURGERY       PHYSICAL EXAM:  VS: BP 130/80 (BP Location: Left Arm, Patient Position: Sitting)    Ht 5' 1.5" (1.562 m)    Wt 144 lb (65.3 kg)    BMI 26.77 kg/m  Physical Exam Gen: NAD, alert, cooperative with exam, well-appearing MSK:  Neurovascularly intact       ASSESSMENT & PLAN:   Rotator cuff arthropathy of left shoulder Acute on chronic in nature.  Has worsening of her left shoulder pain.  We have tried medications and steroid injections.  She has completed greater than 6 weeks of home exercise therapy that was physician directed starting 08/15/2021.  Previous x-ray was unrevealing. -Counseled on home exercise therapy and supportive care. -Referral to physical therapy. -MRI of the left shoulder to evaluate for rotator cuff tear and consideration of surgical planning.

## 2021-12-26 NOTE — Patient Instructions (Signed)
Good to see you Please try naproxen  Please use ice as needed  Please try physical therapy  Please call [phone] to schedule the MRI   Please send me a message in MyChart with any questions or updates.  We'll schedule a virtual visit once the MRI is resulted.   --Dr. Jordan Likes

## 2021-12-27 NOTE — Assessment & Plan Note (Signed)
Acute on chronic in nature.  Has worsening of her left shoulder pain.  We have tried medications and steroid injections.  She has completed greater than 6 weeks of home exercise therapy that was physician directed starting 08/15/2021.  Previous x-ray was unrevealing. -Counseled on home exercise therapy and supportive care. -Referral to physical therapy. -MRI of the left shoulder to evaluate for rotator cuff tear and consideration of surgical planning.

## 2022-01-11 ENCOUNTER — Encounter: Payer: Self-pay | Admitting: Physical Therapy

## 2022-01-11 ENCOUNTER — Ambulatory Visit: Payer: Medicare PPO | Attending: Family Medicine | Admitting: Physical Therapy

## 2022-01-11 ENCOUNTER — Other Ambulatory Visit: Payer: Self-pay

## 2022-01-11 DIAGNOSIS — M6281 Muscle weakness (generalized): Secondary | ICD-10-CM | POA: Diagnosis not present

## 2022-01-11 DIAGNOSIS — R293 Abnormal posture: Secondary | ICD-10-CM | POA: Insufficient documentation

## 2022-01-11 DIAGNOSIS — G8929 Other chronic pain: Secondary | ICD-10-CM | POA: Diagnosis not present

## 2022-01-11 DIAGNOSIS — M25612 Stiffness of left shoulder, not elsewhere classified: Secondary | ICD-10-CM | POA: Diagnosis not present

## 2022-01-11 DIAGNOSIS — M12812 Other specific arthropathies, not elsewhere classified, left shoulder: Secondary | ICD-10-CM | POA: Insufficient documentation

## 2022-01-11 DIAGNOSIS — M25512 Pain in left shoulder: Secondary | ICD-10-CM | POA: Diagnosis not present

## 2022-01-11 NOTE — Patient Instructions (Signed)
° ° °  Access Code: [email or web address] URL: [email or web address] Date: 01/11/2022 Prepared by: Annie Paras  Exercises Seated Gentle Upper Trapezius Stretch (Mirrored) - 2-3 x daily - 7 x weekly - 3 reps - 30 sec hold Gentle Levator Scapulae Stretch - 2-3 x daily - 7 x weekly - 3 reps - 30 sec hold Seated Scapular Retraction - 3 x daily - 7 x weekly - 2 sets - 10 reps - 5 sec hold

## 2022-01-11 NOTE — Therapy (Signed)
[facility] [address]  [address] [place], [place], [address] Phone: [phone]   Fax:  [phone]  Physical Therapy Evaluation  Patient Details  Name: Faith Webb MRN: [id number] Date of Birth: Aug 21, 1941 Referring Provider (Faith Webb): Clare Gandy, MD   Encounter Date: 01/11/2022   Faith Webb End of Session - 01/11/22 1055     Visit Number 1    Number of Visits 13    Date for Faith Webb Re-Evaluation 02/22/22    Authorization Type [employer]    Faith Webb Start Time 1055    Faith Webb Stop Time 1150    Faith Webb Time Calculation (min) 55 min    Activity Tolerance Patient tolerated treatment well    Behavior During Therapy [facility] for tasks assessed/performed             Past Medical History:  Diagnosis Date   Allergy    Arthritis    Hyperlipidemia    Hypertension     Past Surgical History:  Procedure Laterality Date   ABDOMINAL HYSTERECTOMY     BREAST BIOPSY Bilateral    BREAST SURGERY     EYE SURGERY      There were no vitals filed for this visit.    Subjective Assessment - 01/11/22 1100     Subjective Faith Webb reports onset of pain in her L shoulder >6 mos ago w/o known MOI. Pain now also down into her upper arm. Pain mostly with activity/movement or at night when trying to sleep (prefers to sleep on L side) as well as with bathing. Did have ~1 mo relief from cortisone injection on 09/19/22 before pain returned.    Limitations Lifting;House hold activities    Diagnostic tests 08/09/22 - L shoulder x-ray: No acute fracture or dislocation. Mild acromioclavicular joint space narrowing.  08/15/22 - Limited US L shoulder: Synovitis of the left shoulder.  L shoulder MRI pending.    Patient Stated Goals "pain free"    Currently in Pain? Yes    Pain Score 5    on average; 0/10 at rest but up to 8-10/10 with certain movements   Pain Location Shoulder   & upper arm   Pain Orientation Left;Lateral    Pain Type Chronic pain    Pain Radiating Towards n/a     Pain Onset More than a month ago   >6 mos   Pain Frequency Intermittent    Aggravating Factors  bathing, lifting her arm    Pain Relieving Factors "not a whole lot of what I have taken or done"; did have ~1 mo relief from cortisone injection before pain returned    Effect of Pain on Daily Activities difficulty with bathing and dressing, unable to raise arm or lift things with her L arm                [facility] Faith Webb Assessment - 01/11/22 1055       Assessment   Medical Diagnosis L shoulder rotator cuff arthropathy    Referring Provider (Faith Webb) Clare Gandy, MD    Onset Date/Surgical Date --   >6 mo   Next MD Visit TBD following L shoulder MRI    Prior Therapy Faith Webb for vertigo      Precautions   Precautions None      Restrictions   Weight Bearing Restrictions No      Balance Screen   Has the patient fallen in the past 6 months No    Has the patient had a  decrease in activity level because of a fear of falling?  No    Is the patient reluctant to leave their home because of a fear of falling?  No      Home Environment   Living Environment Private residence    Living Arrangements Alone    Type of Home House      Prior Function   Level of Independence Independent    Vocation Retired    Leisure watching TV, exercise programs 3x/wk at [employer] in [place]      Observation/Other Assessments   Focus on Therapeutic Outcomes (FOTO)  Shoulder = 50; predicted D/C FS = 63      Posture/Postural Control   Posture/Postural Control Postural limitations    Postural Limitations Forward head;Rounded Shoulders    Posture Comments L shoulder and scapula elevated and protracted      ROM / Strength   AROM / PROM / Strength AROM;PROM;Strength      AROM   AROM Assessment Site Shoulder    Right/Left Shoulder Right;Left    Right Shoulder Extension 60 Degrees    Right Shoulder Flexion 136 Degrees    Right Shoulder ABduction 134 Degrees    Right Shoulder Internal Rotation --   FIR to T12    Right Shoulder External Rotation --   FER to T1   Left Shoulder Extension 41 Degrees    Left Shoulder Flexion 110 Degrees    Left Shoulder ABduction 72 Degrees    Left Shoulder Internal Rotation 42 Degrees   FIR to L5   Left Shoulder External Rotation 39 Degrees   FER to C7     PROM   PROM Assessment Site Shoulder    Right/Left Shoulder Left    Left Shoulder Flexion 126 Degrees    Left Shoulder ABduction 77 Degrees    Left Shoulder Internal Rotation 58 Degrees    Left Shoulder External Rotation 48 Degrees      Strength   Overall Strength Comments MMT within available ROM for L shoulder - pain with all resisted movements on L    Strength Assessment Site Shoulder    Right/Left Shoulder Right;Left    Right Shoulder Flexion 4/5    Right Shoulder Extension 4+/5    Right Shoulder ABduction 4+/5    Right Shoulder Internal Rotation 4+/5    Right Shoulder External Rotation 4/5    Left Shoulder Flexion 3+/5    Left Shoulder Extension 4-/5    Left Shoulder ABduction 3+/5    Left Shoulder Internal Rotation 4/5    Left Shoulder External Rotation 4-/5      Palpation   Palpation comment Increased muscle tension/taut bands and TTP in L anterior and lateral detoids, UT, LS, pecs, teres group, infra & supraspinatus                        Objective measurements completed on examination: See above findings.       [facility] Adult Faith Webb Treatment/Exercise - 01/11/22 1055       Exercises   Exercises Shoulder      Neck Exercises: Stretches   Upper Trapezius Stretch Left;2 reps;30 seconds    Levator Stretch Left;2 reps;30 seconds      Shoulder Exercises: Seated   Retraction Both;10 reps;AROM;Strengthening    Retraction Limitations scap retraction + depression                     Faith Webb Education - 01/11/22 1148  Education Details Faith Webb eval findings, anticipated POC and initial HEP - Access Code: [email or web address]    Person(s) Educated Patient    Methods  Explanation;Demonstration;Verbal cues;Tactile cues;Handout    Comprehension Verbalized understanding;Verbal cues required;Tactile cues required;Returned demonstration;Need further instruction              Faith Webb Short Term Goals - 01/11/22 1150       Faith Webb SHORT TERM GOAL #1   Title Patient will be independent with initial HEP    Status New    Target Date 01/25/22               Faith Webb Long Term Goals - 01/11/22 1150       Faith Webb LONG TERM GOAL #1   Title Patient will be independent with ongoing/advanced HEP for self-management at home    Status New    Target Date 02/22/22      Faith Webb LONG TERM GOAL #2   Title Improve posture and alignment with patient to demonstrate improved upright posture with posterior shoulder girdle engaged    Status New    Target Date 02/22/22      Faith Webb LONG TERM GOAL #3   Title Decrease pain in the L shoulder girdle by 50-75% allowing patient to use L UE for ADLs and functional activities    Status New    Target Date 02/22/22      Faith Webb LONG TERM GOAL #4   Title Patient to improve L shoulder AROM to [facility] without pain provocation    Status New    Target Date 02/22/22      Faith Webb LONG TERM GOAL #5   Title Patient will demonstrate improved L shoulder strength to >/= 4/5 for functional UE use    Status New    Target Date 02/22/22      Faith Webb LONG TERM GOAL #6   Title Patient will report no sleep disturbance due to L shoulder pain    Status New    Target Date 02/22/22      Faith Webb LONG TERM GOAL #7   Title Patient to report ability to perform ADLs, household, and leisure activities without limitation due to L shoulder pain, LOM or weakness    Status New    Target Date 02/22/22                    Plan - 01/11/22 1150     Clinical Impression Statement Faith Webb is an 81 y/o female who presents to OP Faith Webb for chronic L shoulder/upper arm pain secondary to RTC arthropathy. She believes the onset of pain was sudden but cannot recall a specific MOI or the specific time of  onset, other than to state it was at least 6 months ago. She often has no pain at rest unless attempting to sleep on her L side (preferred sleeping position) but notes pain with all motions of L shoulder and difficulty with ADLs (bathing and dressing) and lifting things with her L hand due to pain in the L shoulder and upper arm. Deficits include abnormal posture with L shoulder and scapula elevated and protracted and B shoulders rounded forward with forward head, limited and painful L shoulder A/PROM in all planes, increased muscle tension with tender/taut bands and TPs throughout L shoulder complex, limited L shoulder strength, and limited positional tolerance and functional use of L UE. Faith Webb will benefit from skilled Faith Webb to address above deficits, improve posture and restore pain-free functional L shoulder ROM and strength to improve tolerance  for ADLs and normal daily activities.    Personal Factors and Comorbidities Comorbidity 3+;Past/Current Experience;Time since onset of injury/illness/exacerbation    Comorbidities HTN, OA, osteopenia, non-ruptured cerebral aneurysm, vertigo/dizziness    Examination-Activity Limitations Bathing;Dressing;Hygiene/Grooming;Lift;Carry;Reach Overhead    Examination-Participation Restrictions Cleaning;Community Activity;Laundry;Meal Prep;Shop    Stability/Clinical Decision Making Stable/Uncomplicated    Clinical Decision Making Low    Rehab Potential Good    Faith Webb Frequency 2x / week    Faith Webb Duration 6 weeks    Faith Webb Treatment/Interventions ADLs/Self Care Home Management;Cryotherapy;Electrical Stimulation;Iontophoresis 4mg /ml Dexamethasone;Moist Heat;Ultrasound;Functional mobility training;Therapeutic activities;Therapeutic exercise;Neuromuscular re-education;Patient/family education;Manual techniques;Passive range of motion;Dry needling;Taping;Vasopneumatic Device    Faith Webb Next Visit Plan Review initial HEP; progress postural flexibility and strengthening to promote neutral  shoulder alignment; MT to address abnormal muscle tension and improve GH mobility; L shoulder AAROM as tolerated; modalities PRN    Faith Webb Home Exercise Plan Access Code: [email or web address]    Consulted and Agree with Plan of Care Patient             Patient will benefit from skilled therapeutic intervention in order to improve the following deficits and impairments:  Decreased activity tolerance, Decreased range of motion, Decreased strength, Increased fascial restricitons, Increased muscle spasms, Impaired perceived functional ability, Impaired flexibility, Impaired UE functional use, Improper body mechanics, Postural dysfunction, Pain  Visit Diagnosis: Chronic left shoulder pain  Stiffness of left shoulder, not elsewhere classified  Abnormal posture  Muscle weakness (generalized)     Problem List Patient Active Problem List   Diagnosis Date Noted   Rotator cuff arthropathy of left shoulder 08/11/2021   Pes anserine bursitis 05/09/2021   Urinary frequency 12/27/2020   Hypomagnesemia 12/27/2020   Nonruptured cerebral aneurysm 01/28/2020   Subclinical hyperthyroidism 01/28/2020   Otitis externa 01/28/2020   Bilateral impacted cerumen 01/28/2020   Palpitations 05/09/2019   Dizziness 05/09/2019   Hypertension    Hyperlipidemia     Faith Webb, Faith Webb 01/11/2022, 12:26 PM  [facility] [place] [address]  [address] [place], [place], [address] Phone: [phone]   Fax:  [phone]  Name: Faith Webb MRN: [id number] Date of Birth: 23-Apr-1941

## 2022-01-15 DIAGNOSIS — E663 Overweight: Secondary | ICD-10-CM | POA: Diagnosis not present

## 2022-01-15 DIAGNOSIS — E785 Hyperlipidemia, unspecified: Secondary | ICD-10-CM | POA: Diagnosis not present

## 2022-01-15 DIAGNOSIS — Z0001 Encounter for general adult medical examination with abnormal findings: Secondary | ICD-10-CM | POA: Diagnosis not present

## 2022-01-15 DIAGNOSIS — R42 Dizziness and giddiness: Secondary | ICD-10-CM | POA: Diagnosis not present

## 2022-01-15 DIAGNOSIS — Z136 Encounter for screening for cardiovascular disorders: Secondary | ICD-10-CM | POA: Diagnosis not present

## 2022-01-15 DIAGNOSIS — M12812 Other specific arthropathies, not elsewhere classified, left shoulder: Secondary | ICD-10-CM | POA: Diagnosis not present

## 2022-01-15 DIAGNOSIS — I10 Essential (primary) hypertension: Secondary | ICD-10-CM | POA: Diagnosis not present

## 2022-01-15 DIAGNOSIS — K219 Gastro-esophageal reflux disease without esophagitis: Secondary | ICD-10-CM | POA: Diagnosis not present

## 2022-01-15 DIAGNOSIS — E559 Vitamin D deficiency, unspecified: Secondary | ICD-10-CM | POA: Diagnosis not present

## 2022-01-15 DIAGNOSIS — Z79899 Other long term (current) drug therapy: Secondary | ICD-10-CM | POA: Diagnosis not present

## 2022-01-16 ENCOUNTER — Other Ambulatory Visit: Payer: Self-pay

## 2022-01-16 ENCOUNTER — Ambulatory Visit: Payer: Medicare PPO

## 2022-01-16 DIAGNOSIS — M25512 Pain in left shoulder: Secondary | ICD-10-CM | POA: Diagnosis not present

## 2022-01-16 DIAGNOSIS — M6281 Muscle weakness (generalized): Secondary | ICD-10-CM

## 2022-01-16 DIAGNOSIS — M12812 Other specific arthropathies, not elsewhere classified, left shoulder: Secondary | ICD-10-CM | POA: Diagnosis not present

## 2022-01-16 DIAGNOSIS — M25612 Stiffness of left shoulder, not elsewhere classified: Secondary | ICD-10-CM

## 2022-01-16 DIAGNOSIS — G8929 Other chronic pain: Secondary | ICD-10-CM | POA: Diagnosis not present

## 2022-01-16 DIAGNOSIS — R293 Abnormal posture: Secondary | ICD-10-CM | POA: Diagnosis not present

## 2022-01-16 NOTE — Therapy (Signed)
[facility] [address]  [address] [place], [place], [address] Phone: [phone]   Fax:  [phone]  Physical Therapy Treatment  Patient Details  Name: Faith Webb MRN: [id number] Date of Birth: Oct 16, 1941 Referring Provider (PT): Clare Gandy, MD   Encounter Date: 01/16/2022   PT End of Session - 01/16/22 1527     Visit Number 2    Number of Visits 13    Date for PT Re-Evaluation 02/22/22    Authorization Type [employer]    PT Start Time 1438    PT Stop Time 1524    PT Time Calculation (min) 46 min    Activity Tolerance Patient tolerated treatment well    Behavior During Therapy [facility] for tasks assessed/performed             Past Medical History:  Diagnosis Date   Allergy    Arthritis    Hyperlipidemia    Hypertension     Past Surgical History:  Procedure Laterality Date   ABDOMINAL HYSTERECTOMY     BREAST BIOPSY Bilateral    BREAST SURGERY     EYE SURGERY      There were no vitals filed for this visit.   Subjective Assessment - 01/16/22 1441     Subjective Pt reports not really doing exercises at home, trying to find what exercises don't hurt.    Diagnostic tests 08/09/22 - L shoulder x-ray: No acute fracture or dislocation. Mild acromioclavicular joint space narrowing.  08/15/22 - Limited US L shoulder: Synovitis of the left shoulder.  L shoulder MRI pending.    Patient Stated Goals "pain free"    Currently in Pain? Yes    Pain Score 5     Pain Location Shoulder    Pain Orientation Left;Lateral    Pain Descriptors / Indicators Aching    Pain Type Chronic pain                               [facility] Adult PT Treatment/Exercise - 01/16/22 0001       Neck Exercises: Stretches   Upper Trapezius Stretch Left;1 rep;30 seconds    Upper Trapezius Stretch Limitations cues not to go into painful ROM    Levator Stretch Left;1 rep;30 seconds    Levator Stretch Limitations cues to  avoid painful ROM      Shoulder Exercises: Seated   Retraction Both;10 reps;AROM;Strengthening    Retraction Limitations scap retraction + depression      Shoulder Exercises: Standing   External Rotation Strengthening;Left;10 reps;Theraband    Theraband Level (Shoulder External Rotation) Level 2 (Red)    External Rotation Limitations isometric step out    Internal Rotation Strengthening;Left;10 reps;Theraband    Theraband Level (Shoulder Internal Rotation) Level 2 (Red)    Flexion Strengthening;Both;10 reps    Flexion Limitations I's    ABduction Strengthening;Both;10 reps    ABduction Limitations Y's    Extension Strengthening;Both;10 reps;Theraband    Theraband Level (Shoulder Extension) Level 2 (Red)    Row Strengthening;Both;Theraband;20 reps    Theraband Level (Shoulder Row) Level 2 (Red)      Shoulder Exercises: ROM/Strengthening   UBE (Upper Arm Bike) L1.0 3 min fwd/ 3 min back      Manual Therapy   Manual Therapy Soft tissue mobilization    Soft tissue mobilization STM to L biceps, infraspinatus, rhomboids, UT  PT Education - 01/16/22 1528     Education Details HEP update    Person(s) Educated Patient    Methods Explanation;Demonstration;Tactile cues;Verbal cues;Handout    Comprehension Verbalized understanding;Returned demonstration;Verbal cues required;Tactile cues required              PT Short Term Goals - 01/16/22 1529       PT SHORT TERM GOAL #1   Title Patient will be independent with initial HEP    Status On-going    Target Date 01/25/22               PT Long Term Goals - 01/16/22 1529       PT LONG TERM GOAL #1   Title Patient will be independent with ongoing/advanced HEP for self-management at home    Status On-going    Target Date 02/22/22      PT LONG TERM GOAL #2   Title Improve posture and alignment with patient to demonstrate improved upright posture with posterior shoulder girdle engaged     Status On-going    Target Date 02/22/22      PT LONG TERM GOAL #3   Title Decrease pain in the L shoulder girdle by 50-75% allowing patient to use L UE for ADLs and functional activities    Status On-going    Target Date 02/22/22      PT LONG TERM GOAL #4   Title Patient to improve L shoulder AROM to [facility] without pain provocation    Status On-going    Target Date 02/22/22      PT LONG TERM GOAL #5   Title Patient will demonstrate improved L shoulder strength to >/= 4/5 for functional UE use    Status On-going    Target Date 02/22/22      PT LONG TERM GOAL #6   Title Patient will report no sleep disturbance due to L shoulder pain    Status On-going    Target Date 02/22/22      PT LONG TERM GOAL #7   Title Patient to report ability to perform ADLs, household, and leisure activities without limitation due to L shoulder pain, LOM or weakness    Status On-going    Target Date 02/22/22                   Plan - 01/16/22 1528     Clinical Impression Statement Pt responded well to treatment today. Updated strengthening HEP. Provided cues with exercises for proper performance and to prevent compensation. Cues needed with rows to prevent elbow flexion. Cues needed with ER step out to keep shoulder at neutral. Pt did have tenderness in the rhomboids, biceps, and teres group today.    Personal Factors and Comorbidities Comorbidity 3+;Past/Current Experience;Time since onset of injury/illness/exacerbation    Comorbidities HTN, OA, osteopenia, non-ruptured cerebral aneurysm, vertigo/dizziness    PT Frequency 2x / week    PT Duration 6 weeks    PT Treatment/Interventions ADLs/Self Care Home Management;Cryotherapy;Electrical Stimulation;Iontophoresis 4mg /ml Dexamethasone;Moist Heat;Ultrasound;Functional mobility training;Therapeutic activities;Therapeutic exercise;Neuromuscular re-education;Patient/family education;Manual techniques;Passive range of motion;Dry  needling;Taping;Vasopneumatic Device    PT Next Visit Plan progress postural flexibility and strengthening to promote neutral shoulder alignment; MT to address abnormal muscle tension and improve GH mobility; L shoulder AAROM as tolerated; modalities PRN    PT Home Exercise Plan Access Code: [email or web address]    Consulted and Agree with Plan of Care Patient             Patient will benefit  from skilled therapeutic intervention in order to improve the following deficits and impairments:  Decreased activity tolerance, Decreased range of motion, Decreased strength, Increased fascial restricitons, Increased muscle spasms, Impaired perceived functional ability, Impaired flexibility, Impaired UE functional use, Improper body mechanics, Postural dysfunction, Pain  Visit Diagnosis: Chronic left shoulder pain  Stiffness of left shoulder, not elsewhere classified  Abnormal posture  Muscle weakness (generalized)     Problem List Patient Active Problem List   Diagnosis Date Noted   Rotator cuff arthropathy of left shoulder 08/11/2021   Pes anserine bursitis 05/09/2021   Urinary frequency 12/27/2020   Hypomagnesemia 12/27/2020   Nonruptured cerebral aneurysm 01/28/2020   Subclinical hyperthyroidism 01/28/2020   Otitis externa 01/28/2020   Bilateral impacted cerumen 01/28/2020   Palpitations 05/09/2019   Dizziness 05/09/2019   Hypertension    Hyperlipidemia     Darleene Cleaver, PTA 01/16/2022, 3:30 PM  [facility] [address]  [address] [place], [place], [address] Phone: [phone]   Fax:  [phone]  Name: Faith Webb MRN: [id number] Date of Birth: 12-Mar-1941

## 2022-01-16 NOTE — Patient Instructions (Signed)
Access Code: [email or web address] URL: [email or web address] Date: 01/16/2022 Prepared by: Verta Ellen  Exercises Seated Gentle Upper Trapezius Stretch (Mirrored) - 2-3 x daily - 7 x weekly - 3 reps - 30 sec hold Gentle Levator Scapulae Stretch - 2-3 x daily - 7 x weekly - 3 reps - 30 sec hold Seated Scapular Retraction - 3 x daily - 7 x weekly - 2 sets - 10 reps - 5 sec hold Standing Bilateral Low Shoulder Row with Anchored Resistance - 1 x daily - 3 x weekly - 3 sets - 10 reps Shoulder extension with resistance - Neutral - 1 x daily - 3 x weekly - 3 sets - 10 reps

## 2022-01-18 ENCOUNTER — Encounter: Payer: Medicare PPO | Admitting: Physical Therapy

## 2022-01-19 ENCOUNTER — Encounter: Payer: Medicare PPO | Admitting: Family Medicine

## 2022-01-20 ENCOUNTER — Other Ambulatory Visit: Payer: Self-pay | Admitting: Family Medicine

## 2022-01-20 DIAGNOSIS — I10 Essential (primary) hypertension: Secondary | ICD-10-CM

## 2022-01-20 DIAGNOSIS — E785 Hyperlipidemia, unspecified: Secondary | ICD-10-CM

## 2022-01-23 ENCOUNTER — Other Ambulatory Visit: Payer: Self-pay

## 2022-01-23 ENCOUNTER — Ambulatory Visit: Payer: Medicare PPO

## 2022-01-23 DIAGNOSIS — M25612 Stiffness of left shoulder, not elsewhere classified: Secondary | ICD-10-CM

## 2022-01-23 DIAGNOSIS — M25512 Pain in left shoulder: Secondary | ICD-10-CM | POA: Diagnosis not present

## 2022-01-23 DIAGNOSIS — R293 Abnormal posture: Secondary | ICD-10-CM

## 2022-01-23 DIAGNOSIS — M6281 Muscle weakness (generalized): Secondary | ICD-10-CM | POA: Diagnosis not present

## 2022-01-23 DIAGNOSIS — M12812 Other specific arthropathies, not elsewhere classified, left shoulder: Secondary | ICD-10-CM | POA: Diagnosis not present

## 2022-01-23 DIAGNOSIS — G8929 Other chronic pain: Secondary | ICD-10-CM

## 2022-01-23 NOTE — Patient Instructions (Signed)
Access Code: [email or web address] URL: [email or web address] Date: 01/23/2022 Prepared by: Verta Ellen  Exercises Seated Gentle Upper Trapezius Stretch (Mirrored) - 2-3 x daily - 7 x weekly - 3 reps - 30 sec hold Gentle Levator Scapulae Stretch - 2-3 x daily - 7 x weekly - 3 reps - 30 sec hold Seated Scapular Retraction - 3 x daily - 7 x weekly - 2 sets - 10 reps - 5 sec hold Standing Bilateral Low Shoulder Row with Anchored Resistance - 1 x daily - 3 x weekly - 3 sets - 10 reps Shoulder extension with resistance - Neutral - 1 x daily - 3 x weekly - 3 sets - 10 reps Shoulder External Rotation with Anchored Resistance - 1 x daily - 3 x weekly - 3 sets - 10 reps Standing Shoulder Scaption - 1 x daily - 3 x weekly - 3 sets - 10 reps

## 2022-01-23 NOTE — Therapy (Signed)
[facility] [address]  [address] [place], [place], [address] Phone: [phone]   Fax:  [phone]  Physical Therapy Treatment  Patient Details  Name: Faith Webb MRN: [id number] Date of Birth: 03-20-1941 Referring Provider (PT): Clare Gandy, MD   Encounter Date: 01/23/2022   PT End of Session - 01/23/22 1359     Visit Number 3    Number of Visits 13    Date for PT Re-Evaluation 02/22/22    Authorization Type [employer]    Authorization Time Period 01/11/22-02/22/22    Authorization - Visit Number 3    Authorization - Number of Visits 12    PT Start Time 1312    PT Stop Time 1357    PT Time Calculation (min) 45 min    Activity Tolerance Patient tolerated treatment well    Behavior During Therapy [facility] for tasks assessed/performed             Past Medical History:  Diagnosis Date   Allergy    Arthritis    Hyperlipidemia    Hypertension     Past Surgical History:  Procedure Laterality Date   ABDOMINAL HYSTERECTOMY     BREAST BIOPSY Bilateral    BREAST SURGERY     EYE SURGERY      There were no vitals filed for this visit.   Subjective Assessment - 01/23/22 1316     Subjective Pt still reports pain in L shoulder, mostly at night.    Patient is accompained by: Family member    Diagnostic tests 08/09/22 - L shoulder x-ray: No acute fracture or dislocation. Mild acromioclavicular joint space narrowing.  08/15/22 - Limited US L shoulder: Synovitis of the left shoulder.  L shoulder MRI pending.    Patient Stated Goals "pain free"    Currently in Pain? Yes    Pain Score 5     Pain Location Shoulder    Pain Orientation Left;Lateral    Pain Descriptors / Indicators Aching    Pain Type Chronic pain                               [facility] Adult PT Treatment/Exercise - 01/23/22 0001       Shoulder Exercises: Supine   Other Supine Exercises CW/CCW circles with L shoulder in 90 deg  flexion 2x10 each      Shoulder Exercises: Sidelying   External Rotation AROM;Strengthening;Left;10 reps;Weights    External Rotation Weight (lbs) 1    External Rotation Limitations 1 set no weight, 2nd progressed to 1lb      Shoulder Exercises: Standing   External Rotation Strengthening;Left;10 reps;Theraband    Theraband Level (Shoulder External Rotation) Level 2 (Red)    External Rotation Limitations con/ecc    Internal Rotation Strengthening;Left;Theraband;20 reps    Theraband Level (Shoulder Internal Rotation) Level 2 (Red)    Flexion Strengthening;Both;10 reps;Weights    Shoulder Flexion Weight (lbs) 1    Extension Strengthening;Both;10 reps;Theraband    Theraband Level (Shoulder Extension) Level 2 (Red)    Extension Limitations reviewed for HEP    Row Strengthening;Both;10 reps;Theraband    Theraband Level (Shoulder Row) Level 2 (Red)    Row Limitations reviewed for HEP      Shoulder Exercises: ROM/Strengthening   UBE (Upper Arm Bike) L1.0 3 min fwd/ 3 min back      Manual Therapy   Manual Therapy Soft tissue mobilization  Soft tissue mobilization STM to L biceps proximal attachment, anterior deltoid, UT                       PT Short Term Goals - 01/16/22 1529       PT SHORT TERM GOAL #1   Title Patient will be independent with initial HEP    Status On-going    Target Date 01/25/22               PT Long Term Goals - 01/16/22 1529       PT LONG TERM GOAL #1   Title Patient will be independent with ongoing/advanced HEP for self-management at home    Status On-going    Target Date 02/22/22      PT LONG TERM GOAL #2   Title Improve posture and alignment with patient to demonstrate improved upright posture with posterior shoulder girdle engaged    Status On-going    Target Date 02/22/22      PT LONG TERM GOAL #3   Title Decrease pain in the L shoulder girdle by 50-75% allowing patient to use L UE for ADLs and functional activities     Status On-going    Target Date 02/22/22      PT LONG TERM GOAL #4   Title Patient to improve L shoulder AROM to [facility] without pain provocation    Status On-going    Target Date 02/22/22      PT LONG TERM GOAL #5   Title Patient will demonstrate improved L shoulder strength to >/= 4/5 for functional UE use    Status On-going    Target Date 02/22/22      PT LONG TERM GOAL #6   Title Patient will report no sleep disturbance due to L shoulder pain    Status On-going    Target Date 02/22/22      PT LONG TERM GOAL #7   Title Patient to report ability to perform ADLs, household, and leisure activities without limitation due to L shoulder pain, LOM or weakness    Status On-going    Target Date 02/22/22                   Plan - 01/23/22 1359     Clinical Impression Statement Pt had a good response to the treatment today. Reviewed rows and ext for HEP, added ER and standing scaption to HEP. Cues needed with rows to avoid body weight shifting. Cues during ER in standing and sidelying to keep elbow at 90 deg and to isolate forearm rotation. Otherwise pt responded well with no inceased pain.    Personal Factors and Comorbidities Comorbidity 3+;Past/Current Experience;Time since onset of injury/illness/exacerbation    Comorbidities HTN, OA, osteopenia, non-ruptured cerebral aneurysm, vertigo/dizziness    PT Frequency 2x / week    PT Duration 6 weeks    PT Treatment/Interventions ADLs/Self Care Home Management;Cryotherapy;Electrical Stimulation;Iontophoresis 4mg /ml Dexamethasone;Moist Heat;Ultrasound;Functional mobility training;Therapeutic activities;Therapeutic exercise;Neuromuscular re-education;Patient/family education;Manual techniques;Passive range of motion;Dry needling;Taping;Vasopneumatic Device    PT Next Visit Plan progress postural flexibility and strengthening to promote neutral shoulder alignment; MT to address abnormal muscle tension and improve GH mobility; L shoulder AAROM  as tolerated; modalities PRN    PT Home Exercise Plan Access Code: [email or web address]    Consulted and Agree with Plan of Care Patient             Patient will benefit from skilled therapeutic intervention in order to improve the following  deficits and impairments:  Decreased activity tolerance, Decreased range of motion, Decreased strength, Increased fascial restricitons, Increased muscle spasms, Impaired perceived functional ability, Impaired flexibility, Impaired UE functional use, Improper body mechanics, Postural dysfunction, Pain  Visit Diagnosis: Chronic left shoulder pain  Stiffness of left shoulder, not elsewhere classified  Abnormal posture  Muscle weakness (generalized)     Problem List Patient Active Problem List   Diagnosis Date Noted   Rotator cuff arthropathy of left shoulder 08/11/2021   Pes anserine bursitis 05/09/2021   Urinary frequency 12/27/2020   Hypomagnesemia 12/27/2020   Nonruptured cerebral aneurysm 01/28/2020   Subclinical hyperthyroidism 01/28/2020   Otitis externa 01/28/2020   Bilateral impacted cerumen 01/28/2020   Palpitations 05/09/2019   Dizziness 05/09/2019   Hypertension    Hyperlipidemia     Darleene Cleaver, PTA 01/23/2022, 3:06 PM  [facility] [address]  [address] [place], [place], [address] Phone: [phone]   Fax:  [phone]  Name: Faith Webb MRN: [id number] Date of Birth: 02/07/1941

## 2022-01-24 NOTE — Progress Notes (Deleted)
? ?NEUROLOGY FOLLOW UP OFFICE NOTE ? ?SANAZ KUCHINSKY ?[id number] ? ?Assessment/Plan:  ? ?1.  Dizziness, vague.  No clear neurologic etiology. ?2.  2 mm left paraophthalmic artery aneurysm, incidental finding.  Stable. ?3.  Mild episodic headache, likely tension-type, manageable.  Recent headache aggravated by cervical myofascial pain which has resolved. ?  ?1.  I would repeat MRA of head in one year to ensure stability. ?2.  Follow up after repeat testing. ? ?Subjective:  ?Faith Webb is a 81 year old female with hypertension, hyperlipidemia, subclinical hyperthyroidism and arthritis who follows up for dizziness, headache and small paraophthalmic artery aneurysm. ?  ?UPDATE: ?01/23/2021 MRA HEAD personally reviewed:  unchanged 28mm left periophthalmic aneurysm ? ?*** ?  ?HISTORY: ?She reports episodes of dizziness since around 2019.  She has history of palpitations that have previously resolved but then started to recur last month.  She describes it as mostly lightheadedness, sometimes with trace sensation of movement.  She has associated blurred vision.  No nausea, vomiting, double vision, facial droop or numbness or weakness.  It lasts 5 to 10 minutes.  It occurs daily.  It usually occurs in the morning or standing in the kitchen preparing meals.  It is not aggravated by movement.  Turning off the light helps relieve symptoms.  She was also found to have subclinical hyperthyroidism in June, with low TSH of 0.321.  She has followed up with endocrinology and is not needing antithyroid medications.  She has followed up with cardiology and is on Lopressor.  She also reports dull periorbital nonthrobbing headache when she wakes up in the morning.  It typically lasts 10 minutes.  It occurs maybe once a week to once every other week.  It is separate from the dizziness.   ?  ?08/07/2019 MRI/MRA HEAD:  2 mm left paraophthalmic artery aneurysm but otherwise unremarkable imaging of brain and intracranial vessels. ?02/06/2020 MRA  HEAD:  stable 2 mm left paraophthalmic aneurysm with no other changes. ?  ?Current NSAIDs:  ASA PRN ?Current analgesics:  none ?Current antihypertensive:  Lopressor 25mg  twice daily; HCTZ ?Current antihistamine:  Meclizine ?Current vitamins/supplements:  Magnesium oxide 420mg  daily ?Current antihistamine/decongestant:  Xyzal ? ?PAST MEDICAL HISTORY: ?Past Medical History:  ?Diagnosis Date  ? Allergy   ? Arthritis   ? Hyperlipidemia   ? Hypertension   ? ? ?MEDICATIONS: ?Current Outpatient Medications on File Prior to Visit  ?Medication Sig Dispense Refill  ? COVID-19 mRNA bivalent vaccine, Pfizer, (PFIZER COVID-19 VAC BIVALENT) injection Inject into the muscle. 0.3 mL 0  ? hydrochlorothiazide (HYDRODIURIL) 25 MG tablet TAKE 1 TABLET(25 MG) BY MOUTH DAILY 90 tablet 1  ? influenza vaccine adjuvanted (FLUAD) 0.5 ML injection Inject into the muscle. 0.5 mL 0  ? Magnesium Oxide 420 MG TABS Take 0.5 tablets by mouth daily.     ? metoprolol tartrate (LOPRESSOR) 25 MG tablet TAKE 1 TABLET(25 MG) BY MOUTH TWICE DAILY 180 tablet 1  ? naproxen (NAPROSYN) 500 MG tablet Take 1 tablet (500 mg total) by mouth 2 (two) times daily with a meal. 60 tablet 1  ? Omega-3 Fatty Acids (THE VERY FINEST FISH OIL) LIQD Take by mouth.    ? omeprazole (PRILOSEC) 20 MG capsule Take 20 mg by mouth daily as needed.    ? potassium chloride (KLOR-CON) 10 MEQ tablet TAKE 3 TABLETS(30 MEQ) BY MOUTH TWICE DAILY 270 tablet 1  ? predniSONE (DELTASONE) 10 MG tablet TAKE 3 TABLETS PO QD FOR 3 DAYS THEN TAKE 2 TABLETS  PO QD FOR 3 DAYS THEN TAKE 1 TABLET PO QD FOR 3 DAYS THEN TAKE 1/2 TAB PO QD FOR 3 DAYS (Patient not taking: Reported on 01/11/2022) 20 tablet 0  ? rosuvastatin (CRESTOR) 10 MG tablet TAKE 1 TABLET(10 MG) BY MOUTH AT BEDTIME 90 tablet 1  ? Vitamin D, Ergocalciferol, (DRISDOL) 1.25 MG (50000 UNIT) CAPS capsule TAKE 1 CAPSULE BY MOUTH EVERY WEEK (Patient taking differently: 1,000 Units daily.) 12 capsule 3  ? ?No current facility-administered  medications on file prior to visit.  ? ? ?ALLERGIES: ?Allergies  ?Allergen Reactions  ? Amoxicillin Palpitations  ? ? ?FAMILY HISTORY: ?Family History  ?Problem Relation Age of Onset  ? Diabetes Mother   ? Heart disease Mother   ? Hypertension Mother   ? Stroke Mother   ? Diabetes Father   ? Cancer Sister   ? Diabetes Sister   ? Diabetes Brother   ? Hyperlipidemia Brother   ? Stroke Brother   ? Diabetes Sister   ? Hyperlipidemia Sister   ? Hypertension Sister   ? Kidney disease Sister   ? Stroke Sister   ? Diabetes Sister   ? Hyperlipidemia Sister   ? Healthy Child   ? ? ?  ?Objective:  ?*** ?General: No acute distress.  Patient appears ***-groomed.   ?Head:  Normocephalic/atraumatic ?Eyes:  Fundi examined but not visualized ?Neck: supple, no paraspinal tenderness, full range of motion ?Heart:  Regular rate and rhythm ?Lungs:  Clear to auscultation bilaterally ?Back: No paraspinal tenderness ?Neurological Exam: alert and oriented to person, place, and time.  Speech fluent and not dysarthric, language intact.  CN II-XII intact. Bulk and tone normal, muscle strength 5/5 throughout.  Sensation to light touch intact.  Deep tendon reflexes 2+ throughout, toes downgoing.  Finger to nose testing intact.  Gait normal, Romberg negative. ? ? ?Metta Clines, DO ? ?CC: *** ? ? ? ? ? ? ?

## 2022-01-25 ENCOUNTER — Ambulatory Visit: Payer: Medicare PPO | Admitting: Neurology

## 2022-01-25 ENCOUNTER — Other Ambulatory Visit: Payer: Self-pay

## 2022-01-25 ENCOUNTER — Ambulatory Visit: Payer: Medicare PPO | Attending: Family Medicine | Admitting: Physical Therapy

## 2022-01-25 ENCOUNTER — Encounter: Payer: Self-pay | Admitting: Physical Therapy

## 2022-01-25 DIAGNOSIS — G8929 Other chronic pain: Secondary | ICD-10-CM | POA: Insufficient documentation

## 2022-01-25 DIAGNOSIS — M25512 Pain in left shoulder: Secondary | ICD-10-CM | POA: Insufficient documentation

## 2022-01-25 DIAGNOSIS — M25612 Stiffness of left shoulder, not elsewhere classified: Secondary | ICD-10-CM | POA: Insufficient documentation

## 2022-01-25 DIAGNOSIS — R293 Abnormal posture: Secondary | ICD-10-CM | POA: Diagnosis not present

## 2022-01-25 DIAGNOSIS — M6281 Muscle weakness (generalized): Secondary | ICD-10-CM | POA: Insufficient documentation

## 2022-01-25 NOTE — Therapy (Signed)
[facility] [address]  [place], [place], [address] Phone: [phone]   Fax:  [phone]  Physical Therapy Treatment  Patient Details  Name: Faith Webb MRN: [id number] Date of Birth: 1941-02-11 Referring Provider (PT): Clearance Coots, MD   Encounter Date: 01/25/2022   PT End of Session - 01/25/22 0848     Visit Number 4    Number of Visits 13    Date for PT Re-Evaluation 02/22/22    Authorization Type [employer]    Authorization Time Period 01/11/22 - 02/22/22    Authorization - Visit Number 3   corrected - eval not counted toward visit count   Authorization - Number of Visits 12    PT Start Time 0848    PT Stop Time 0930    PT Time Calculation (min) 42 min    Activity Tolerance Patient tolerated treatment well    Behavior During Therapy [facility] for tasks assessed/performed             Past Medical History:  Diagnosis Date   Allergy    Arthritis    Hyperlipidemia    Hypertension     Past Surgical History:  Procedure Laterality Date   ABDOMINAL HYSTERECTOMY     BREAST BIOPSY Bilateral    BREAST SURGERY     EYE SURGERY      There were no vitals filed for this visit.   Subjective Assessment - 01/25/22 0852     Subjective Pt still reports continued pain in L shoulder, mostly at night, but able to get some relief with Icy-Hot uasually only 1x day but yesterday needed to use it 2x.    Patient is accompained by: Family member    Diagnostic tests 08/09/22 - L shoulder x-ray: No acute fracture or dislocation. Mild acromioclavicular joint space narrowing.  08/15/22 - Limited US L shoulder: Synovitis of the left shoulder.  L shoulder MRI pending.    Patient Stated Goals "pain free"    Currently in Pain? Yes    Pain Score 5     Pain Location Shoulder    Pain Orientation Left;Lateral    Pain Descriptors / Indicators Aching    Pain Type Chronic pain    Pain Frequency Intermittent                 [facility] PT Assessment - 01/25/22 0848       AROM   Left Shoulder Extension 45 Degrees    Left Shoulder Flexion 123 Degrees    Left Shoulder ABduction 85 Degrees    Left Shoulder Internal Rotation 63 Degrees    Left Shoulder External Rotation 56 Degrees                           [facility] Adult PT Treatment/Exercise - 01/25/22 0848       Shoulder Exercises: Seated   Flexion Both;10 reps;AROM;Strengthening    Flexion Limitations I's    Abduction Both;10 reps;AROM;Strengthening    ABduction Limitations Y's (scaption)      Shoulder Exercises: Standing   External Rotation Left;10 reps;Strengthening;Theraband    Theraband Level (Shoulder External Rotation) Level 2 (Red)    External Rotation Limitations con/ecc with towel roll under arm & cues for scap retraction    Extension Both;10 reps;Strengthening;Theraband   2 sets   Theraband Level (Shoulder Extension) Level 2 (Red)    Extension Limitations cues to slow pace and increase hold  time as well as for awareness of scap retraction and depression, avoiding shoulder shrug    Row Both;10 reps;Strengthening;Theraband   2 sets   Theraband Level (Shoulder Row) Level 2 (Red)    Row Limitations cues to slow pace and increase hold time as well as for awareness of scap retraction and depression, avoiding shoulder shrug    Other Standing Exercises Red TB "W" row 10 x 3"      Shoulder Exercises: Therapy Ball   Flexion Both;10 reps    Flexion Limitations seated green Pball rollout    Scaption Left;10 reps    Scaption Limitations seated green Pball rollout      Shoulder Exercises: ROM/Strengthening   UBE (Upper Arm Bike) L1.5 x 6 min (3 min each fwd & back)                       PT Short Term Goals - 01/25/22 0930       PT SHORT TERM GOAL #1   Title Patient will be independent with initial HEP    Status Partially Met   01/25/22 - continued clarification necessary   Target Date 01/25/22                PT Long Term Goals - 01/16/22 1529       PT LONG TERM GOAL #1   Title Patient will be independent with ongoing/advanced HEP for self-management at home    Status On-going    Target Date 02/22/22      PT LONG TERM GOAL #2   Title Improve posture and alignment with patient to demonstrate improved upright posture with posterior shoulder girdle engaged    Status On-going    Target Date 02/22/22      PT LONG TERM GOAL #3   Title Decrease pain in the L shoulder girdle by 50-75% allowing patient to use L UE for ADLs and functional activities    Status On-going    Target Date 02/22/22      PT LONG TERM GOAL #4   Title Patient to improve L shoulder AROM to [facility] without pain provocation    Status On-going    Target Date 02/22/22      PT LONG TERM GOAL #5   Title Patient will demonstrate improved L shoulder strength to >/= 4/5 for functional UE use    Status On-going    Target Date 02/22/22      PT LONG TERM GOAL #6   Title Patient will report no sleep disturbance due to L shoulder pain    Status On-going    Target Date 02/22/22      PT LONG TERM GOAL #7   Title Patient to report ability to perform ADLs, household, and leisure activities without limitation due to L shoulder pain, LOM or weakness    Status On-going    Target Date 02/22/22                   Plan - 01/25/22 0930     Clinical Impression Statement Faith Webb reports limited performance of HEP due to being busy with other things. Upon review she required cueing for pacing and proper movement patterns with TB resisted exercises to promote better muscle engagement, postural awareness and GH motion. Reinforced need for compliance with HEP as prescribed to prepare for exercise/activity advancement during therapy sessions - pt verbalizing understanding and intent to try to be more consistent with HE. L shoulder ROM improving but still  limited as compared to R.    Comorbidities HTN, OA, osteopenia, non-ruptured  cerebral aneurysm, vertigo/dizziness    Rehab Potential Good    PT Frequency 2x / week    PT Duration 6 weeks    PT Treatment/Interventions ADLs/Self Care Home Management;Cryotherapy;Electrical Stimulation;Iontophoresis 57m/ml Dexamethasone;Moist Heat;Ultrasound;Functional mobility training;Therapeutic activities;Therapeutic exercise;Neuromuscular re-education;Patient/family education;Manual techniques;Passive range of motion;Dry needling;Taping;Vasopneumatic Device    PT Next Visit Plan progress postural flexibility and strengthening to promote neutral shoulder alignment; MT to address abnormal muscle tension and improve GH mobility; L shoulder AAROM as tolerated; modalities PRN    PT Home Exercise Plan Access Code: [email or web address]    Consulted and Agree with Plan of Care Patient             Patient will benefit from skilled therapeutic intervention in order to improve the following deficits and impairments:  Decreased activity tolerance, Decreased range of motion, Decreased strength, Increased fascial restricitons, Increased muscle spasms, Impaired perceived functional ability, Impaired flexibility, Impaired UE functional use, Improper body mechanics, Postural dysfunction, Pain  Visit Diagnosis: Chronic left shoulder pain  Stiffness of left shoulder, not elsewhere classified  Abnormal posture  Muscle weakness (generalized)     Problem List Patient Active Problem List   Diagnosis Date Noted   Rotator cuff arthropathy of left shoulder 08/11/2021   Pes anserine bursitis 05/09/2021   Urinary frequency 12/27/2020   Hypomagnesemia 12/27/2020   Nonruptured cerebral aneurysm 01/28/2020   Subclinical hyperthyroidism 01/28/2020   Otitis externa 01/28/2020   Bilateral impacted cerumen 01/28/2020   Palpitations 05/09/2019   Dizziness 05/09/2019   Hypertension    Hyperlipidemia     JPercival Spanish PT 01/25/2022, 12:47 PM  [facility] [address] [place] [address]Phone: [phone]  Fax:  [phone] Name: Faith SCHEERERMRN: [id number] of Birth: 205-12-1940

## 2022-01-31 ENCOUNTER — Telehealth: Payer: Medicare PPO | Admitting: Neurology

## 2022-02-02 ENCOUNTER — Ambulatory Visit
Admission: RE | Admit: 2022-02-02 | Discharge: 2022-02-02 | Disposition: A | Discharge status: Home / Self Care | Payer: Medicare PPO | Source: Ambulatory Visit | Attending: Family Medicine | Admitting: Family Medicine

## 2022-02-02 DIAGNOSIS — M19012 Primary osteoarthritis, left shoulder: Secondary | ICD-10-CM | POA: Diagnosis not present

## 2022-02-02 DIAGNOSIS — M25412 Effusion, left shoulder: Secondary | ICD-10-CM | POA: Diagnosis not present

## 2022-02-02 DIAGNOSIS — M12812 Other specific arthropathies, not elsewhere classified, left shoulder: Secondary | ICD-10-CM

## 2022-02-02 DIAGNOSIS — M75122 Complete rotator cuff tear or rupture of left shoulder, not specified as traumatic: Secondary | ICD-10-CM | POA: Diagnosis not present

## 2022-02-06 ENCOUNTER — Ambulatory Visit: Payer: Medicare PPO | Admitting: Physical Therapy

## 2022-02-08 ENCOUNTER — Encounter: Payer: Medicare PPO | Admitting: Physical Therapy

## 2022-02-13 ENCOUNTER — Other Ambulatory Visit: Payer: Self-pay | Admitting: Family Medicine

## 2022-02-13 ENCOUNTER — Ambulatory Visit: Payer: Medicare PPO

## 2022-02-15 ENCOUNTER — Other Ambulatory Visit: Payer: Self-pay

## 2022-02-15 ENCOUNTER — Encounter: Payer: Self-pay | Admitting: Physical Therapy

## 2022-02-15 ENCOUNTER — Ambulatory Visit: Payer: Medicare PPO | Admitting: Physical Therapy

## 2022-02-15 DIAGNOSIS — M6281 Muscle weakness (generalized): Secondary | ICD-10-CM

## 2022-02-15 DIAGNOSIS — M25612 Stiffness of left shoulder, not elsewhere classified: Secondary | ICD-10-CM

## 2022-02-15 DIAGNOSIS — G8929 Other chronic pain: Secondary | ICD-10-CM

## 2022-02-15 DIAGNOSIS — R293 Abnormal posture: Secondary | ICD-10-CM | POA: Diagnosis not present

## 2022-02-15 DIAGNOSIS — M25512 Pain in left shoulder: Secondary | ICD-10-CM | POA: Diagnosis not present

## 2022-02-15 NOTE — Therapy (Signed)
[facility] [place] ?[address] ?[place], [place], [address] ?Phone: [phone]   Fax:  [phone] ? ?Physical Therapy Treatment ? ?Patient Details  ?Name: Faith Webb ?MRN: [id number] ?Date of Birth: [birth year] ?Referring Provider (PT): Clearance Coots, MD ? ? ?Encounter Date: 02/15/2022 ? ? PT End of Session - 02/15/22 1101   ? ? Visit Number 5   ? Number of Visits 13   ? Date for PT Re-Evaluation 02/22/22   ? Authorization Type [employer]   ? Authorization Time Period 01/11/22 - 02/22/22   ? Authorization - Visit Number 5   ? Authorization - Number of Visits 12   ? PT Start Time 1101   ? PT Stop Time 1145   ? PT Time Calculation (min) 44 min   ? Activity Tolerance Patient tolerated treatment well   ? Behavior During Therapy [facility] for tasks assessed/performed   ? ?  ?  ? ?  ? ? ?Past Medical History:  ?Diagnosis Date  ? Allergy   ? Arthritis   ? Hyperlipidemia   ? Hypertension   ? ? ?Past Surgical History:  ?Procedure Laterality Date  ? ABDOMINAL HYSTERECTOMY    ? BREAST BIOPSY Bilateral   ? BREAST SURGERY    ? EYE SURGERY    ? ? ?There were no vitals filed for this visit. ? ? Subjective Assessment - 02/15/22 1103   ? ? Subjective Pt reports she has been away due her sister's death. Had an MRI but has not had the results yet due to being away.   ? Diagnostic tests 08/09/21 - L shoulder x-ray: No acute fracture or dislocation. Mild acromioclavicular joint space narrowing.  08/15/21 - Limited US L shoulder: Synovitis of the left shoulder.  02/02/22 - L shoulder MRI:  1. 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 Moderate  supraspinatus and mild infraspinatus muscle atrophy suggests at  least a component of these tears is chronic.  2. Mild proximal long head of the biceps tendinosis.  3. Mild-to-moderate degenerative changes of the acromioclavicular  joint.   ? Patient Stated Goals "pain  free"   ? Currently in Pain? Yes   ? Pain Score 5    ? Pain Location Shoulder   ? Pain Orientation Left;Upper;Lateral   ? Pain Descriptors / Indicators Aching   "locking"  ? Pain Type Chronic pain   ? Pain Frequency Intermittent   ? ?  ?  ? ?  ? ? ? ? ? [facility] PT Assessment - 02/15/22 1101   ? ?  ? Assessment  ? Medical Diagnosis L shoulder rotator cuff arthropathy   ? Referring Provider (PT) Clearance Coots, MD   ? Onset Date/Surgical Date --   >6 mo  ? Next MD Visit TBD   ?  ? AROM  ? Left Shoulder Flexion 91 Degrees   ? Left Shoulder ABduction 77 Degrees   ? Left Shoulder Internal Rotation 50 Degrees   ? Left Shoulder External Rotation 43 Degrees   ?  ? PROM  ? Left Shoulder Flexion 103 Degrees   ? Left Shoulder ABduction 67 Degrees   ? Left Shoulder Internal Rotation 51 Degrees   ? Left Shoulder External Rotation 48 Degrees   ? ?  ?  ? ?  ? ? ? ? ? ? ? ? ? ? ? ? ? ? ? ? [facility] Adult PT Treatment/Exercise -  02/15/22 1101   ? ?  ? Neck Exercises: Stretches  ? Upper Trapezius Stretch Left;2 reps;30 seconds   ? Levator Stretch Left;2 reps;30 seconds   ?  ? Shoulder Exercises: Seated  ? Retraction Both;10 reps;AROM;Strengthening   ? Retraction Limitations scap retraction + depression   ?  ? Shoulder Exercises: Standing  ? ABduction Both;10 reps;AROM;Strengthening   ? ABduction Limitations Y's   ? Extension Both;10 reps;Strengthening;Theraband   2 sets  ? Theraband Level (Shoulder Extension) Level 2 (Red)   ? Extension Limitations cues to slow pace and increase hold time as well as for awareness of scap retraction and depression, avoiding shoulder shrug   ? Row Both;10 reps;Strengthening;Theraband   2 sets  ? Theraband Level (Shoulder Row) Level 2 (Red)   ? Row Limitations cues to slow pace and increase hold time as well as for awareness of scap retraction and depression, avoiding shoulder shrug   ?  ? Shoulder Exercises: ROM/Strengthening  ? UBE (Upper Arm Bike) L2.0 x 6 min (3 min each fwd & back)   ?  ? Manual Therapy   ? Manual Therapy Soft tissue mobilization;Myofascial release;Passive ROM   ? Soft tissue mobilization STM to L UT, deltoids and biceps/triceps   ? Myofascial Release manual TPR to L UT   ? Passive ROM L shoulder - all planes to tolerance (limited by pain and guarding but soft end feel)   ? ?  ?  ? ?  ? ? ? ? ? ? ? ? ? ? ? ? PT Short Term Goals - 02/15/22 1145   ? ?  ? PT SHORT TERM GOAL #1  ? Title Patient will be independent with initial HEP   ? Status Partially Met   02/15/22 - pt reports better understanding following review  ? Target Date 01/25/22   ? ?  ?  ? ?  ? ? ? ? PT Long Term Goals - 02/15/22 1151   ? ?  ? PT LONG TERM GOAL #1  ? Title Patient will be independent with ongoing/advanced HEP for self-management at home   ? Status On-going   ? Target Date 02/22/22   ?  ? PT LONG TERM GOAL #2  ? Title Improve posture and alignment with patient to demonstrate improved upright posture with posterior shoulder girdle engaged   ? Status On-going   ? Target Date 02/22/22   ?  ? PT LONG TERM GOAL #3  ? Title Decrease pain in the L shoulder girdle by 50-75% allowing patient to use L UE for ADLs and functional activities   ? Status On-going   ? Target Date 02/22/22   ?  ? PT LONG TERM GOAL #4  ? Title Patient to improve L shoulder AROM to [facility] without pain provocation   ? Status On-going   ? Target Date 02/22/22   ?  ? PT LONG TERM GOAL #5  ? Title Patient will demonstrate improved L shoulder strength to >/= 4/5 for functional UE use   ? Status On-going   ? Target Date 02/22/22   ?  ? PT LONG TERM GOAL #6  ? Title Patient will report no sleep disturbance due to L shoulder pain   ? Status On-going   ? Target Date 02/22/22   ?  ? PT LONG TERM GOAL #7  ? Title Patient to report ability to perform ADLs, household, and leisure activities without limitation due to L shoulder pain, LOM or weakness   ?  Status On-going   ? Target Date 02/22/22   ? ?  ?  ? ?  ? ? ? ? ? ? ? ? Plan - 02/15/22 1145   ? ? Clinical Impression  Statement Faith Webb returns to PT after extended absence due to the death of her sister. She reports limited opportunity to work on her HEP while traveling, therefore HEP reviewed for clarification at pt request. Cues necessary to ensure desired movement patterns and clarify muscle activation vs relaxation as well as hold times for stretches vs strengthening exercises - pt reports better understanding following review. MT addressing c/o of increased tightness/tenderness in L UT and lateral biceps/triceps. L shoulder A/PROM has declined during her absence from PT. Faith Webb is nearing the end of her current POC but has yet to discuss the results of her MRI with the MD to determine further medical vs surgical management - she will try to schedule an appointment with the referring MD to help determine whether we will proceed with conservative management and extend PT POC vs await surgical consult.   ? Comorbidities HTN, OA, osteopenia, non-ruptured cerebral aneurysm, vertigo/dizziness   ? Rehab Potential Good   ? PT Frequency 2x / week   ? PT Duration 6 weeks   ? PT Treatment/Interventions ADLs/Self Care Home Management;Cryotherapy;Electrical Stimulation;Iontophoresis 8m/ml Dexamethasone;Moist Heat;Ultrasound;Functional mobility training;Therapeutic activities;Therapeutic exercise;Neuromuscular re-education;Patient/family education;Manual techniques;Passive range of motion;Dry needling;Taping;Vasopneumatic Device   ? PT Next Visit Plan progress postural flexibility and strengthening to promote neutral shoulder alignment; MT to address abnormal muscle tension and improve GH mobility; L shoulder AAROM as tolerated; modalities PRN   ? PT Home Exercise Plan Access Code: [email or web address]   ? Consulted and Agree with Plan of Care Patient   ? ?  ?  ? ?  ? ? ?Patient will benefit from skilled therapeutic intervention in order to improve the following deficits and impairments:  Decreased activity tolerance, Decreased range of motion,  Decreased strength, Increased fascial restricitons, Increased muscle spasms, Impaired perceived functional ability, Impaired flexibility, Impaired UE functional use, Improper body mechanics, Postural dysfunction, Pain

## 2022-02-16 ENCOUNTER — Telehealth (INDEPENDENT_AMBULATORY_CARE_PROVIDER_SITE_OTHER): Payer: Medicare PPO | Admitting: Family Medicine

## 2022-02-16 ENCOUNTER — Encounter: Payer: Self-pay | Admitting: Family Medicine

## 2022-02-16 DIAGNOSIS — M12812 Other specific arthropathies, not elsewhere classified, left shoulder: Secondary | ICD-10-CM

## 2022-02-16 NOTE — Progress Notes (Signed)
Virtual Visit via Video Note ? ?I connected with Faith Webb on 02/16/22 at  8:00 AM EDT by a video enabled telemedicine application and verified that I am speaking with the correct person using two identifiers. ? ?Location: ?Patient: home ?Provider: office ?  ?I discussed the limitations of evaluation and management by telemedicine and the availability of in person appointments. The patient expressed understanding and agreed to proceed. ? ?History of Present Illness: ? ?Faith Webb is a 81 year old female that is following up after the MRI of her left shoulder.  This was demonstrating a large full-thickness tear of the entire supraspinatus and 50% of the infraspinatus footprint.  This was showing muscle atrophy of these 2 as well. ?  ?Observations/Objective: ? ? ?Assessment and Plan: ? ?Supraspinatus and infraspinatus rotator cuff tear: ?MRI was revealing for a full-thickness supraspinatus tear and 50% of the infraspinatus.  There is has a degree of being chronic in nature With atrophy change. ?-Counseled on home exercise therapy and supportive care. ?-Continue physical therapy. ?-Pursue shockwave therapy. ?-Can perform injections as needed. ?-Discussed surgery and would likely need replacement.  May proceed that if these endeavors fail. ? ?Follow Up Instructions: ? ?  ?I discussed the assessment and treatment plan with the patient. The patient was provided an opportunity to ask questions and all were answered. The patient agreed with the plan and demonstrated an understanding of the instructions. ?  ?The patient was advised to call back or seek an in-person evaluation if the symptoms worsen or if the condition fails to improve as anticipated. ? ? ? ?Faith Gandy, MD ? ? ?

## 2022-02-16 NOTE — Assessment & Plan Note (Signed)
MRI was revealing for a full-thickness supraspinatus tear and 50% of the infraspinatus.  There is has a degree of being chronic in nature With atrophy change. ?-Counseled on home exercise therapy and supportive care. ?-Continue physical therapy. ?-Pursue shockwave therapy. ?-Can perform injections as needed. ?-Discussed surgery and would likely need replacement.  May proceed that if these endeavors fail. ?

## 2022-02-20 ENCOUNTER — Ambulatory Visit: Payer: Medicare PPO

## 2022-02-20 ENCOUNTER — Other Ambulatory Visit: Payer: Self-pay

## 2022-02-20 DIAGNOSIS — M6281 Muscle weakness (generalized): Secondary | ICD-10-CM

## 2022-02-20 DIAGNOSIS — G8929 Other chronic pain: Secondary | ICD-10-CM

## 2022-02-20 DIAGNOSIS — M25612 Stiffness of left shoulder, not elsewhere classified: Secondary | ICD-10-CM

## 2022-02-20 DIAGNOSIS — R293 Abnormal posture: Secondary | ICD-10-CM | POA: Diagnosis not present

## 2022-02-20 DIAGNOSIS — M25512 Pain in left shoulder: Secondary | ICD-10-CM | POA: Diagnosis not present

## 2022-02-20 NOTE — Therapy (Signed)
[facility] [place] ?[address]  [address] ?[place], [place], [address] ?Phone: [phone]   Fax:  [phone] ? ?Physical Therapy Treatment ? ?Patient Details  ?Name: Faith Webb ?MRN: [id number] ?Date of Birth: 11/20/1941 ?Referring Provider (PT): Jeremy Schmitz, MD ? ? ?Encounter Date: 02/20/2022 ? ? PT End of Session - 02/20/22 1149   ? ? Visit Number 6   ? Number of Visits 13   ? Date for PT Re-Evaluation 02/22/22   ? Authorization Type [employer]   ? Authorization Time Period 01/11/22 - 02/22/22   ? Authorization - Visit Number 6   ? Authorization - Number of Visits 12   ? PT Start Time 1103   ? PT Stop Time 1145   ? PT Time Calculation (min) 42 min   ? Activity Tolerance Patient tolerated treatment well   ? Behavior During Therapy [facility] for tasks assessed/performed   ? ?  ?  ? ?  ? ? ?Past Medical History:  ?Diagnosis Date  ? Allergy   ? Arthritis   ? Hyperlipidemia   ? Hypertension   ? ? ?Past Surgical History:  ?Procedure Laterality Date  ? ABDOMINAL HYSTERECTOMY    ? BREAST BIOPSY Bilateral   ? BREAST SURGERY    ? EYE SURGERY    ? ? ?There were no vitals filed for this visit. ? ? Subjective Assessment - 02/20/22 1105   ? ? Subjective Pt reports off and on pain, had a chance to review MRI results and consulted with MD on options going forward.   ? Diagnostic tests 08/09/21 - L shoulder x-ray: No acute fracture or dislocation. Mild acromioclavicular joint space narrowing.  08/15/21 - Limited US L shoulder: Synovitis of the left shoulder.  02/02/22 - L shoulder MRI:  1. 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 Moderate  supraspinatus and mild infraspinatus muscle atrophy suggests at  least a component of these tears is chronic.  2. Mild proximal long head of the biceps tendinosis.  3. Mild-to-moderate degenerative changes of the acromioclavicular  joint.   ? Patient Stated Goals "pain  free"   ? Currently in Pain? Yes   ? Pain Score 4    when reaching [place]  ? Pain Location Shoulder   ? Pain Orientation Left   ? Pain Descriptors / Indicators Aching   ? Pain Type Chronic pain   ? ?  ?  ? ?  ? ? ? ? ? ? ? ? ? ? ? ? ? ? ? ? ? ? ? ? [facility] Adult PT Treatment/Exercise - 02/20/22 0001   ? ?  ? Shoulder Exercises: Sidelying  ? External Rotation Strengthening;Left;10 reps;Weights   2 sets  ? External Rotation Weight (lbs) 2   ? ABduction AROM;Left;10 reps   2 sets  ?  ? Shoulder Exercises: Standing  ? External Rotation Left;10 reps;Strengthening;Theraband   ? Theraband Level (Shoulder External Rotation) Level 3 (Green)   ? Internal Rotation Strengthening;Left;15 reps;Theraband   ? Theraband Level (Shoulder Internal Rotation) Level 3 (Green)   ? Internal Rotation Limitations towel to side   ? Extension Both;10 reps;Strengthening;Theraband   ? Theraband Level (Shoulder Extension) Level 3 (Green)   ? Row Both;10 reps;Strengthening;Theraband   ? Theraband Level (Shoulder Row) Level 3 (Green)   ? Other Standing Exercises B ER with GTB x 10   ?  ? Shoulder Exercises: ROM/Strengthening  ?   UBE (Upper Arm Bike) L2.0 x 6 min (3 min each fwd & back)   ? Lat Pull Limitations 20lb 2x10   ? Cybex Row Limitations 20lb 2x10   ? Pushups 10 reps   ? Pushups Limitations on counter, small ROM   ? Other ROM/Strengthening Exercises serratus slide on wall with foam roll 2 x 10   ? ?  ?  ? ?  ? ? ? ? ? ? ? ? ? ? ? ? PT Short Term Goals - 02/15/22 1145   ? ?  ? PT SHORT TERM GOAL #1  ? Title Patient will be independent with initial HEP   ? Status Partially Met   02/15/22 - pt reports better understanding following review  ? Target Date 01/25/22   ? ?  ?  ? ?  ? ? ? ? PT Long Term Goals - 02/20/22 1111   ? ?  ? PT LONG TERM GOAL #1  ? Title Patient will be independent with ongoing/advanced HEP for self-management at home   ? Status On-going   ? Target Date 02/22/22   ?  ? PT LONG TERM GOAL #2  ? Title Improve posture and alignment with  patient to demonstrate improved upright posture with posterior shoulder girdle engaged   ? Status On-going   ? Target Date 02/22/22   ?  ? PT LONG TERM GOAL #3  ? Title Decrease pain in the L shoulder girdle by 50-75% allowing patient to use L UE for ADLs and functional activities   ? Status On-going   ? Target Date 02/22/22   ?  ? PT LONG TERM GOAL #4  ? Title Patient to improve L shoulder AROM to [facility] without pain provocation   ? Status On-going   ? Target Date 02/22/22   ?  ? PT LONG TERM GOAL #5  ? Title Patient will demonstrate improved L shoulder strength to >/= 4/5 for functional UE use   ? Status On-going   ? Target Date 02/22/22   ?  ? PT LONG TERM GOAL #6  ? Title Patient will report no sleep disturbance due to L shoulder pain   ? Status Partially Met   pt reports now she does muscle rubs on her shoulder which take away pain at night  ? Target Date 02/22/22   ?  ? PT LONG TERM GOAL #7  ? Title Patient to report ability to perform ADLs, household, and leisure activities without limitation due to L shoulder pain, LOM or weakness   ? Status On-going   ? Target Date 02/22/22   ? ?  ?  ? ?  ? ? ? ? ? ? ? ? Plan - 02/20/22 1150   ? ? Clinical Impression Statement Pt has now recieved MRI results and consulted with Dr. Raeford Razor about options going forward and she wishes to continue with conservative PT. Cuing with the RTC exercises to keep elbow to side and to keep 90 deg flexion at elbow to isolate RTC. Pt tolerated weight machines well but instruction required with seated rows to keep upright posture to avoid trunk extension. At this point pt reported no disturbance of sleep from L shld pain but also that she uses muscle rubs before bed - goal partially met. Pt will need recert next visit.   ? Personal Factors and Comorbidities Comorbidity 3+;Past/Current Experience;Time since onset of injury/illness/exacerbation   ? Comorbidities HTN, OA, osteopenia, non-ruptured cerebral aneurysm, vertigo/dizziness   ? PT  Frequency  2x / week   ? PT Duration 6 weeks   ? PT Treatment/Interventions ADLs/Self Care Home Management;Cryotherapy;Electrical Stimulation;Iontophoresis 48m/ml Dexamethasone;Moist Heat;Ultrasound;Functional mobility training;Therapeutic activities;Therapeutic exercise;Neuromuscular re-education;Patient/family education;Manual techniques;Passive range of motion;Dry needling;Taping;Vasopneumatic Device   ? PT Next Visit Plan recert; progress postural flexibility and strengthening to promote neutral shoulder alignment; MT to address abnormal muscle tension and improve GH mobility; L shoulder AAROM as tolerated; modalities PRN   ? PT Home Exercise Plan Access Code: [email or web address]   ? Consulted and Agree with Plan of Care Patient   ? ?  ?  ? ?  ? ? ?Patient will benefit from skilled therapeutic intervention in order to improve the following deficits and impairments:  Decreased activity tolerance, Decreased range of motion, Decreased strength, Increased fascial restricitons, Increased muscle spasms, Impaired perceived functional ability, Impaired flexibility, Impaired UE functional use, Improper body mechanics, Postural dysfunction, Pain ? ?Visit Diagnosis: ?Chronic left shoulder pain ? ?Stiffness of left shoulder, not elsewhere classified ? ?Abnormal posture ? ?Muscle weakness (generalized) ? ? ? ? ?Problem List ?Patient Active Problem List  ? Diagnosis Date Noted  ? Rotator cuff arthropathy of left shoulder 08/11/2021  ? Pes anserine bursitis 05/09/2021  ? Urinary frequency 12/27/2020  ? Hypomagnesemia 12/27/2020  ? Nonruptured cerebral aneurysm 01/28/2020  ? Subclinical hyperthyroidism 01/28/2020  ? Otitis externa 01/28/2020  ? Bilateral impacted cerumen 01/28/2020  ? Palpitations 05/09/2019  ? Dizziness 05/09/2019  ? Hypertension   ? Hyperlipidemia   ? ? ?BArtist Pais PTA ?02/20/2022, 12:15 PM ? ?[facility] [place] ?[address]?[place]  [address]?Phone: [phone]  Fax:  [phone]? ?Name: Faith Webb?MRN: [id number]?Date of Birth: 209-21-1942? ? ? ?

## 2022-02-22 ENCOUNTER — Ambulatory Visit: Payer: Medicare PPO | Admitting: Physical Therapy

## 2022-02-22 ENCOUNTER — Encounter: Payer: Self-pay | Admitting: Physical Therapy

## 2022-02-22 ENCOUNTER — Encounter: Payer: Self-pay | Admitting: Family Medicine

## 2022-02-22 ENCOUNTER — Ambulatory Visit (INDEPENDENT_AMBULATORY_CARE_PROVIDER_SITE_OTHER): Payer: Self-pay | Admitting: Family Medicine

## 2022-02-22 DIAGNOSIS — M25612 Stiffness of left shoulder, not elsewhere classified: Secondary | ICD-10-CM

## 2022-02-22 DIAGNOSIS — M25512 Pain in left shoulder: Secondary | ICD-10-CM | POA: Diagnosis not present

## 2022-02-22 DIAGNOSIS — G8929 Other chronic pain: Secondary | ICD-10-CM | POA: Diagnosis not present

## 2022-02-22 DIAGNOSIS — R293 Abnormal posture: Secondary | ICD-10-CM | POA: Diagnosis not present

## 2022-02-22 DIAGNOSIS — M6281 Muscle weakness (generalized): Secondary | ICD-10-CM

## 2022-02-22 DIAGNOSIS — M12812 Other specific arthropathies, not elsewhere classified, left shoulder: Secondary | ICD-10-CM

## 2022-02-22 NOTE — Progress Notes (Signed)
?  Faith Webb - 81 y.o. female MRN [id number]  Date of birth: 08-21-1941 ? ?SUBJECTIVE:  Including CC & ROS.  ?No chief complaint on file. ? ? ?Faith Webb is a 81 y.o. female that is here for shockwave therapy. ? ? ? ?Review of Systems ?See HPI  ? ?HISTORY: Past Medical, Surgical, Social, and Family History Reviewed & Updated per EMR.   ?Pertinent Historical Findings include: ? ?Past Medical History:  ?Diagnosis Date  ? Allergy   ? Arthritis   ? Hyperlipidemia   ? Hypertension   ? ? ?Past Surgical History:  ?Procedure Laterality Date  ? ABDOMINAL HYSTERECTOMY    ? BREAST BIOPSY Bilateral   ? BREAST SURGERY    ? EYE SURGERY    ? ? ? ?PHYSICAL EXAM:  ?VS: Ht 5' 1.5" (1.562 m)   Wt 144 lb (65.3 kg)   BMI 26.77 kg/m?  ?Physical Exam ?Gen: NAD, alert, cooperative with exam, well-appearing ?MSK:  ?Neurovascularly intact   ? ?ECSWT Note ?Faith Webb ?09-05-1941 ? ?Procedure: ECSWT ?Indications: left shoulder pain  ? ?Procedure Details ?Consent: Risks of procedure as well as the alternatives and risks of each were explained to the (patient/caregiver).  Consent for procedure obtained. ?Time Out: Verified patient identification, verified procedure, site/side was marked, verified correct patient position, special equipment/implants available, medications/allergies/relevent history reviewed, required imaging and test results available.  Performed.  The area was cleaned with iodine and alcohol swabs.   ? ?The left shoulder  was targeted for Extracorporeal shockwave therapy.  ? ?Preset: shoulder problems  ?Power Level: 70 ?Frequency: 10 ?Impulse/cycles: 2400 ?Head size: medium   ?Session: 1st ? ?Patient did tolerate procedure well. ? ? ? ?ASSESSMENT & PLAN:  ? ?Rotator cuff arthropathy of left shoulder ?Completed shockwave therapy ? ? ? ? ?

## 2022-02-22 NOTE — Therapy (Signed)
[facility] [facility] ?[address] ?[place], [place], [address] ?Phone: [phone]   Fax:  [phone] ? ?Physical Therapy Treatment / Recert ? ?Patient Details  ?Name: Faith Webb ?MRN: [id number] ?Date of Birth: Jun 11, 1941 ?Referring Provider (PT): Clearance Coots, MD ? ? ? ?Progress Note ? ?Reporting Period 01/11/2022 to 02/22/2022 ? ?See note below for Objective Data and Assessment of Progress/Goals.  ? ? ? ?Encounter Date: 02/22/2022 ? ? PT End of Session - 02/22/22 1106   ? ? Visit Number 7   ? Number of Visits 19   ? Date for PT Re-Evaluation 04/05/22   ? Authorization Type [employer]   ? Authorization Time Period 01/11/22 - 02/22/22   ? Authorization - Visit Number 7   ? Authorization - Number of Visits 12   ? Progress Note Due on Visit 17   Recert completed on visit #7 - 02/22/22  ? PT Start Time 1106   ? PT Stop Time 1146   ? PT Time Calculation (min) 40 min   ? Activity Tolerance Patient tolerated treatment well   ? Behavior During Therapy [facility] for tasks assessed/performed   ? ?  ?  ? ?  ? ? ?Past Medical History:  ?Diagnosis Date  ? Allergy   ? Arthritis   ? Hyperlipidemia   ? Hypertension   ? ? ?Past Surgical History:  ?Procedure Laterality Date  ? ABDOMINAL HYSTERECTOMY    ? BREAST BIOPSY Bilateral   ? BREAST SURGERY    ? EYE SURGERY    ? ? ?There were no vitals filed for this visit. ? ? Subjective Assessment - 02/22/22 1108   ? ? Subjective Pt reports pain seems better today and she hasn't had to use any of the topical pain meds in a couple days. She was supposed to have her first shockwave therapy prior to her PT appt this morning but the MD office was running behind so she will be going back after her PT appt.   ? Diagnostic tests 08/09/21 - L shoulder x-ray: No acute fracture or dislocation. Mild acromioclavicular joint space narrowing.  08/15/21 - Limited US L shoulder: Synovitis of the left shoulder.  02/02/22 - L shoulder MRI:  1. 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 Moderate  supraspinatus and mild infraspinatus muscle atrophy suggests at  least a component of these tears is chronic.  2. Mild proximal long head of the biceps tendinosis.  3. Mild-to-moderate degenerative changes of the acromioclavicular  joint.   ? Patient Stated Goals "pain free"   ? Currently in Pain? Yes   ? Pain Score 4    ? Pain Location Shoulder   ? Pain Orientation Left   ? Pain Descriptors / Indicators Aching   ? Pain Type Chronic pain   ? Pain Frequency Intermittent   ? ?  ?  ? ?  ? ? ? ? ? [facility] PT Assessment - 02/22/22 1106   ? ?  ? Assessment  ? Medical Diagnosis L shoulder rotator cuff arthropathy   ? Referring Provider (PT) Clearance Coots, MD   ? Onset Date/Surgical Date --   >6 mo  ?  ? Prior Function  ? Level of Independence Independent   ? Vocation Retired   ? Leisure watching TV, exercise programs 3x/wk at [facility] in [place]   ?  ? AROM  ? Left Shoulder  Flexion 107 Degrees   ? Left Shoulder ABduction 84 Degrees   120? scaption in sidelying  ? Left Shoulder Internal Rotation 64 Degrees   ? Left Shoulder External Rotation 56 Degrees   ?  ? PROM  ? Left Shoulder Flexion 104 Degrees   ? Left Shoulder ABduction 77 Degrees   ? Left Shoulder Internal Rotation 61 Degrees   ? Left Shoulder External Rotation 60 Degrees   ? ?  ?  ? ?  ? ? ? ? ? ? ? ? ? ? ? ? ? ? ? ? [facility] Adult PT Treatment/Exercise - 02/22/22 1106   ? ?  ? Shoulder Exercises: Supine  ? Protraction Left;10 reps;AROM;Strengthening   ? Other Supine Exercises L shoudler CW/CCW circles at 90? flexion x 10 each   ?  ? Shoulder Exercises: Prone  ? Retraction Left;10 reps;Strengthening;Weights   ? Retraction Weight (lbs) 1   ? Retraction Limitations standing bent-over row, cues for scap retraction   pt  ? Extension Left;10 reps;Strengthening;Weights   ? Extension Weight (lbs) 1   ? Extension Limitations standing bent-over, cues for  scap retraction   ?  ? Shoulder Exercises: Sidelying  ? External Rotation Left;10 reps;Strengthening;Weights   ? External Rotation Weight (lbs) 1   ? ABduction Left;10 reps;AROM;Strengthening   ? ABduction Limitations 0-110? scaption   ? Other Sidelying Exercises L shoudler CW/CCW circles at 90? scaption x 10 each   ?  ? Shoulder Exercises: ROM/Strengthening  ? UBE (Upper Arm Bike) L2.0 x 6 min (3 min each fwd & back)   ? Rhythmic Stabilization, Supine 2 finger perturbations 2 x 20 sec with L arm at 90? flexion   ?  ? Manual Therapy  ? Manual Therapy Soft tissue mobilization;Myofascial release;Passive ROM   ? Soft tissue mobilization STM to L deltoids and biceps   ? Myofascial Release manual TPR to L anterior deltoid   ? Passive ROM L shoulder - all planes to tolerance (limited by pain and guarding but soft end feel)   ? ?  ?  ? ?  ? ? ? ? ? ? ? ? ? ? ? ? PT Short Term Goals - 02/22/22 1111   ? ?  ? PT SHORT TERM GOAL #1  ? Title Patient will be independent with initial HEP   ? Status Achieved   02/22/22  ? ?  ?  ? ?  ? ? ? ? PT Long Term Goals - 02/22/22 1111   ? ?  ? PT LONG TERM GOAL #1  ? Title Patient will be independent with ongoing/advanced HEP for self-management at home   ? Status On-going   ? Target Date 04/05/22   ?  ? PT LONG TERM GOAL #2  ? Title Improve posture and alignment with patient to demonstrate improved upright posture with posterior shoulder girdle engaged   ? Status On-going   ? Target Date 04/05/22   ?  ? PT LONG TERM GOAL #3  ? Title Decrease pain in the L shoulder girdle by 50-75% allowing patient to use L UE for ADLs and functional activities   ? Status Partially Met   02/22/22 - Pt reporting ~50% improvement in pain with decreased reliance on topical pain meds  ? Target Date 04/05/22   ?  ? PT LONG TERM GOAL #4  ? Title Patient to improve L shoulder AROM to [facility] without pain provocation   ? Status On-going   ? Target Date 04/05/22   ?  ?  PT LONG TERM GOAL #5  ? Title Patient will  demonstrate improved L shoulder strength to >/= 4/5 for functional UE use   ? Status On-going   ? Target Date 04/05/22   ?  ? PT LONG TERM GOAL #6  ? Title Patient will report no sleep disturbance due to L shoulder pain   ? Status Partially Met   02/22/22 - pt reports less sleep disturbance due to pain  ? Target Date 04/05/22   ?  ? PT LONG TERM GOAL #7  ? Title Patient to report ability to perform ADLs, household, and leisure activities without limitation due to L shoulder pain, LOM or weakness   ? Status On-going   ? Target Date 04/05/22   ? ?  ?  ? ?  ? ? ? ? ? ? ? ? Plan - 02/22/22 1146   ? ? Clinical Impression Statement Vaughan Basta reports her L shoulder pain has been better over the past week with decreasing need for use of topical pain medication - she states overall pain improved by ~50% since start of PT. L shoulder ROM recovering from recent decline that she had experienced but still limited functionally in all planes. Strength gradually improving as evidenced by tolerance for increasing resistance with exercises but slow progression so as to avoid further irritating the RTC. STG met and LTGs #2 & 6 partially met. Recent MRI revealing extensive RTC tear but pt wishing to proceed with conservative treatment including continued PT rather than surgical intervention, therefore will plan for recertification to extend POC for additional 2x/wk for up to 6 wks to maximize functional L shoulder ROM and strength within available ROM.   ? Personal Factors and Comorbidities Comorbidity 3+;Past/Current Experience;Time since onset of injury/illness/exacerbation   ? Comorbidities HTN, OA, osteopenia, non-ruptured cerebral aneurysm, vertigo/dizziness   ? Examination-Activity Limitations Bathing;Dressing;Hygiene/Grooming;Lift;Carry;Reach Overhead   ? Examination-Participation Restrictions Cleaning;Community Activity;Laundry;Meal Prep;Shop   ? Rehab Potential Good   ? PT Frequency 2x / week   ? PT Duration 6 weeks   ? PT  Treatment/Interventions ADLs/Self Care Home Management;Cryotherapy;Electrical Stimulation;Iontophoresis 21m/ml Dexamethasone;Moist Heat;Ultrasound;Functional mobility training;Therapeutic activities;Therapeutic exercise;Neur

## 2022-02-22 NOTE — Assessment & Plan Note (Signed)
Completed shockwave therapy  

## 2022-02-25 ENCOUNTER — Other Ambulatory Visit: Payer: Self-pay | Admitting: Family Medicine

## 2022-02-27 ENCOUNTER — Ambulatory Visit: Payer: Medicare PPO | Attending: Family Medicine

## 2022-02-27 DIAGNOSIS — M25512 Pain in left shoulder: Secondary | ICD-10-CM | POA: Insufficient documentation

## 2022-02-27 DIAGNOSIS — G8929 Other chronic pain: Secondary | ICD-10-CM | POA: Insufficient documentation

## 2022-02-27 DIAGNOSIS — M25612 Stiffness of left shoulder, not elsewhere classified: Secondary | ICD-10-CM | POA: Insufficient documentation

## 2022-02-27 DIAGNOSIS — R293 Abnormal posture: Secondary | ICD-10-CM | POA: Insufficient documentation

## 2022-02-27 DIAGNOSIS — M6281 Muscle weakness (generalized): Secondary | ICD-10-CM | POA: Insufficient documentation

## 2022-02-27 NOTE — Therapy (Signed)
[facility] ?[address] ?[place], [place], [address] ?Phone: [phone]   Fax:  [phone] ? ?Physical Therapy Treatment ? ?Patient Details  ?Name: Faith Webb ?MRN: [id number] ?Date of Birth: 12-May-1941 ?Referring Provider (PT): Clearance Coots, MD ? ? ?Encounter Date: 02/27/2022 ? ? PT End of Session - 02/27/22 1211   ? ? Visit Number 8   ? Number of Visits 19   ? Date for PT Re-Evaluation 04/05/22   ? Authorization Type [employer]   ? Authorization Time Period 02/27/22-04/05/22   ? Authorization - Visit Number 1   ? Authorization - Number of Visits 12   ? Progress Note Due on Visit 17   Recert completed on visit #7 - 02/22/22  ? PT Start Time 1104   ? PT Stop Time 7915   ? PT Time Calculation (min) 55 min   ? Activity Tolerance Patient tolerated treatment well   ? Behavior During Therapy [facility] for tasks assessed/performed   ? ?  ?  ? ?  ? ? ?Past Medical History:  ?Diagnosis Date  ? Allergy   ? Arthritis   ? Hyperlipidemia   ? Hypertension   ? ? ?Past Surgical History:  ?Procedure Laterality Date  ? ABDOMINAL HYSTERECTOMY    ? BREAST BIOPSY Bilateral   ? BREAST SURGERY    ? EYE SURGERY    ? ? ?There were no vitals filed for this visit. ? ? Subjective Assessment - 02/27/22 1104   ? ? Subjective Pt reports not mcuh difference from shockwave therapy the next day but the following days noticed some improvement.   ? Diagnostic tests 08/09/21 - L shoulder x-ray: No acute fracture or dislocation. Mild acromioclavicular joint space narrowing.  08/15/21 - Limited US L shoulder: Synovitis of the left shoulder.  02/02/22 - L shoulder MRI:  1. 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 Moderate  supraspinatus and mild infraspinatus muscle atrophy suggests at  least a component of these tears is chronic.  2. Mild proximal long head of the biceps tendinosis.  3. Mild-to-moderate degenerative  changes of the acromioclavicular  joint.   ? Patient Stated Goals "pain free"   ? Currently in Pain? Yes   ? Pain Score 4    ? Pain Location Shoulder   ? Pain Orientation Left   ? Pain Descriptors / Indicators Aching   ? Pain Type Chronic pain   ? ?  ?  ? ?  ? ? ? ? ? ? ? ? ? ? ? ? ? ? ? ? ? ? ? ? [facility] Adult PT Treatment/Exercise - 02/27/22 0001   ? ?  ? Shoulder Exercises: Prone  ? Extension Left;10 reps;Strengthening;Weights   ? Extension Weight (lbs) 2   ?  ? Shoulder Exercises: Sidelying  ? External Rotation Left;10 reps;Strengthening;Weights   2 sets  ? External Rotation Weight (lbs) 2   ? ABduction Left;AROM;Strengthening;Weights;20 reps   ? ABduction Weight (lbs) 1   ? Other Sidelying Exercises horizontal ABD x 12   ?  ? Shoulder Exercises: Standing  ? Horizontal ABduction Strengthening;Both;10 reps;Theraband   ? Theraband Level (Shoulder Horizontal ABduction) Level 2 (Red)   ? Horizontal ABduction Limitations cues for proper movement   ? Diagonals Strengthening;Both;10 reps;Theraband   D2 flexion 2 sets, 1 set with B UE; 1 set with L UE only  ? Theraband Level (Shoulder Diagonals)  Level 2 (Red)   ? Other Standing Exercises AAROM IR and ER with cane x 10   ? Other Standing Exercises serratus slides with RTB on wall x 20   ?  ? Shoulder Exercises: ROM/Strengthening  ? UBE (Upper Arm Bike) L3.0 x 6 min (3 min each fwd & back)   ?  ? Modalities  ? Modalities Vasopneumatic   ?  ? Vasopneumatic  ? Number Minutes Vasopneumatic  10 minutes   ? Vasopnuematic Location  Shoulder   ? Vasopneumatic Pressure Low   ? Vasopneumatic Temperature  34   ? ?  ?  ? ?  ? ? ? ? ? ? ? ? ? ? ? ? PT Short Term Goals - 02/22/22 1111   ? ?  ? PT SHORT TERM GOAL #1  ? Title Patient will be independent with initial HEP   ? Status Achieved   02/22/22  ? ?  ?  ? ?  ? ? ? ? PT Long Term Goals - 02/22/22 1111   ? ?  ? PT LONG TERM GOAL #1  ? Title Patient will be independent with ongoing/advanced HEP for self-management at home   ? Status  On-going   ? Target Date 04/05/22   ?  ? PT LONG TERM GOAL #2  ? Title Improve posture and alignment with patient to demonstrate improved upright posture with posterior shoulder girdle engaged   ? Status On-going   ? Target Date 04/05/22   ?  ? PT LONG TERM GOAL #3  ? Title Decrease pain in the L shoulder girdle by 50-75% allowing patient to use L UE for ADLs and functional activities   ? Status Partially Met   02/22/22 - Pt reporting ~50% improvement in pain with decreased reliance on topical pain meds  ? Target Date 04/05/22   ?  ? PT LONG TERM GOAL #4  ? Title Patient to improve L shoulder AROM to [facility] without pain provocation   ? Status On-going   ? Target Date 04/05/22   ?  ? PT LONG TERM GOAL #5  ? Title Patient will demonstrate improved L shoulder strength to >/= 4/5 for functional UE use   ? Status On-going   ? Target Date 04/05/22   ?  ? PT LONG TERM GOAL #6  ? Title Patient will report no sleep disturbance due to L shoulder pain   ? Status Partially Met   02/22/22 - pt reports less sleep disturbance due to pain  ? Target Date 04/05/22   ?  ? PT LONG TERM GOAL #7  ? Title Patient to report ability to perform ADLs, household, and leisure activities without limitation due to L shoulder pain, LOM or weakness   ? Status On-going   ? Target Date 04/05/22   ? ?  ?  ? ?  ? ? ? ? ? ? ? ? Plan - 02/27/22 1212   ? ? Clinical Impression Statement Pt noted only a little improvement from shockwave therapy. Pt still demonstrates some discomfort with OH movements but we are still staying within the pain-free ranges. Continued to progress with shoulder strengthening but with instruction provided to avoid injury or muscle/tendon overuse. Pt demonstrated now that she is able to reach behind her head w/o pain and we worked on Special educational needs teacher for this movement. Pt responded well to treatment evident by no subjective increases in pain.   ? Personal Factors and Comorbidities Comorbidity 3+;Past/Current Experience;Time since  onset of  injury/illness/exacerbation   ? Comorbidities HTN, OA, osteopenia, non-ruptured cerebral aneurysm, vertigo/dizziness   ? PT Frequency 2x / week   ? PT Duration 6 weeks   ? PT Treatment/Interventions ADLs/Self Care Home Management;Cryotherapy;Electrical Stimulation;Iontophoresis 71m/ml Dexamethasone;Moist Heat;Ultrasound;Functional mobility training;Therapeutic activities;Therapeutic exercise;Neuromuscular re-education;Patient/family education;Manual techniques;Passive range of motion;Dry needling;Taping;Vasopneumatic Device   ? PT Next Visit Plan progress postural flexibility and strengthening to promote neutral shoulder alignment; MT to address abnormal muscle tension and improve GH mobility; L shoulder AAROM and gentle RTC strengthening as tolerated; modalities PRN   ? PT Home Exercise Plan Access Code: [email or web address]   ? Consulted and Agree with Plan of Care Patient   ? ?  ?  ? ?  ? ? ?Patient will benefit from skilled therapeutic intervention in order to improve the following deficits and impairments:  Decreased activity tolerance, Decreased range of motion, Decreased strength, Increased fascial restricitons, Increased muscle spasms, Impaired perceived functional ability, Impaired flexibility, Impaired UE functional use, Improper body mechanics, Postural dysfunction, Pain ? ?Visit Diagnosis: ?Chronic left shoulder pain ? ?Stiffness of left shoulder, not elsewhere classified ? ?Abnormal posture ? ?Muscle weakness (generalized) ? ? ? ? ?Problem List ?Patient Active Problem List  ? Diagnosis Date Noted  ? Rotator cuff arthropathy of left shoulder 08/11/2021  ? Pes anserine bursitis 05/09/2021  ? Urinary frequency 12/27/2020  ? Hypomagnesemia 12/27/2020  ? Nonruptured cerebral aneurysm 01/28/2020  ? Subclinical hyperthyroidism 01/28/2020  ? Otitis externa 01/28/2020  ? Bilateral impacted cerumen 01/28/2020  ? Palpitations 05/09/2019  ? Dizziness 05/09/2019  ? Hypertension   ? Hyperlipidemia   ? ? ?BArtist Pais PTA ?02/27/2022, 12:18 PM ? ?[place] [facility] ?[address]?[place] [address]?Phone: [phone]  Fax:  [phone]? ?Name: MShawnette

## 2022-03-01 ENCOUNTER — Encounter: Payer: Self-pay | Admitting: Family Medicine

## 2022-03-01 ENCOUNTER — Ambulatory Visit: Payer: Medicare PPO | Admitting: Physical Therapy

## 2022-03-01 ENCOUNTER — Encounter: Payer: Self-pay | Admitting: Physical Therapy

## 2022-03-01 ENCOUNTER — Ambulatory Visit (INDEPENDENT_AMBULATORY_CARE_PROVIDER_SITE_OTHER): Payer: Self-pay | Admitting: Family Medicine

## 2022-03-01 DIAGNOSIS — M25512 Pain in left shoulder: Secondary | ICD-10-CM | POA: Diagnosis not present

## 2022-03-01 DIAGNOSIS — G8929 Other chronic pain: Secondary | ICD-10-CM

## 2022-03-01 DIAGNOSIS — M6281 Muscle weakness (generalized): Secondary | ICD-10-CM | POA: Diagnosis not present

## 2022-03-01 DIAGNOSIS — M25612 Stiffness of left shoulder, not elsewhere classified: Secondary | ICD-10-CM | POA: Diagnosis not present

## 2022-03-01 DIAGNOSIS — M12812 Other specific arthropathies, not elsewhere classified, left shoulder: Secondary | ICD-10-CM

## 2022-03-01 DIAGNOSIS — R293 Abnormal posture: Secondary | ICD-10-CM | POA: Diagnosis not present

## 2022-03-01 NOTE — Assessment & Plan Note (Signed)
Completed shockwave therapy  

## 2022-03-01 NOTE — Therapy (Addendum)
[facility] [place] [address]  [place], [place], [address] Phone: [phone]   Fax:  [phone]  Physical Therapy Treatment / Discharge Summary  Patient Details  Name: Faith Webb MRN: [id number] Date of Birth: [birth year] Referring Provider (PT): Clearance Coots, MD   Encounter Date: 03/01/2022   PT End of Session - 03/01/22 1100     Visit Number 9    Number of Visits 19    Date for PT Re-Evaluation 04/05/22    Authorization Type [employer]    Authorization Time Period 02/27/22 - 04/05/22    Authorization - Visit Number 2    Authorization - Number of Visits 12    Progress Note Due on Visit 33   Recert completed on visit #7 - 02/22/22   PT Start Time 1100    PT Stop Time 1142    PT Time Calculation (min) 42 min    Activity Tolerance Patient tolerated treatment well    Behavior During Therapy [facility] for tasks assessed/performed             Past Medical History:  Diagnosis Date   Allergy    Arthritis    Hyperlipidemia    Hypertension     Past Surgical History:  Procedure Laterality Date   ABDOMINAL HYSTERECTOMY     BREAST BIOPSY Bilateral    BREAST SURGERY     EYE SURGERY      There were no vitals filed for this visit.   Subjective Assessment - 03/01/22 1104     Subjective Pt reports she will be having her 2nd shockwave treatment today - not sure if she noticed any difference following the 1st session.    Diagnostic tests 08/09/21 - L shoulder x-ray: No acute fracture or dislocation. Mild acromioclavicular joint space narrowing.  08/15/21 - Limited US L shoulder: Synovitis of the left shoulder.  02/02/22 - L shoulder MRI:  1. 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 Moderate  supraspinatus and mild infraspinatus muscle atrophy suggests at  least a component of these tears is chronic.  2. Mild proximal long head of the biceps  tendinosis.  3. Mild-to-moderate degenerative changes of the acromioclavicular  joint.    Patient Stated Goals "pain free"    Currently in Pain? No/denies                               [facility] Adult PT Treatment/Exercise - 03/01/22 1100       Neck Exercises: Stretches   Upper Trapezius Stretch Left;2 reps;30 seconds    Levator Stretch Left;2 reps;30 seconds      Shoulder Exercises: Prone   Extension Both;10 reps;AROM;Strengthening    Extension Limitations I's leaning over orange Pball on mat table    External Rotation Both;10 reps;AROM;Strengthening    External Rotation Limitations W's leaning over orange Pball on mat table    Horizontal ABduction 1 Both;10 reps;AROM;Strengthening    Horizontal ABduction 1 Limitations T's leaning over orange Pball on mat table    Horizontal ABduction 2 Both;10 reps;AROM;Strengthening    Horizontal ABduction 2 Limitations Y's leaning over orange Pball on mat table   limited ROM on L     Shoulder Exercises: ROM/Strengthening   UBE (Upper Arm Bike) L3.0 x 6 min (3 min each fwd & back)    Lat Pull Limitations 15# & 10# x  10 each - seated; 5# & 10# x 10 each - standing straight arm pull-down   increased pain reported with attempt using 20# seated   Ball on Wall L shoulder CW/CCW circles 2 x 10 at 90 flexion & scaption    Other ROM/Strengthening Exercises serratus roll-up on wall with foam roll + looped yellow TB at forearms 2 x 10    Other ROM/Strengthening Exercises R/L serratus 3-way clocks with looped YTB at wrists 2 x 5 cycles                       PT Short Term Goals - 02/22/22 1111       PT SHORT TERM GOAL #1   Title Patient will be independent with initial HEP    Status Achieved   02/22/22              PT Long Term Goals - 02/22/22 1111       PT LONG TERM GOAL #1   Title Patient will be independent with ongoing/advanced HEP for self-management at home    Status On-going    Target Date 04/05/22       PT LONG TERM GOAL #2   Title Improve posture and alignment with patient to demonstrate improved upright posture with posterior shoulder girdle engaged    Status On-going    Target Date 04/05/22      PT LONG TERM GOAL #3   Title Decrease pain in the L shoulder girdle by 50-75% allowing patient to use L UE for ADLs and functional activities    Status Partially Met   02/22/22 - Pt reporting ~50% improvement in pain with decreased reliance on topical pain meds   Target Date 04/05/22      PT LONG TERM GOAL #4   Title Patient to improve L shoulder AROM to WFL without pain provocation    Status On-going    Target Date 04/05/22      PT LONG TERM GOAL #5   Title Patient will demonstrate improved L shoulder strength to >/= 4/5 for functional UE use    Status On-going    Target Date 04/05/22      PT LONG TERM GOAL #6   Title Patient will report no sleep disturbance due to L shoulder pain    Status Partially Met   02/22/22 - pt reports less sleep disturbance due to pain   Target Date 04/05/22      PT LONG TERM GOAL #7   Title Patient to report ability to perform ADLs, household, and leisure activities without limitation due to L shoulder pain, LOM or weakness    Status On-going    Target Date 04/05/22                   Plan - 03/01/22 1107     Clinical Impression Statement [name] denies pain at rest but still notes pain with most motions of L shoulder esp end-ROM overhead. TE continuing to target scapular engagement/stabilization throughout shoulder ROM and strengthening exercises/activities with good tolerance, noting some fatigue but no increased pain. Briefly reviewed HEP with pt admitting to limited compliance at home presently but acknowledging good understanding of exercises and denying need for any modifications. Today was her last scheduled visit although MD recertification and insurance authorization approved through 04/05/22 - when encouraged to schedule more appointments, pt  stating she wants to hold off until she sees how the shockwave therapy works for her (next appt for   this is scheduled for this afternoon), after which she will call to schedule more PT appointments if she want to return to PT.    Comorbidities HTN, OA, osteopenia, non-ruptured cerebral aneurysm, vertigo/dizziness    Rehab Potential Good    PT Frequency 2x / week    PT Duration 6 weeks    PT Treatment/Interventions ADLs/Self Care Home Management;Cryotherapy;Electrical Stimulation;Iontophoresis 4mg/ml Dexamethasone;Moist Heat;Ultrasound;Functional mobility training;Therapeutic activities;Therapeutic exercise;Neuromuscular re-education;Patient/family education;Manual techniques;Passive range of motion;Dry needling;Taping;Vasopneumatic Device    PT Next Visit Plan progress postural flexibility and strengthening to promote neutral shoulder alignment; MT to address abnormal muscle tension and improve GH mobility; L shoulder AAROM and gentle RTC strengthening as tolerated; modalities PRN    PT Home Exercise Plan Access Code: [email or web address]    Consulted and Agree with Plan of Care Patient             Patient will benefit from skilled therapeutic intervention in order to improve the following deficits and impairments:  Decreased activity tolerance, Decreased range of motion, Decreased strength, Increased fascial restricitons, Increased muscle spasms, Impaired perceived functional ability, Impaired flexibility, Impaired UE functional use, Improper body mechanics, Postural dysfunction, Pain  Visit Diagnosis: Chronic left shoulder pain  Stiffness of left shoulder, not elsewhere classified  Abnormal posture  Muscle weakness (generalized)     Problem List Patient Active Problem List   Diagnosis Date Noted   Rotator cuff arthropathy of left shoulder 08/11/2021   Pes anserine bursitis 05/09/2021   Urinary frequency 12/27/2020   Hypomagnesemia 12/27/2020   Nonruptured cerebral aneurysm 01/28/2020    Subclinical hyperthyroidism 01/28/2020   Otitis externa 01/28/2020   Bilateral impacted cerumen 01/28/2020   Palpitations 05/09/2019   Dizziness 05/09/2019   Hypertension    Hyperlipidemia     JoAnne M Kreis, PT 03/01/2022, 11:53 AM  [facility] [address]  [address] [place], [place], [address] Phone: [phone]   Fax:  [phone]  Name: Kataleah L Mose MRN: [id number] Date of Birth: 09/19/1941   PHYSICAL THERAPY DISCHARGE SUMMARY  Visits from Start of Care: 9  Current functional level related to goals / functional outcomes:   Refer to above clinical impression for status as of last visit on 03/01/2022. Patient did not schedule any further visits and has not returned to PT in >30 dyas, therefore will proceed with discharge from PT for this episode.   Remaining deficits:   As above. Pt did not return to PT.   Education / Equipment:   HEP   Patient agrees to discharge. Patient goals were partially met. Patient is being discharged due to not returning since the last visit.  JoAnne M. Kreis, PT, MPT 05/03/22, 5:10 PM  [facility] [place] [address]  [address] [place], [place], [address] Phone: [phone]   Fax:  [phone]   

## 2022-03-01 NOTE — Progress Notes (Signed)
?  Faith Webb - 81 y.o. female MRN [id number]  Date of birth: 1941-01-16 ? ?SUBJECTIVE:  Including CC & ROS.  ?No chief complaint on file. ? ? ?Faith Webb is a 81 y.o. female that is here for shockwave therapy. ? ? ? ?Review of Systems ?See HPI  ? ?HISTORY: Past Medical, Surgical, Social, and Family History Reviewed & Updated per EMR.   ?Pertinent Historical Findings include: ? ?Past Medical History:  ?Diagnosis Date  ? Allergy   ? Arthritis   ? Hyperlipidemia   ? Hypertension   ? ? ?Past Surgical History:  ?Procedure Laterality Date  ? ABDOMINAL HYSTERECTOMY    ? BREAST BIOPSY Bilateral   ? BREAST SURGERY    ? EYE SURGERY    ? ? ? ?PHYSICAL EXAM:  ?VS: Ht 5' 1.5" (1.562 m)   Wt 144 lb (65.3 kg)   BMI 26.77 kg/m?  ?Physical Exam ?Gen: NAD, alert, cooperative with exam, well-appearing ?MSK:  ?Neurovascularly intact   ? ?ECSWT Note ?Faith Webb ?December 14, 1940 ? ?Procedure: ECSWT ?Indications: left should pain ? ?Procedure Details ?Consent: Risks of procedure as well as the alternatives and risks of each were explained to the (patient/caregiver).  Consent for procedure obtained. ?Time Out: Verified patient identification, verified procedure, site/side was marked, verified correct patient position, special equipment/implants available, medications/allergies/relevent history reviewed, required imaging and test results available.  Performed.  The area was cleaned with iodine and alcohol swabs.   ? ?The left shoulder pain was targeted for Extracorporeal shockwave therapy.  ? ?Preset: shoulder problems ?Power Level: 80 ?Frequency: 10 ?Impulse/cycles: 2500 ?Head size: medium  ?Session: 2nd ? ?Patient did tolerate procedure well. ? ? ? ?ASSESSMENT & PLAN:  ? ?Rotator cuff arthropathy of left shoulder ?Completed shockwave therapy ? ? ? ? ?

## 2022-03-14 ENCOUNTER — Encounter: Payer: Self-pay | Admitting: Family Medicine

## 2022-03-14 ENCOUNTER — Ambulatory Visit (INDEPENDENT_AMBULATORY_CARE_PROVIDER_SITE_OTHER): Payer: Self-pay | Admitting: Family Medicine

## 2022-03-14 DIAGNOSIS — M12812 Other specific arthropathies, not elsewhere classified, left shoulder: Secondary | ICD-10-CM

## 2022-03-14 NOTE — Assessment & Plan Note (Signed)
Completed shockwave therapy  

## 2022-03-14 NOTE — Progress Notes (Signed)
?  Faith Webb - 81 y.o. female MRN [id number]  Date of birth: 05-Aug-1941 ? ?SUBJECTIVE:  Including CC & ROS.  ?No chief complaint on file. ? ? ?Faith Webb is a 81 y.o. female that is  here for shockwave therapy. ? ? ? ?Review of Systems ?See HPI  ? ?HISTORY: Past Medical, Surgical, Social, and Family History Reviewed & Updated per EMR.   ?Pertinent Historical Findings include: ? ?Past Medical History:  ?Diagnosis Date  ? Allergy   ? Arthritis   ? Hyperlipidemia   ? Hypertension   ? ? ?Past Surgical History:  ?Procedure Laterality Date  ? ABDOMINAL HYSTERECTOMY    ? BREAST BIOPSY Bilateral   ? BREAST SURGERY    ? EYE SURGERY    ? ? ? ?PHYSICAL EXAM:  ?VS: Ht 5' 1.5" (1.562 m)   Wt 144 lb (65.3 kg)   BMI 26.77 kg/m?  ?Physical Exam ?Gen: NAD, alert, cooperative with exam, well-appearing ?MSK:  ?Neurovascularly intact   ? ?ECSWT Note ?Faith Webb Day ?05-02-1941 ? ?Procedure: ECSWT ?Indications: left shoulder pain ? ?Procedure Details ?Consent: Risks of procedure as well as the alternatives and risks of each were explained to the (patient/caregiver).  Consent for procedure obtained. ?Time Out: Verified patient identification, verified procedure, site/side was marked, verified correct patient position, special equipment/implants available, medications/allergies/relevent history reviewed, required imaging and test results available.  Performed.  The area was cleaned with iodine and alcohol swabs.   ? ?The left shoulder was targeted for Extracorporeal shockwave therapy.  ? ?Preset: shoulder problems ?Power Level: 90 ?Frequency: 10 ?Impulse/cycles: 2300 ?Head size: medium  ?Session: 3rd ? ?Patient did tolerate procedure well. ? ? ? ?ASSESSMENT & PLAN:  ? ?Rotator cuff arthropathy of left shoulder ?Completed shockwave therapy  ? ? ? ? ?

## 2022-03-20 ENCOUNTER — Ambulatory Visit: Payer: Medicare PPO | Admitting: Family Medicine

## 2022-03-22 ENCOUNTER — Encounter: Payer: Self-pay | Admitting: Family Medicine

## 2022-03-22 ENCOUNTER — Ambulatory Visit (INDEPENDENT_AMBULATORY_CARE_PROVIDER_SITE_OTHER): Payer: Self-pay | Admitting: Family Medicine

## 2022-03-22 DIAGNOSIS — M12812 Other specific arthropathies, not elsewhere classified, left shoulder: Secondary | ICD-10-CM

## 2022-03-22 NOTE — Progress Notes (Signed)
?  Faith Webb - 81 y.o. female MRN [id number]  Date of birth: 1941-09-24 ? ?SUBJECTIVE:  Including CC & ROS.  ?No chief complaint on file. ? ? ?Faith Webb is a 81 y.o. female that is here for shockwave therapy. ? ? ? ?Review of Systems ?See HPI  ? ?HISTORY: Past Medical, Surgical, Social, and Family History Reviewed & Updated per EMR.   ?Pertinent Historical Findings include: ? ?Past Medical History:  ?Diagnosis Date  ? Allergy   ? Arthritis   ? Hyperlipidemia   ? Hypertension   ? ? ?Past Surgical History:  ?Procedure Laterality Date  ? ABDOMINAL HYSTERECTOMY    ? BREAST BIOPSY Bilateral   ? BREAST SURGERY    ? EYE SURGERY    ? ? ? ?PHYSICAL EXAM:  ?VS: Ht 5' 1.5" (1.562 m)   Wt 144 lb (65.3 kg)   BMI 26.77 kg/m?  ?Physical Exam ?Gen: NAD, alert, cooperative with exam, well-appearing ?MSK:  ?Neurovascularly intact   ? ?ECSWT Note ?Faith Webb ?1941-03-18 ? ?Procedure: ECSWT ?Indications: left shoulder pain ? ?Procedure Details ?Consent: Risks of procedure as well as the alternatives and risks of each were explained to the (patient/caregiver).  Consent for procedure obtained. ?Time Out: Verified patient identification, verified procedure, site/side was marked, verified correct patient position, special equipment/implants available, medications/allergies/relevent history reviewed, required imaging and test results available.  Performed.  The area was cleaned with iodine and alcohol swabs.   ? ?The left shoulder  was targeted for Extracorporeal shockwave therapy.  ? ?Preset: shoulder problems ?Power Level: 70 ?Frequency: 10 ?Impulse/cycles: 2300 ?Head size: medium  ?Session: 4 ? ?Patient did tolerate procedure well. ? ? ? ?ASSESSMENT & PLAN:  ? ?Rotator cuff arthropathy of left shoulder ?Completed shockwave therapy  ? ? ? ? ?

## 2022-03-22 NOTE — Assessment & Plan Note (Signed)
Completed shockwave therapy  

## 2022-04-17 ENCOUNTER — Telehealth: Payer: Self-pay | Admitting: Family Medicine

## 2022-04-17 DIAGNOSIS — I671 Cerebral aneurysm, nonruptured: Secondary | ICD-10-CM

## 2022-04-17 NOTE — Telephone Encounter (Signed)
Pt is needing a new referral sent to Dr.Jaffe's office.

## 2022-04-18 NOTE — Telephone Encounter (Signed)
New referral placed.

## 2022-04-18 NOTE — Telephone Encounter (Signed)
Okay to place new referral. Pt seen by Neurology 02/2020. Okay to place a new referral?

## 2022-04-25 ENCOUNTER — Telehealth: Payer: Self-pay | Admitting: Family Medicine

## 2022-04-25 ENCOUNTER — Encounter: Payer: Self-pay | Admitting: Neurology

## 2022-04-25 ENCOUNTER — Ambulatory Visit: Payer: Medicare PPO | Admitting: Neurology

## 2022-04-25 VITALS — BP 146/78 | HR 81 | Ht 61.0 in | Wt 146.0 lb

## 2022-04-25 DIAGNOSIS — R42 Dizziness and giddiness: Secondary | ICD-10-CM | POA: Diagnosis not present

## 2022-04-25 DIAGNOSIS — I671 Cerebral aneurysm, nonruptured: Secondary | ICD-10-CM

## 2022-04-25 NOTE — Patient Instructions (Addendum)
Check MRA of head.  Further recommendations pending results.   We have sent a referral to [facility] for your MRA  and they will call you directly to schedule your appointment. They are located at [place]. If you need to contact them directly please call [phone].

## 2022-04-25 NOTE — Progress Notes (Signed)
NEUROLOGY FOLLOW UP OFFICE NOTE  Faith Webb [id number]  Assessment/Plan:   1.  Dizziness, vague.  No clear neurologic etiology. 2.  2 mm left paraophthalmic artery aneurysm, incidental finding.  Stable.    1.  I would repeat MRA of head now 2.  Further recommendations pending results  Subjective:  Faith Webb is a 81 year old female with hypertension, hyperlipidemia, subclinical hyperthyroidism and arthritis who follows up for dizziness, headache and small paraophthalmic artery aneurysm.   UPDATE: MRA of head from February 2022 personally reviewed was stable.  Overall doing well.  Still with some chronic dizziness but nothing significant.     HISTORY: She reports episodes of dizziness since around 2019.  She has history of palpitations that have previously resolved but then started to recur last month.  She describes it as mostly lightheadedness, sometimes with trace sensation of movement.  She has associated blurred vision.  No nausea, vomiting, double vision, facial droop or numbness or weakness.  It lasts 5 to 10 minutes.  It occurs daily.  It usually occurs in the morning or standing in the kitchen preparing meals.  It is not aggravated by movement.  Turning off the light helps relieve symptoms.  She was also found to have subclinical hyperthyroidism in June, with low TSH of 0.321.  She has followed up with endocrinology and is not needing antithyroid medications.  She has followed up with cardiology and is on Lopressor.  She also reports dull periorbital nonthrobbing headache when she wakes up in the morning.  It typically lasts 10 minutes.  It occurs maybe once a week to once every other week.  It is separate from the dizziness.     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  She had a follow up MRA of head on 02/06/2020  personally reviewed which showed stable 2 mm left paraophthalmic aneurysm with no other changes.   Current NSAIDs:  ASA PRN Current analgesics:  none Current antihypertensive:  Lopressor 25mg  twice daily; HCTZ Current antihistamine:  Meclizine Current vitamins/supplements:  Magnesium oxide 420mg  daily Current antihistamine/decongestant:  Xyzal  PAST MEDICAL HISTORY: Past Medical History:  Diagnosis Date   Allergy    Arthritis    Hyperlipidemia    Hypertension     MEDICATIONS: Current Outpatient Medications on File Prior to Visit  Medication Sig Dispense Refill   hydrochlorothiazide (HYDRODIURIL) 25 MG tablet TAKE 1 TABLET(25 MG) BY MOUTH DAILY 90 tablet 1   Magnesium Oxide 420 MG TABS Take 0.5 tablets by mouth daily.      metoprolol tartrate (LOPRESSOR) 25 MG tablet TAKE 1 TABLET(25 MG) BY MOUTH TWICE DAILY 180 tablet 1   Omega-3 Fatty Acids (THE VERY FINEST FISH OIL) LIQD Take by mouth.     omeprazole (PRILOSEC) 20 MG capsule Take 20 mg by mouth daily as needed.     potassium chloride (KLOR-CON) 10 MEQ tablet TAKE 3 TABLETS(30 MEQ) BY MOUTH TWICE DAILY 270 tablet 1   rosuvastatin (CRESTOR) 10 MG tablet TAKE 1 TABLET(10 MG) BY MOUTH AT BEDTIME 90 tablet 1   Vitamin D, Ergocalciferol, (DRISDOL) 1.25 MG (50000 UNIT) CAPS capsule TAKE 1 CAPSULE BY MOUTH EVERY WEEK (Patient taking differently: 1,000 Units daily.) 12 capsule 3   COVID-19 mRNA bivalent vaccine, Pfizer, (PFIZER COVID-19 VAC BIVALENT) injection Inject into the muscle. (Patient not taking: Reported on 04/25/2022)  0.3 mL 0   influenza vaccine adjuvanted (FLUAD) 0.5 ML injection Inject into the muscle. (Patient not taking: Reported on 04/25/2022) 0.5 mL 0   No current facility-administered medications on file prior to visit.    ALLERGIES: Allergies  Allergen Reactions   Amoxicillin Palpitations    FAMILY HISTORY: Family History  Problem Relation Age of Onset   Diabetes Mother    Heart disease Mother    Hypertension  Mother    Stroke Mother    Diabetes Father    Cancer Sister    Diabetes Sister    Diabetes Brother    Hyperlipidemia Brother    Stroke Brother    Diabetes Sister    Hyperlipidemia Sister    Hypertension Sister    Kidney disease Sister    Stroke Sister    Diabetes Sister    Hyperlipidemia Sister    Healthy Child       Objective:  Blood pressure (!) 146/78, pulse 81, height 5\' 1"  (1.549 m), weight 146 lb (66.2 kg), SpO2 91 %. General: No acute distress.  Patient appears well-groomed.   Head:  Normocephalic/atraumatic Eyes:  Fundi examined but not visualized Neck: supple, no paraspinal tenderness, full range of motion Heart:  Regular rate and rhythm Neurological Exam: alert and oriented to person, place, and time.  Speech fluent and not dysarthric, language intact.  Left greater than right ptosis.  Otherwise, CN II-XII intact. Bulk and tone normal, muscle strength 5/5 throughout.  Sensation to light touch intact.  Deep tendon reflexes 2+ throughout, toes downgoing.  Finger to nose testing intact.  Gait steady, Romberg negative.   , DO  CC: Shon Millet, DO

## 2022-04-25 NOTE — Telephone Encounter (Signed)
Pt would like to start taking rx again. Please advise.   Medication:  meclizine (ANTIVERT) 12.5 MG tablet   Has the patient contacted their pharmacy? Yes.     Preferred Pharmacy: [facility] [place], [address]   [place], [place] [address]  Phone:  [phone]  Fax:  [phone]

## 2022-04-26 ENCOUNTER — Other Ambulatory Visit: Payer: Self-pay | Admitting: Family Medicine

## 2022-04-26 DIAGNOSIS — H81399 Other peripheral vertigo, unspecified ear: Secondary | ICD-10-CM

## 2022-04-26 MED ORDER — MECLIZINE HCL 12.5 MG PO TABS: 12.5000 mg | ORAL_TABLET | Freq: Three times a day (TID) | ORAL | 0 refills | Status: DC | PRN

## 2022-05-01 ENCOUNTER — Ambulatory Visit
Admission: RE | Admit: 2022-05-01 | Discharge: 2022-05-01 | Disposition: A | Discharge status: Home / Self Care | Payer: Medicare PPO | Source: Ambulatory Visit | Attending: Neurology | Admitting: Neurology

## 2022-05-01 DIAGNOSIS — Z8679 Personal history of other diseases of the circulatory system: Secondary | ICD-10-CM | POA: Diagnosis not present

## 2022-05-01 DIAGNOSIS — Q283 Other malformations of cerebral vessels: Secondary | ICD-10-CM | POA: Diagnosis not present

## 2022-05-02 NOTE — Progress Notes (Signed)
Patient advised.

## 2022-05-07 ENCOUNTER — Ambulatory Visit (INDEPENDENT_AMBULATORY_CARE_PROVIDER_SITE_OTHER): Payer: Medicare PPO

## 2022-05-07 VITALS — BP 155/83 | HR 73 | Temp 98.9°F | Resp 16 | Ht 61.0 in | Wt 144.6 lb

## 2022-05-07 DIAGNOSIS — Z1231 Encounter for screening mammogram for malignant neoplasm of breast: Secondary | ICD-10-CM

## 2022-05-07 DIAGNOSIS — Z0000 Encounter for general adult medical examination without abnormal findings: Secondary | ICD-10-CM | POA: Diagnosis not present

## 2022-05-07 DIAGNOSIS — Z780 Asymptomatic menopausal state: Secondary | ICD-10-CM

## 2022-05-07 NOTE — Patient Instructions (Signed)
Faith Webb , Thank you for taking time to come for your Medicare Wellness Visit. I appreciate your ongoing commitment to your health goals. Please review the following plan we discussed and let me know if I can assist you in the future.   Screening recommendations/referrals: Colonoscopy: no longer needed Mammogram: ordered 05/07/22 Bone Density: ordered 05/07/22 Recommended yearly ophthalmology/optometry visit for glaucoma screening and checkup Recommended yearly dental visit for hygiene and checkup  Vaccinations: Influenza vaccine: up to date Pneumococcal vaccine: up to date Tdap vaccine: Due-May obtain vaccine at  your local pharmacy.  Shingles vaccine: Due-May obtain vaccine at your local pharmacy.    Covid-19:completed  Advanced directives: no   Conditions/risks identified: see problem list   Next appointment: Follow up in one year for your annual wellness visit    Preventive Care 65 Years and Older, Female Preventive care refers to lifestyle choices and visits with your health care provider that can promote health and wellness. What does preventive care include? A yearly physical exam. This is also called an annual well check. Dental exams once or twice a year. Routine eye exams. Ask your health care provider how often you should have your eyes checked. Personal lifestyle choices, including: Daily care of your teeth and gums. Regular physical activity. Eating a healthy diet. Avoiding tobacco and drug use. Limiting alcohol use. Practicing safe sex. Taking low-dose aspirin every day. Taking vitamin and mineral supplements as recommended by your health care provider. What happens during an annual well check? The services and screenings done by your health care provider during your annual well check will depend on your age, overall health, lifestyle risk factors, and family history of disease. Counseling  Your health care provider may ask you questions about your: Alcohol  use. Tobacco use. Drug use. Emotional well-being. Home and relationship well-being. Sexual activity. Eating habits. History of falls. Memory and ability to understand (cognition). Work and work Statistician. Reproductive health. Screening  You may have the following tests or measurements: Height, weight, and BMI. Blood pressure. Lipid and cholesterol levels. These may be checked every 5 years, or more frequently if you are over 60 years old. Skin check. Lung cancer screening. You may have this screening every year starting at age 51 if you have a 30-pack-year history of smoking and currently smoke or have quit within the past 15 years. Fecal occult blood test (FOBT) of the stool. You may have this test every year starting at age 80. Flexible sigmoidoscopy or colonoscopy. You may have a sigmoidoscopy every 5 years or a colonoscopy every 10 years starting at age 30. Hepatitis C blood test. Hepatitis B blood test. Sexually transmitted disease (STD) testing. Diabetes screening. This is done by checking your blood sugar (glucose) after you have not eaten for a while (fasting). You may have this done every 1-3 years. Bone density scan. This is done to screen for osteoporosis. You may have this done starting at age 46. Mammogram. This may be done every 1-2 years. Talk to your health care provider about how often you should have regular mammograms. Talk with your health care provider about your test results, treatment options, and if necessary, the need for more tests. Vaccines  Your health care provider may recommend certain vaccines, such as: Influenza vaccine. This is recommended every year. Tetanus, diphtheria, and acellular pertussis (Tdap, Td) vaccine. You may need a Td booster every 10 years. Zoster vaccine. You may need this after age 88. Pneumococcal 13-valent conjugate (PCV13) vaccine. One dose is  recommended after age 66. Pneumococcal polysaccharide (PPSV23) vaccine. One dose is  recommended after age 78. Talk to your health care provider about which screenings and vaccines you need and how often you need them. This information is not intended to replace advice given to you by your health care provider. Make sure you discuss any questions you have with your health care provider. Document Released: 12/09/2015 Document Revised: 08/01/2016 Document Reviewed: 09/13/2015 Elsevier Interactive Patient Education  2017 [place] Prevention in the Home Falls can cause injuries. They can happen to people of all ages. There are many things you can do to make your home safe and to help prevent falls. What can I do on the outside of my home? Regularly fix the edges of walkways and driveways and fix any cracks. Remove anything that might make you trip as you walk through a door, such as a raised step or threshold. Trim any bushes or trees on the path to your home. Use bright outdoor lighting. Clear any walking paths of anything that might make someone trip, such as rocks or tools. Regularly check to see if handrails are loose or broken. Make sure that both sides of any steps have handrails. Any raised decks and porches should have guardrails on the edges. Have any leaves, snow, or ice cleared regularly. Use sand or salt on walking paths during winter. Clean up any spills in your garage right away. This includes oil or grease spills. What can I do in the bathroom? Use night lights. Install grab bars by the toilet and in the tub and shower. Do not use towel bars as grab bars. Use non-skid mats or decals in the tub or shower. If you need to sit down in the shower, use a plastic, non-slip stool. Keep the floor dry. Clean up any water that spills on the floor as soon as it happens. Remove soap buildup in the tub or shower regularly. Attach bath mats securely with double-sided non-slip rug tape. Do not have throw rugs and other things on the floor that can make you  trip. What can I do in the bedroom? Use night lights. Make sure that you have a light by your bed that is easy to reach. Do not use any sheets or blankets that are too big for your bed. They should not hang down onto the floor. Have a firm chair that has side arms. You can use this for support while you get dressed. Do not have throw rugs and other things on the floor that can make you trip. What can I do in the kitchen? Clean up any spills right away. Avoid walking on wet floors. Keep items that you use a lot in easy-to-reach places. If you need to reach something above you, use a strong step stool that has a grab bar. Keep electrical cords out of the way. Do not use floor polish or wax that makes floors slippery. If you must use wax, use non-skid floor wax. Do not have throw rugs and other things on the floor that can make you trip. What can I do with my stairs? Do not leave any items on the stairs. Make sure that there are handrails on both sides of the stairs and use them. Fix handrails that are broken or loose. Make sure that handrails are as long as the stairways. Check any carpeting to make sure that it is firmly attached to the stairs. Fix any carpet that is loose or worn. Avoid having  throw rugs at the top or bottom of the stairs. If you do have throw rugs, attach them to the floor with carpet tape. Make sure that you have a light switch at the top of the stairs and the bottom of the stairs. If you do not have them, ask someone to add them for you. What else can I do to help prevent falls? Wear shoes that: Do not have high heels. Have rubber bottoms. Are comfortable and fit you well. Are closed at the toe. Do not wear sandals. If you use a stepladder: Make sure that it is fully opened. Do not climb a closed stepladder. Make sure that both sides of the stepladder are locked into place. Ask someone to hold it for you, if possible. Clearly mark and make sure that you can  see: Any grab bars or handrails. First and last steps. Where the edge of each step is. Use tools that help you move around (mobility aids) if they are needed. These include: Canes. Walkers. Scooters. Crutches. Turn on the lights when you go into a dark area. Replace any light bulbs as soon as they burn out. Set up your furniture so you have a clear path. Avoid moving your furniture around. If any of your floors are uneven, fix them. If there are any pets around you, be aware of where they are. Review your medicines with your doctor. Some medicines can make you feel dizzy. This can increase your chance of falling. Ask your doctor what other things that you can do to help prevent falls. This information is not intended to replace advice given to you by your health care provider. Make sure you discuss any questions you have with your health care provider. Document Released: 09/08/2009 Document Revised: 04/19/2016 Document Reviewed: 12/17/2014 Elsevier Interactive Patient Education  2017 [employer].

## 2022-05-07 NOTE — Progress Notes (Cosign Needed Addendum)
Subjective:   Faith Webb is a 81 y.o. female who presents for Medicare Annual (Subsequent) preventive examination.  Review of Systems     Cardiac Risk Factors include: advanced age (>16men, >4 women);hypertension;dyslipidemia     Objective:    Today's Vitals   05/07/22 1150  BP: (!) 155/83  Pulse: 73  Resp: 16  Temp: 98.9 F (37.2 C)  SpO2: 99%  Weight: 144 lb 9.6 oz (65.6 kg)  Height: 5\' 1"  (1.549 m)   Body mass index is 27.32 kg/m.     05/07/2022   11:53 AM 04/25/2022    9:18 AM 01/11/2022   10:57 AM 08/09/2021   12:00 PM 05/03/2021    9:45 AM 03/03/2020    3:20 PM 02/09/2020   10:23 AM  Advanced Directives  Does Patient Have a Medical Advance Directive? Yes Yes Yes No Yes Yes Yes  Type of 02/11/2020 of [place];Out of facility DNR (pink MOST or yellow form);Living will Healthcare Power of [place];Living will Healthcare Power of [place];Living will  Living will;Healthcare Power of [place] Power of [place];Living will Healthcare Power of [place];Living will  Does patient want to make changes to medical advance directive? No - Patient declined  No - Patient declined    No - Patient declined  Copy of Healthcare Power of Attorney in Chart? No - copy requested  No - copy requested  No - copy requested  No - copy requested  Would patient like information on creating a medical advance directive?    No - Patient declined       Current Medications (verified) Outpatient Encounter Medications as of 05/07/2022  Medication Sig   hydrochlorothiazide (HYDRODIURIL) 25 MG tablet TAKE 1 TABLET(25 MG) BY MOUTH DAILY   Magnesium Oxide 420 MG TABS Take 0.5 tablets by mouth daily.    meclizine (ANTIVERT) 12.5 MG tablet Take 1 tablet (12.5 mg total) by mouth 3 (three) times daily as needed for dizziness.   metoprolol tartrate (LOPRESSOR) 25 MG tablet TAKE 1 TABLET(25 MG) BY MOUTH TWICE DAILY   Omega-3 Fatty Acids (THE VERY FINEST FISH OIL) LIQD Take by  mouth.   potassium chloride (KLOR-CON) 10 MEQ tablet TAKE 3 TABLETS(30 MEQ) BY MOUTH TWICE DAILY   rosuvastatin (CRESTOR) 10 MG tablet TAKE 1 TABLET(10 MG) BY MOUTH AT BEDTIME   Vitamin D, Ergocalciferol, (DRISDOL) 1.25 MG (50000 UNIT) CAPS capsule TAKE 1 CAPSULE BY MOUTH EVERY WEEK (Patient taking differently: 1,000 Units daily.)   [DISCONTINUED] influenza vaccine adjuvanted (FLUAD) 0.5 ML injection Inject into the muscle.   [DISCONTINUED] COVID-19 mRNA bivalent vaccine, Pfizer, (PFIZER COVID-19 VAC BIVALENT) injection Inject into the muscle. (Patient not taking: Reported on 04/25/2022)   [DISCONTINUED] omeprazole (PRILOSEC) 20 MG capsule Take 20 mg by mouth daily as needed.   No facility-administered encounter medications on file as of 05/07/2022.    Allergies (verified) Amoxicillin   History: Past Medical History:  Diagnosis Date   Allergy    Arthritis    Hyperlipidemia    Hypertension    Past Surgical History:  Procedure Laterality Date   ABDOMINAL HYSTERECTOMY     BREAST BIOPSY Bilateral    BREAST SURGERY     EYE SURGERY     Family History  Problem Relation Age of Onset   Diabetes Mother    Heart disease Mother    Hypertension Mother    Stroke Mother    Diabetes Father    Cancer Sister    Diabetes Sister  Diabetes Brother    Hyperlipidemia Brother    Stroke Brother    Diabetes Sister    Hyperlipidemia Sister    Hypertension Sister    Kidney disease Sister    Stroke Sister    Diabetes Sister    Hyperlipidemia Sister    Healthy Child    Social History   Socioeconomic History   Marital status: Widowed    Spouse name: Not on file   Number of children: 2   Years of education: Not on file   Highest education level: Bachelor's degree (e.g., BA, AB, BS)  Occupational History   Not on file  Tobacco Use   Smoking status: Never   Smokeless tobacco: Never  Vaping Use   Vaping Use: Never used  Substance and Sexual Activity   Alcohol use: Never   Drug use:  Never   Sexual activity: Not on file  Other Topics Concern   Not on file  Social History Narrative   Pt lives alone in 1 story home, she has 2 children, right handed, she drinks coffee daily, tea/ soda occasionally.   Social Determinants of Health   Financial Resource Strain: Low Risk  (05/03/2021)   Overall Financial Resource Strain (CARDIA)    Difficulty of Paying Living Expenses: Not hard at all  Food Insecurity: No Food Insecurity (05/03/2021)   Hunger Vital Sign    Worried About Running Out of Food in the Last Year: Never true    Ran Out of Food in the Last Year: Never true  Transportation Needs: No Transportation Needs (05/03/2021)   PRAPARE - Administrator, Civil ServiceTransportation    Lack of Transportation (Medical): No    Lack of Transportation (Non-Medical): No  Physical Activity: Sufficiently Active (05/03/2021)   Exercise Vital Sign    Days of Exercise per Week: 3 days    Minutes of Exercise per Session: 60 min  Stress: No Stress Concern Present (05/03/2021)   [employer] - Occupational Stress Questionnaire    Feeling of Stress : Not at all  Social Connections: Moderately Integrated (05/03/2021)   Social Connection and Isolation Panel [NHANES]    Frequency of Communication with Friends and Family: More than three times a week    Frequency of Social Gatherings with Friends and Family: More than three times a week    Attends Religious Services: More than 4 times per year    Active Member of [employer] or Organizations: Yes    Attends BankerClub or Organization Meetings: More than 4 times per year    Marital Status: Widowed    Tobacco Counseling Counseling given: Not Answered   Clinical Intake:  Pre-visit preparation completed: Yes  Pain : No/denies pain     BMI - recorded: 27.32 Nutritional Status: BMI 25 -29 Overweight Nutritional Risks: None Diabetes: No  How often do you need to have someone help you when you read instructions, pamphlets, or other written materials  from your doctor or pharmacy?: 1 - Never  Diabetic?No  Interpreter Needed?: No  Information entered by :: Shakira Emmylou Bieker   Activities of Daily Living    05/07/2022   11:55 AM  In your present state of health, do you have any difficulty performing the following activities:  Hearing? 0  Vision? 0  Difficulty concentrating or making decisions? 0  Walking or climbing stairs? 0  Dressing or bathing? 0  Doing errands, shopping? 0  Preparing Food and eating ? N  Using the Toilet? N  In the past six months, have  you accidently leaked urine? N  Do you have problems with loss of bowel control? N  Managing your Medications? N  Managing your Finances? N  Housekeeping or managing your Housekeeping? N    Patient Care Team: Zola Button, Grayling Congress, DO as PCP - General (Family Medicine)  Indicate any recent Medical Services you may have received from other than [facility] providers in the past year (date may be approximate).     Assessment:   This is a routine wellness examination for [facility].  Hearing/Vision screen No results found.  Dietary issues and exercise activities discussed: Current Exercise Habits: Home exercise routine, Type of exercise: stretching, Time (Minutes): 60, Frequency (Times/Week): 3, Weekly Exercise (Minutes/Week): 180, Intensity: Mild   Goals Addressed   None    Depression Screen    05/07/2022   11:53 AM 05/03/2021    9:48 AM 02/09/2020   10:28 AM  PHQ 2/9 Scores  PHQ - 2 Score 0 0 0    Fall Risk    05/07/2022   11:53 AM 04/25/2022    9:18 AM 05/03/2021    9:47 AM 03/03/2020    3:20 PM 02/09/2020   10:27 AM  Fall Risk   Falls in the past year? 0 0 0 0 0  Number falls in past yr: 0 0 0 0 0  Injury with Fall? 0 0 0 0 0  Risk for fall due to : No Fall Risks   No Fall Risks   Follow up Falls evaluation completed  Falls prevention discussed  Education provided;Falls prevention discussed    FALL RISK PREVENTION PERTAINING TO THE HOME:  Any stairs in or around the  home? Yes  If so, are there any without handrails? No  Home free of loose throw rugs in walkways, pet beds, electrical cords, etc? No  Adequate lighting in your home to reduce risk of falls? Yes   ASSISTIVE DEVICES UTILIZED TO PREVENT FALLS:  Life alert? Yes  Use of a cane, walker or w/c? No  Grab bars in the bathroom? Yes  Shower chair or bench in shower? No  Elevated toilet seat or a handicapped toilet? No   TIMED UP AND GO:  Was the test performed? Yes .  Length of time to ambulate 10 feet: 9 sec.   Gait steady and fast without use of assistive device  Cognitive Function:        05/07/2022   11:57 AM  6CIT Screen  What Year? 0 points  What month? 0 points  What time? 0 points  Count back from 20 0 points  Months in reverse 0 points  Repeat phrase 0 points  Total Score 0 points    Immunizations Immunization History  Administered Date(s) Administered   Fluad Quad(high Dose 65+) 08/13/2019, 08/15/2020, 10/09/2021   Influenza, High Dose Seasonal PF 09/29/2013, 09/29/2014, 09/15/2015   Influenza-Unspecified 08/15/2017, 11/17/2018   PFIZER Comirnaty(Gray Top)Covid-19 Tri-Sucrose Vaccine 06/08/2021   PFIZER(Purple Top)SARS-COV-2 Vaccination 12/06/2019, 12/27/2019   Pfizer Covid-19 Vaccine Bivalent Booster 21yrs & up 11/14/2021   Pneumococcal Conjugate-13 06/12/2016   Pneumococcal Polysaccharide-23 10/10/2012, 02/11/2013    TDAP status: Due, Education has been provided regarding the importance of this vaccine. Advised may receive this vaccine at local pharmacy or Health Dept. Aware to provide a copy of the vaccination record if obtained from local pharmacy or Health Dept. Verbalized acceptance and understanding.  Flu Vaccine status: Up to date  Pneumococcal vaccine status: Up to date  Covid-19 vaccine status: Completed vaccines  Qualifies  for Shingles Vaccine? Yes   Zostavax completed No   Shingrix Completed?: No.    Education has been provided regarding the  importance of this vaccine. Patient has been advised to call insurance company to determine out of pocket expense if they have not yet received this vaccine. Advised may also receive vaccine at local pharmacy or Health Dept. Verbalized acceptance and understanding.  Screening Tests Health Maintenance  Topic Date Due   TETANUS/TDAP  Never done   Zoster Vaccines- Shingrix (1 of 2) Never done   COVID-19 Vaccine (5 - Pfizer series) 03/15/2022   INFLUENZA VACCINE  06/26/2022   Pneumonia Vaccine 17+ Years old  Completed   DEXA SCAN  Completed   HPV VACCINES  Aged Out    Health Maintenance  Health Maintenance Due  Topic Date Due   TETANUS/TDAP  Never done   Zoster Vaccines- Shingrix (1 of 2) Never done   COVID-19 Vaccine (5 - Pfizer series) 03/15/2022    Colorectal cancer screening: No longer required.   Mammogram status: Completed 05/16/21. Repeat every year  Bone Density status: Ordered 05/07/22. Pt provided with contact info and advised to call to schedule appt.  Lung Cancer Screening: (Low Dose CT Chest recommended if Age 60-80 years, 30 pack-year currently smoking OR have quit w/in 15years.) does not qualify.   Lung Cancer Screening Referral: N/A  Additional Screening:  Hepatitis C Screening: does not qualify; Completed aged out  Vision Screening: Recommended annual ophthalmology exams for early detection of glaucoma and other disorders of the eye. Is the patient up to date with their annual eye exam?  No  Who is the provider or what is the name of the office in which the patient attends annual eye exams? N/a If pt is not established with a provider, would they like to be referred to a provider to establish care? No .   Dental Screening: Recommended annual dental exams for proper oral hygiene  Community Resource Referral / Chronic Care Management: CRR required this visit?  No   CCM required this visit?  No      Plan:     I have personally reviewed and noted the  following in the patient's chart:   Medical and social history Use of alcohol, tobacco or illicit drugs  Current medications and supplements including opioid prescriptions.  Functional ability and status Nutritional status Physical activity Advanced directives List of other physicians Hospitalizations, surgeries, and ER visits in previous 12 months Vitals Screenings to include cognitive, depression, and falls Referrals and appointments  In addition, I have reviewed and discussed with patient certain preventive protocols, quality metrics, and best practice recommendations. A written personalized care plan for preventive services as well as general preventive health recommendations were provided to patient.     Salomon Mast Sergi Gellner, CMA   05/07/2022   Nurse Notes: dexa and mammo

## 2022-05-09 ENCOUNTER — Ambulatory Visit: Payer: Medicare PPO | Admitting: Neurology

## 2022-05-10 ENCOUNTER — Encounter: Payer: Self-pay | Admitting: Family Medicine

## 2022-05-10 ENCOUNTER — Ambulatory Visit: Payer: Medicare PPO | Admitting: Family Medicine

## 2022-05-10 VITALS — BP 130/76 | HR 80 | Temp 98.4°F | Resp 18 | Ht 61.0 in | Wt 144.8 lb

## 2022-05-10 DIAGNOSIS — I10 Essential (primary) hypertension: Secondary | ICD-10-CM

## 2022-05-10 DIAGNOSIS — Z1231 Encounter for screening mammogram for malignant neoplasm of breast: Secondary | ICD-10-CM

## 2022-05-10 DIAGNOSIS — R42 Dizziness and giddiness: Secondary | ICD-10-CM

## 2022-05-10 NOTE — Patient Instructions (Addendum)
Diet for Irritable Bowel Syndrome When you have irritable bowel syndrome (IBS), it is very important to follow the eating habits that are best for your condition. IBS may cause various symptoms, such as pain in the abdomen, constipation, or diarrhea. Choosing the right foods can help to ease the discomfort from these symptoms. Work with your health care provider and dietitian to find the eating plan that will help to control your symptoms. What are tips for following this plan?  Keep a food diary. This will help you identify foods that cause symptoms. Write down: What you eat and when you eat it. What symptoms you have. When symptoms occur in relation to your meals, such as "pain in abdomen 2 hours after dinner." Eat your meals slowly and in a relaxed setting. Aim to eat 5-6 small meals per day. Do not skip meals. Drink enough fluid to keep your urine pale yellow. Ask your health care provider if you should take an over-the-counter probiotic to help restore healthy bacteria in your gut (digestive tract). Probiotics are foods that contain good bacteria and yeasts. Your dietitian may have specific dietary recommendations for you based on your symptoms. Your dietitian may recommend that you: Avoid foods that cause symptoms. Talk with your dietitian about other ways to get the same nutrients that are in those problem foods. Avoid foods with gluten. Gluten is a protein that is found in rye, wheat, and barley. Eat more foods that contain soluble fiber. Examples of foods with high soluble fiber include oats, seeds, and certain fruits and vegetables. Take a fiber supplement if told by your dietitian. Reduce or avoid certain foods called FODMAPs. These are foods that contain sugars that are hard for some people to digest. Ask your health care provider which foods to avoid. What foods should I avoid? The following are some foods and drinks that may make your symptoms worse: Fatty foods, such as french  fries. Foods that contain gluten, such as pasta and cereal. Dairy products, such as milk, cheese, and ice cream. Spicy foods. Alcohol. Products with caffeine, such as coffee, tea, or chocolate. Carbonated drinks, such as soda. Foods that are high in FODMAPs. These include certain fruits and vegetables. Products with sweeteners such as honey, high fructose corn syrup, sorbitol, and mannitol. The items listed above may not be a complete list of foods and beverages you should avoid. Contact a dietitian for more information. What foods are good sources of fiber? Your health care provider or dietitian may recommend that you eat more foods that contain fiber. Fiber can help to reduce constipation and other IBS symptoms. Add foods with fiber to your diet a little at a time so your body can get used to them. Too much fiber at one time might cause gas and swelling of your abdomen. The following are some foods that are good sources of fiber: Berries, such as raspberries, strawberries, and blueberries. Tomatoes. Carrots. Brown rice. Oats. Seeds, such as chia and pumpkin seeds. The items listed above may not be a complete list of recommended sources of fiber. Contact your dietitian for more options. Where to find more information International Foundation for Functional Gastrointestinal Disorders: aboutibs.org National Institute of Diabetes and Digestive and Kidney Diseases: niddk.nih.gov Summary When you have irritable bowel syndrome (IBS), it is very important to follow the eating habits that are best for your condition. IBS may cause various symptoms, such as pain in the abdomen, constipation, or diarrhea. Choosing the right foods can help to ease the   discomfort that comes from symptoms. Your health care provider or dietitian may recommend that you eat more foods that contain fiber. Keep a food diary. This will help you identify foods that cause symptoms. This information is not intended to replace  advice given to you by your health care provider. Make sure you discuss any questions you have with your health care provider. Document Revised: 10/24/2021 Document Reviewed: 10/24/2021 Elsevier Patient Education  2023 Elsevier Inc. DASH Eating Plan DASH stands for Dietary Approaches to Stop Hypertension. The DASH eating plan is a healthy eating plan that has been shown to: Reduce high blood pressure (hypertension). Reduce your risk for type 2 diabetes, heart disease, and stroke. Help with weight loss. What are tips for following this plan? Reading food labels Check food labels for the amount of salt (sodium) per serving. Choose foods with less than 5 percent of the Daily Value of sodium. Generally, foods with less than 300 milligrams (mg) of sodium per serving fit into this eating plan. To find whole grains, look for the word "whole" as the first word in the ingredient list. Shopping Buy products labeled as "low-sodium" or "no salt added." Buy fresh foods. Avoid canned foods and pre-made or frozen meals. Cooking Avoid adding salt when cooking. Use salt-free seasonings or herbs instead of table salt or sea salt. Check with your health care provider or pharmacist before using salt substitutes. Do not fry foods. Cook foods using healthy methods such as baking, boiling, grilling, roasting, and broiling instead. Cook with heart-healthy oils, such as olive, canola, avocado, soybean, or sunflower oil. Meal planning  Eat a balanced diet that includes: 4 or more servings of fruits and 4 or more servings of vegetables each day. Try to fill one-half of your plate with fruits and vegetables. 6-8 servings of whole grains each day. Less than 6 oz (170 g) of lean meat, poultry, or fish each day. A 3-oz (85-g) serving of meat is about the same size as a deck of cards. One egg equals 1 oz (28 g). 2-3 servings of low-fat dairy each day. One serving is 1 cup (237 mL). 1 serving of nuts, seeds, or beans 5  times each week. 2-3 servings of heart-healthy fats. Healthy fats called omega-3 fatty acids are found in foods such as walnuts, flaxseeds, fortified milks, and eggs. These fats are also found in cold-water fish, such as sardines, salmon, and mackerel. Limit how much you eat of: Canned or prepackaged foods. Food that is high in trans fat, such as some fried foods. Food that is high in saturated fat, such as fatty meat. Desserts and other sweets, sugary drinks, and other foods with added sugar. Full-fat dairy products. Do not salt foods before eating. Do not eat more than 4 egg yolks a week. Try to eat at least 2 vegetarian meals a week. Eat more home-cooked food and less restaurant, buffet, and fast food. Lifestyle When eating at a restaurant, ask that your food be prepared with less salt or no salt, if possible. If you drink alcohol: Limit how much you use to: 0-1 drink a day for women who are not pregnant. 0-2 drinks a day for men. Be aware of how much alcohol is in your drink. In the [place] one drink equals one 12 oz bottle of beer (355 mL), one 5 oz glass of wine (148 mL), or one 1 oz glass of hard liquor (44 mL). General information Avoid eating more than 2,300 mg of salt a day. If  you have hypertension, you may need to reduce your sodium intake to 1,500 mg a day. Work with your health care provider to maintain a healthy body weight or to lose weight. Ask what an ideal weight is for you. Get at least 30 minutes of exercise that causes your heart to beat faster (aerobic exercise) most days of the week. Activities may include walking, swimming, or biking. Work with your health care provider or dietitian to adjust your eating plan to your individual calorie needs. What foods should I eat? Fruits All fresh, dried, or frozen fruit. Canned fruit in natural juice (without added sugar). Vegetables Fresh or frozen vegetables (raw, steamed, roasted, or grilled). Low-sodium or reduced-sodium  tomato and vegetable juice. Low-sodium or reduced-sodium tomato sauce and tomato paste. Low-sodium or reduced-sodium canned vegetables. Grains Whole-grain or whole-wheat bread. Whole-grain or whole-wheat pasta. Brown rice. Orpah Cobb. Bulgur. Whole-grain and low-sodium cereals. Pita bread. Low-fat, low-sodium crackers. Whole-wheat flour tortillas. Meats and other proteins Skinless chicken or [place]. Ground chicken or [place]. Pork with fat trimmed off. Fish and seafood. Egg whites. Dried beans, peas, or lentils. Unsalted nuts, nut butters, and seeds. Unsalted canned beans. Lean cuts of beef with fat trimmed off. Low-sodium, lean precooked or cured meat, such as sausages or meat loaves. Dairy Low-fat (1%) or fat-free (skim) milk. Reduced-fat, low-fat, or fat-free cheeses. Nonfat, low-sodium ricotta or cottage cheese. Low-fat or nonfat yogurt. Low-fat, low-sodium cheese. Fats and oils Soft margarine without trans fats. Vegetable oil. Reduced-fat, low-fat, or light mayonnaise and salad dressings (reduced-sodium). Canola, safflower, olive, avocado, soybean, and sunflower oils. Avocado. Seasonings and condiments Herbs. Spices. Seasoning mixes without salt. Other foods Unsalted popcorn and pretzels. Fat-free sweets. The items listed above may not be a complete list of foods and beverages you can eat. Contact a dietitian for more information. What foods should I avoid? Fruits Canned fruit in a light or heavy syrup. Fried fruit. Fruit in cream or butter sauce. Vegetables Creamed or fried vegetables. Vegetables in a cheese sauce. Regular canned vegetables (not low-sodium or reduced-sodium). Regular canned tomato sauce and paste (not low-sodium or reduced-sodium). Regular tomato and vegetable juice (not low-sodium or reduced-sodium). Rosita Fire. Olives. Grains Baked goods made with fat, such as croissants, muffins, or some breads. Dry pasta or rice meal packs. Meats and other proteins Fatty cuts of  meat. Ribs. Fried meat. Tomasa Blase. Bologna, salami, and other precooked or cured meats, such as sausages or meat loaves. Fat from the back of a pig (fatback). Bratwurst. Salted nuts and seeds. Canned beans with added salt. Canned or smoked fish. Whole eggs or egg yolks. Chicken or [place] with skin. Dairy Whole or 2% milk, cream, and half-and-half. Whole or full-fat cream cheese. Whole-fat or sweetened yogurt. Full-fat cheese. Nondairy creamers. Whipped toppings. Processed cheese and cheese spreads. Fats and oils Butter. Stick margarine. Lard. Shortening. Ghee. Bacon fat. Tropical oils, such as coconut, palm kernel, or palm oil. Seasonings and condiments Onion salt, garlic salt, seasoned salt, table salt, and sea salt. Worcestershire sauce. Tartar sauce. Barbecue sauce. Teriyaki sauce. Soy sauce, including reduced-sodium. Steak sauce. Canned and packaged gravies. Fish sauce. Oyster sauce. Cocktail sauce. Store-bought horseradish. Ketchup. Mustard. Meat flavorings and tenderizers. Bouillon cubes. Hot sauces. Pre-made or packaged marinades. Pre-made or packaged taco seasonings. Relishes. Regular salad dressings. Other foods Salted popcorn and pretzels. The items listed above may not be a complete list of foods and beverages you should avoid. Contact a dietitian for more information. Where to find more information National Heart, Lung, and  Blood Institute: PopSteam.is American Heart Association: www.heart.org Academy of Nutrition and Dietetics: www.eatright.org National Kidney Foundation: www.kidney.org Summary The DASH eating plan is a healthy eating plan that has been shown to reduce high blood pressure (hypertension). It may also reduce your risk for type 2 diabetes, heart disease, and stroke. When on the DASH eating plan, aim to eat more fresh fruits and vegetables, whole grains, lean proteins, low-fat dairy, and heart-healthy fats. With the DASH eating plan, you should limit salt (sodium) intake  to 2,300 mg a day. If you have hypertension, you may need to reduce your sodium intake to 1,500 mg a day. Work with your health care provider or dietitian to adjust your eating plan to your individual calorie needs. This information is not intended to replace advice given to you by your health care provider. Make sure you discuss any questions you have with your health care provider. Document Revised: 10/16/2019 Document Reviewed: 10/16/2019 Elsevier Patient Education  2023 [employer].

## 2022-05-10 NOTE — Progress Notes (Signed)
Subjective:   By signing my name below, I, Faith Webb, attest that this documentation has been prepared under the direction and in the presence of Faith Schultz, DO  05/10/2022      Patient ID: Faith Webb, female    DOB: [birth year], 81 y.o.   MRN: [id number]  Chief Complaint  Patient presents with   Hypertension    Pt states blood pressures have been running high. Pt states having headache, dizziness, and blurred vision. No SOB or chest pain     Hypertension Associated symptoms include headaches. Pertinent negatives include no chest pain or shortness of breath.   Patient is in today for a office visit.   Her blood pressure is running high off and on while at home. She is feeling occasional dizziness for a while and thinks it may be due to her blood pressure. She also experiencing headaches with the dizziness that is concentrated above her eyes. She also has an occasional cough. She denies having any chest pain or SOB. She is not feeling any dizziness nor headache at this time. She continues taking 25 mg hydrochlorothiazide daily PO, 25 mg metoprolol tartrate 2x daily PO.  BP Readings from Last 3 Encounters:  05/10/22 122/84  05/07/22 (!) 155/83  04/25/22 (!) 146/78   Pulse Readings from Last 3 Encounters:  05/10/22 80  05/07/22 73  04/25/22 81   She reports having diarrhea after eating sweet foods and occasionally after eating bacon and eggs during breakfast only. She gets mild cramping prior to her diarrhea starting.  She is due for vision care and is planning on scheduling an appointment. 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 She was told she did not need a procedure for her right eye unless her symptoms return.    Past Medical History:  Diagnosis Date   Allergy    Arthritis    Hyperlipidemia    Hypertension     Past Surgical History:  Procedure Laterality Date   ABDOMINAL HYSTERECTOMY     BREAST BIOPSY Bilateral    BREAST SURGERY     EYE SURGERY       Family History  Problem Relation Age of Onset   Diabetes Mother    Heart disease Mother    Hypertension Mother    Stroke Mother    Diabetes Father    Cancer Sister    Diabetes Sister    Diabetes Brother    Hyperlipidemia Brother    Stroke Brother    Diabetes Sister    Hyperlipidemia Sister    Hypertension Sister    Kidney disease Sister    Stroke Sister    Diabetes Sister    Hyperlipidemia Sister    Healthy Child     Social History   Socioeconomic History   Marital status: Widowed    Spouse name: Not on file   Number of children: 2   Years of education: Not on file   Highest education level: Bachelor's degree (e.g., BA, AB, BS)  Occupational History   Not on file  Tobacco Use   Smoking status: Never   Smokeless tobacco: Never  Vaping Use   Vaping Use: Never used  Substance and Sexual Activity   Alcohol use: Never   Drug use: Never   Sexual activity: Not on file  Other Topics Concern   Not on file  Social History Narrative   Pt lives alone in 1 story home, she has 2 children, right handed, she drinks coffee  daily, tea/ soda occasionally.   Social Determinants of Health   Financial Resource Strain: Low Risk  (05/03/2021)   Overall Financial Resource Strain (CARDIA)    Difficulty of Paying Living Expenses: Not hard at all  Food Insecurity: No Food Insecurity (05/03/2021)   Hunger Vital Sign    Worried About Running Out of Food in the Last Year: Never true    Ran Out of Food in the Last Year: Never true  Transportation Needs: No Transportation Needs (05/03/2021)   PRAPARE - Administrator, Civil Service (Medical): No    Lack of Transportation (Non-Medical): No  Physical Activity: Sufficiently Active (05/03/2021)   Exercise Vital Sign    Days of Exercise per Week: 3 days    Minutes of Exercise per Session: 60 min  Stress: No Stress Concern Present (05/03/2021)   [employer] of Occupational Health - Occupational Stress Questionnaire    Feeling  of Stress : Not at all  Social Connections: Moderately Integrated (05/03/2021)   Social Connection and Isolation Panel [NHANES]    Frequency of Communication with Friends and Family: More than three times a week    Frequency of Social Gatherings with Friends and Family: More than three times a week    Attends Religious Services: More than 4 times per year    Active Member of [employer] or Organizations: Yes    Attends Banker Meetings: More than 4 times per year    Marital Status: Widowed  Intimate Partner Violence: Not At Risk (05/03/2021)   Humiliation, Afraid, Rape, and Kick questionnaire    Fear of Current or Ex-Partner: No    Emotionally Abused: No    Physically Abused: No    Sexually Abused: No    Outpatient Medications Prior to Visit  Medication Sig Dispense Refill   hydrochlorothiazide (HYDRODIURIL) 25 MG tablet TAKE 1 TABLET(25 MG) BY MOUTH DAILY 90 tablet 1   Magnesium Oxide 420 MG TABS Take 0.5 tablets by mouth daily.      meclizine (ANTIVERT) 12.5 MG tablet Take 1 tablet (12.5 mg total) by mouth 3 (three) times daily as needed for dizziness. 30 tablet 0   metoprolol tartrate (LOPRESSOR) 25 MG tablet TAKE 1 TABLET(25 MG) BY MOUTH TWICE DAILY 180 tablet 1   Omega-3 Fatty Acids (THE VERY FINEST FISH OIL) LIQD Take by mouth.     potassium chloride (KLOR-CON) 10 MEQ tablet TAKE 3 TABLETS(30 MEQ) BY MOUTH TWICE DAILY 270 tablet 1   rosuvastatin (CRESTOR) 10 MG tablet TAKE 1 TABLET(10 MG) BY MOUTH AT BEDTIME 90 tablet 1   Vitamin D, Ergocalciferol, (DRISDOL) 1.25 MG (50000 UNIT) CAPS capsule TAKE 1 CAPSULE BY MOUTH EVERY WEEK (Patient taking differently: 1,000 Units daily.) 12 capsule 3   No facility-administered medications prior to visit.    Allergies  Allergen Reactions   Amoxicillin Palpitations    Review of Systems  Respiratory:  Positive for cough (occasional). Negative for shortness of breath.   Cardiovascular:  Negative for chest pain.  Neurological:  Positive  for dizziness and headaches.       Objective:    Physical Exam Constitutional:      General: She is not in acute distress.    Appearance: Normal appearance. She is not ill-appearing.  HENT:     Head: Normocephalic and atraumatic.     Right Ear: External ear normal.     Left Ear: External ear normal.  Eyes:     Extraocular Movements: Extraocular movements intact.  Pupils: Pupils are equal, round, and reactive to light.  Cardiovascular:     Rate and Rhythm: Normal rate and regular rhythm.     Heart sounds: Normal heart sounds. No murmur heard.    No gallop.     Comments: Blood pressure measured 130/76 during recheck Pulmonary:     Effort: Pulmonary effort is normal. No respiratory distress.     Breath sounds: Normal breath sounds. No wheezing or rales.  Skin:    General: Skin is warm and dry.  Neurological:     Mental Status: She is alert and oriented to person, place, and time.  Psychiatric:        Judgment: Judgment normal.     BP 122/84 (BP Location: Left Arm, Patient Position: Sitting, Cuff Size: Normal)   Pulse 80   Temp 98.4 F (36.9 C) (Oral)   Resp 18   Ht 5\' 1"  (1.549 m)   Wt 144 lb 12.8 oz (65.7 kg)   SpO2 95%   BMI 27.36 kg/m  Wt Readings from Last 3 Encounters:  05/10/22 144 lb 12.8 oz (65.7 kg)  05/07/22 144 lb 9.6 oz (65.6 kg)  04/25/22 146 lb (66.2 kg)    Diabetic Foot Exam - Simple   No data filed    Lab Results  Component Value Date   WBC 4.2 08/09/2021   HGB 13.8 08/09/2021   HCT 40.9 08/09/2021   PLT 258 08/09/2021   GLUCOSE 84 08/09/2021   CHOL 122 06/27/2021   TRIG 119.0 06/27/2021   HDL 54.30 06/27/2021   LDLCALC 44 06/27/2021   ALT 14 06/27/2021   AST 12 06/27/2021   NA 139 08/09/2021   K 4.0 08/09/2021   CL 103 08/09/2021   CREATININE 0.96 08/09/2021   BUN 17 08/09/2021   CO2 28 08/09/2021   TSH 0.52 12/27/2020    Lab Results  Component Value Date   TSH 0.52 12/27/2020   Lab Results  Component Value Date   WBC  4.2 08/09/2021   HGB 13.8 08/09/2021   HCT 40.9 08/09/2021   MCV 89.3 08/09/2021   PLT 258 08/09/2021   Lab Results  Component Value Date   NA 139 08/09/2021   K 4.0 08/09/2021   CO2 28 08/09/2021   GLUCOSE 84 08/09/2021   BUN 17 08/09/2021   CREATININE 0.96 08/09/2021   BILITOT 0.6 06/27/2021   ALKPHOS 47 06/27/2021   AST 12 06/27/2021   ALT 14 06/27/2021   PROT 6.7 06/27/2021   ALBUMIN 4.2 06/27/2021   CALCIUM 9.3 08/09/2021   ANIONGAP 8 08/09/2021   GFR 42.35 (L) 06/27/2021   Lab Results  Component Value Date   CHOL 122 06/27/2021   Lab Results  Component Value Date   HDL 54.30 06/27/2021   Lab Results  Component Value Date   LDLCALC 44 06/27/2021   Lab Results  Component Value Date   TRIG 119.0 06/27/2021   Lab Results  Component Value Date   CHOLHDL 2 06/27/2021   No results found for: "HGBA1C"     Assessment & Plan:   Problem List Items Addressed This Visit       Cardiovascular and Mediastinum   Hypertension - Primary   Relevant Orders   CBC with Differential/Platelet   Comprehensive metabolic panel   Lipid panel   TSH   Vitamin B12   VITAMIN D 25 Hydroxy (Vit-D Deficiency, Fractures)     Other   Dizziness   Relevant Orders   CBC with Differential/Platelet  Comprehensive metabolic panel   Lipid panel   TSH   Vitamin B12   VITAMIN D 25 Hydroxy (Vit-D Deficiency, Fractures)     No orders of the defined types were placed in this encounter.   I, Faith Tyson Alias, personally preformed the services described in this documentation.  All medical record entries made by the scribe were at my direction and in my presence.  I have reviewed the chart and discharge instructions (if applicable) and agree that the record reflects my personal performance and is accurate and complete. 05/10/2022   I,Faith Webb,acting as a Neurosurgeon for [employer], DO.,have documented all relevant documentation on the behalf of Faith Schultz, DO,as  directed by  Faith Schultz, DO while in the presence of Faith Schultz, DO.   Faith [employer]

## 2022-05-11 LAB — CBC WITH DIFFERENTIAL/PLATELET
Basophils Absolute: 0 10*3/uL (ref 0.0–0.1)
Basophils Relative: 0.9 % (ref 0.0–3.0)
Eosinophils Absolute: 0.1 10*3/uL (ref 0.0–0.7)
Eosinophils Relative: 3.1 % (ref 0.0–5.0)
HCT: 38.9 % (ref 36.0–46.0)
Hemoglobin: 13 g/dL (ref 12.0–15.0)
Lymphocytes Relative: 36.4 % (ref 12.0–46.0)
Lymphs Abs: 1.7 10*3/uL (ref 0.7–4.0)
MCHC: 33.5 g/dL (ref 30.0–36.0)
MCV: 89.7 fl (ref 78.0–100.0)
Monocytes Absolute: 0.6 10*3/uL (ref 0.1–1.0)
Monocytes Relative: 12 % (ref 3.0–12.0)
Neutro Abs: 2.2 10*3/uL (ref 1.4–7.7)
Neutrophils Relative %: 47.6 % (ref 43.0–77.0)
Platelets: 242 10*3/uL (ref 150.0–400.0)
RBC: 4.33 Mil/uL (ref 3.87–5.11)
RDW: 13.6 % (ref 11.5–15.5)
WBC: 4.6 10*3/uL (ref 4.0–10.5)

## 2022-05-11 LAB — COMPREHENSIVE METABOLIC PANEL
ALT: 14 U/L (ref 0–35)
AST: 12 U/L (ref 0–37)
Albumin: 4.2 g/dL (ref 3.5–5.2)
Alkaline Phosphatase: 45 U/L (ref 39–117)
BUN: 18 mg/dL (ref 6–23)
CO2: 30 mEq/L (ref 19–32)
Calcium: 9.5 mg/dL (ref 8.4–10.5)
Chloride: 103 mEq/L (ref 96–112)
Creatinine, Ser: 1.17 mg/dL (ref 0.40–1.20)
GFR: 43.83 mL/min — ABNORMAL LOW (ref >60.00)
Glucose, Bld: 110 mg/dL — ABNORMAL HIGH (ref 70–99)
Potassium: 3.6 mEq/L (ref 3.5–5.1)
Sodium: 142 mEq/L (ref 135–145)
Total Bilirubin: 0.5 mg/dL (ref 0.2–1.2)
Total Protein: 6.7 g/dL (ref 6.0–8.3)

## 2022-05-11 LAB — LIPID PANEL
Cholesterol: 140 mg/dL (ref 0–200)
HDL: 60.2 mg/dL (ref >39.00)
LDL Cholesterol: 58 mg/dL (ref 0–99)
NonHDL: 79.78
Total CHOL/HDL Ratio: 2
Triglycerides: 108 mg/dL (ref 0.0–149.0)
VLDL: 21.6 mg/dL (ref 0.0–40.0)

## 2022-05-11 LAB — TSH: TSH: 0.42 u[IU]/mL (ref 0.35–5.50)

## 2022-05-11 LAB — VITAMIN D 25 HYDROXY (VIT D DEFICIENCY, FRACTURES): VITD: 50.66 ng/mL (ref 30.00–100.00)

## 2022-05-11 LAB — VITAMIN B12: Vitamin B-12: 346 pg/mL (ref 211–911)

## 2022-05-15 ENCOUNTER — Other Ambulatory Visit (HOSPITAL_BASED_OUTPATIENT_CLINIC_OR_DEPARTMENT_OTHER): Payer: Medicare PPO

## 2022-05-15 ENCOUNTER — Ambulatory Visit (HOSPITAL_BASED_OUTPATIENT_CLINIC_OR_DEPARTMENT_OTHER): Payer: Medicare PPO

## 2022-05-16 NOTE — Assessment & Plan Note (Signed)
Well controlled, no changes to meds. Encouraged heart healthy diet such as the DASH diet and exercise as tolerated.  °

## 2022-05-16 NOTE — Assessment & Plan Note (Signed)
Chronic ----- neuro eval neg Consider ent and or vestibular Pt

## 2022-05-24 ENCOUNTER — Other Ambulatory Visit: Payer: Self-pay | Admitting: Family Medicine

## 2022-06-04 ENCOUNTER — Encounter (HOSPITAL_BASED_OUTPATIENT_CLINIC_OR_DEPARTMENT_OTHER): Payer: Self-pay

## 2022-06-04 ENCOUNTER — Ambulatory Visit (HOSPITAL_BASED_OUTPATIENT_CLINIC_OR_DEPARTMENT_OTHER)
Admission: RE | Admit: 2022-06-04 | Discharge: 2022-06-04 | Disposition: A | Discharge status: Home / Self Care | Payer: Medicare PPO | Source: Ambulatory Visit | Attending: Family Medicine | Admitting: Family Medicine

## 2022-06-04 DIAGNOSIS — Z780 Asymptomatic menopausal state: Secondary | ICD-10-CM | POA: Insufficient documentation

## 2022-06-04 DIAGNOSIS — Z1231 Encounter for screening mammogram for malignant neoplasm of breast: Secondary | ICD-10-CM | POA: Diagnosis not present

## 2022-06-12 ENCOUNTER — Ambulatory Visit: Payer: Medicare PPO | Admitting: Family Medicine

## 2022-06-12 ENCOUNTER — Encounter: Payer: Self-pay | Admitting: Family Medicine

## 2022-06-12 VITALS — BP 108/80 | HR 69 | Temp 97.8°F | Resp 18 | Ht 61.0 in | Wt 146.4 lb

## 2022-06-12 DIAGNOSIS — I10 Essential (primary) hypertension: Secondary | ICD-10-CM

## 2022-06-12 NOTE — Progress Notes (Signed)
Subjective:   By signing my name below, I, [name], attest that this documentation has been prepared under the direction and in the presence of Ann Held, DO. 06/12/2022     Patient ID: Faith Webb, female    DOB: 06/03/1941, 81 y.o.   MRN: [id number]  Chief Complaint  Patient presents with   Hypertension   Follow-up    Hypertension Associated symptoms include blurred vision (mild). Pertinent negatives include no chest pain, headaches, malaise/fatigue, palpitations or shortness of breath.   Patient is in today for a follow up visit.   Her blood pressure is low during this visit. She does not check her blood pressure at home. She is currently experiencing mild blurry vision. She also has mild dizziness that comes and goes. She is taking 12.5 mg meclizine to manage her dizziness. She is also applying an OTC treatment above her ears to help manage her dizziness and reports mild relief while taking it. She continues taking 25 mg hydrochlorothiazide daily PO, 25 mg metoprolol tartrate daily PO and reports no new issues while taking them. She is willing to stop taking hydrochlorothiazide to help manage her symptoms.  BP Readings from Last 3 Encounters:  06/12/22 108/80  05/10/22 130/76  05/07/22 (!) 155/83   Pulse Readings from Last 3 Encounters:  06/12/22 69  05/10/22 80  05/07/22 73    Past Medical History:  Diagnosis Date   Allergy    Arthritis    Hyperlipidemia    Hypertension     Past Surgical History:  Procedure Laterality Date   ABDOMINAL HYSTERECTOMY     BREAST BIOPSY Bilateral    BREAST SURGERY     EYE SURGERY      Family History  Problem Relation Age of Onset   Diabetes Mother    Heart disease Mother    Hypertension Mother    Stroke Mother    Diabetes Father    Cancer Sister    Diabetes Sister    Diabetes Brother    Hyperlipidemia Brother    Stroke Brother    Diabetes Sister    Hyperlipidemia Sister    Hypertension Sister    Kidney  disease Sister    Stroke Sister    Diabetes Sister    Hyperlipidemia Sister    Healthy Child     Social History   Socioeconomic History   Marital status: Widowed    Spouse name: Not on file   Number of children: 2   Years of education: Not on file   Highest education level: Bachelor's degree (e.g., BA, AB, BS)  Occupational History   Not on file  Tobacco Use   Smoking status: Never   Smokeless tobacco: Never  Vaping Use   Vaping Use: Never used  Substance and Sexual Activity   Alcohol use: Never   Drug use: Never   Sexual activity: Not on file  Other Topics Concern   Not on file  Social History Narrative   Pt lives alone in 1 story home, she has 2 children, right handed, she drinks coffee daily, tea/ soda occasionally.   Social Determinants of Health   Financial Resource Strain: Low Risk  (05/03/2021)   Overall Financial Resource Strain (CARDIA)    Difficulty of Paying Living Expenses: Not hard at all  Food Insecurity: No Food Insecurity (05/03/2021)   Hunger Vital Sign    Worried About Running Out of Food in the Last Year: Never true    Ran Out of  Food in the Last Year: Never true  Transportation Needs: No Transportation Needs (05/03/2021)   PRAPARE - Administrator, Civil Service (Medical): No    Lack of Transportation (Non-Medical): No  Physical Activity: Sufficiently Active (05/03/2021)   Exercise Vital Sign    Days of Exercise per Week: 3 days    Minutes of Exercise per Session: 60 min  Stress: No Stress Concern Present (05/03/2021)   [employer] of Occupational Health - Occupational Stress Questionnaire    Feeling of Stress : Not at all  Social Connections: Moderately Integrated (05/03/2021)   Social Connection and Isolation Panel [NHANES]    Frequency of Communication with Friends and Family: More than three times a week    Frequency of Social Gatherings with Friends and Family: More than three times a week    Attends Religious Services: More than 4  times per year    Active Member of [employer] or Organizations: Yes    Attends Banker Meetings: More than 4 times per year    Marital Status: Widowed  Intimate Partner Violence: Not At Risk (05/03/2021)   Humiliation, Afraid, Rape, and Kick questionnaire    Fear of Current or Ex-Partner: No    Emotionally Abused: No    Physically Abused: No    Sexually Abused: No    Outpatient Medications Prior to Visit  Medication Sig Dispense Refill   Magnesium Oxide 420 MG TABS Take 0.5 tablets by mouth daily.      meclizine (ANTIVERT) 12.5 MG tablet Take 1 tablet (12.5 mg total) by mouth 3 (three) times daily as needed for dizziness. 30 tablet 0   metoprolol tartrate (LOPRESSOR) 25 MG tablet TAKE 1 TABLET(25 MG) BY MOUTH TWICE DAILY 180 tablet 1   Omega-3 Fatty Acids (THE VERY FINEST FISH OIL) LIQD Take by mouth.     potassium chloride (KLOR-CON) 10 MEQ tablet TAKE 3 TABLETS(30 MEQ) BY MOUTH TWICE DAILY 270 tablet 1   rosuvastatin (CRESTOR) 10 MG tablet TAKE 1 TABLET(10 MG) BY MOUTH AT BEDTIME 90 tablet 1   Vitamin D, Ergocalciferol, (DRISDOL) 1.25 MG (50000 UNIT) CAPS capsule TAKE 1 CAPSULE BY MOUTH EVERY WEEK (Patient taking differently: 1,000 Units daily.) 12 capsule 3   hydrochlorothiazide (HYDRODIURIL) 25 MG tablet TAKE 1 TABLET(25 MG) BY MOUTH DAILY 90 tablet 1   No facility-administered medications prior to visit.    Allergies  Allergen Reactions   Amoxicillin Palpitations    Review of Systems  Constitutional:  Negative for fever and malaise/fatigue.  HENT:  Negative for congestion.   Eyes:  Positive for blurred vision (mild).  Respiratory:  Negative for shortness of breath.   Cardiovascular:  Negative for chest pain, palpitations and leg swelling.  Gastrointestinal:  Negative for abdominal pain, blood in stool and nausea.  Genitourinary:  Negative for dysuria and frequency.  Musculoskeletal:  Negative for falls.  Skin:  Negative for rash.  Neurological:  Positive for  dizziness. Negative for loss of consciousness and headaches.  Endo/Heme/Allergies:  Negative for environmental allergies.  Psychiatric/Behavioral:  Negative for depression. The patient is not nervous/anxious.        Objective:    Physical Exam Vitals and nursing note reviewed.  Constitutional:      General: She is not in acute distress.    Appearance: Normal appearance. She is not ill-appearing.  HENT:     Head: Normocephalic and atraumatic.     Right Ear: External ear normal. There is impacted cerumen.  Left Ear: Tympanic membrane, ear canal and external ear normal.  Eyes:     Extraocular Movements: Extraocular movements intact.     Pupils: Pupils are equal, round, and reactive to light.  Cardiovascular:     Rate and Rhythm: Normal rate and regular rhythm.     Heart sounds: Normal heart sounds. No murmur heard.    No gallop.  Pulmonary:     Effort: Pulmonary effort is normal. No respiratory distress.     Breath sounds: Normal breath sounds. No wheezing or rales.  Skin:    General: Skin is warm and dry.  Neurological:     Mental Status: She is alert and oriented to person, place, and time.  Psychiatric:        Mood and Affect: Mood normal.        Behavior: Behavior normal.        Thought Content: Thought content normal.        Judgment: Judgment normal.     BP 108/80 (BP Location: Left Arm, Patient Position: Sitting, Cuff Size: Normal)   Pulse 69   Temp 97.8 F (36.6 C) (Oral)   Resp 18   Ht 5\' 1"  (1.549 m)   Wt 146 lb 6.4 oz (66.4 kg)   SpO2 97%   BMI 27.66 kg/m  Wt Readings from Last 3 Encounters:  06/12/22 146 lb 6.4 oz (66.4 kg)  05/10/22 144 lb 12.8 oz (65.7 kg)  05/07/22 144 lb 9.6 oz (65.6 kg)    Diabetic Foot Exam - Simple   No data filed    Lab Results  Component Value Date   WBC 4.6 05/10/2022   HGB 13.0 05/10/2022   HCT 38.9 05/10/2022   PLT 242.0 05/10/2022   GLUCOSE 110 (H) 05/10/2022   CHOL 140 05/10/2022   TRIG 108.0 05/10/2022    HDL 60.20 05/10/2022   LDLCALC 58 05/10/2022   ALT 14 05/10/2022   AST 12 05/10/2022   NA 142 05/10/2022   K 3.6 05/10/2022   CL 103 05/10/2022   CREATININE 1.17 05/10/2022   BUN 18 05/10/2022   CO2 30 05/10/2022   TSH 0.42 05/10/2022    Lab Results  Component Value Date   TSH 0.42 05/10/2022   Lab Results  Component Value Date   WBC 4.6 05/10/2022   HGB 13.0 05/10/2022   HCT 38.9 05/10/2022   MCV 89.7 05/10/2022   PLT 242.0 05/10/2022   Lab Results  Component Value Date   NA 142 05/10/2022   K 3.6 05/10/2022   CO2 30 05/10/2022   GLUCOSE 110 (H) 05/10/2022   BUN 18 05/10/2022   CREATININE 1.17 05/10/2022   BILITOT 0.5 05/10/2022   ALKPHOS 45 05/10/2022   AST 12 05/10/2022   ALT 14 05/10/2022   PROT 6.7 05/10/2022   ALBUMIN 4.2 05/10/2022   CALCIUM 9.5 05/10/2022   ANIONGAP 8 08/09/2021   GFR 43.83 (L) 05/10/2022   Lab Results  Component Value Date   CHOL 140 05/10/2022   Lab Results  Component Value Date   HDL 60.20 05/10/2022   Lab Results  Component Value Date   LDLCALC 58 05/10/2022   Lab Results  Component Value Date   TRIG 108.0 05/10/2022   Lab Results  Component Value Date   CHOLHDL 2 05/10/2022   No results found for: "HGBA1C"     Assessment & Plan:   Problem List Items Addressed This Visit       Unprioritized   Hypertension - Primary  Well controlled, no changes to meds. Encouraged heart healthy diet such as the DASH diet and exercise as tolerated.         No orders of the defined types were placed in this encounter.   IDonato Schultz, DO, personally preformed the services described in this documentation.  All medical record entries made by the scribe were at my direction and in my presence.  I have reviewed the chart and discharge instructions (if applicable) and agree that the record reflects my personal performance and is accurate and complete. 06/12/2022   I,[name],acting as a Neurosurgeon for [employer], DO.,have documented all relevant documentation on the behalf of [name], DO,as directed by  [name], DO while in the presence of [name], DO.   [name], DO

## 2022-06-12 NOTE — Patient Instructions (Signed)

## 2022-06-12 NOTE — Assessment & Plan Note (Signed)
Well controlled, no changes to meds. Encouraged heart healthy diet such as the DASH diet and exercise as tolerated.  °

## 2022-06-15 ENCOUNTER — Telehealth: Payer: Self-pay

## 2022-06-15 NOTE — Telephone Encounter (Signed)
Would rec continuing to monitor BP at home and follow up with Dr. Laury Axon next week w her readings if still concerned. Ty.

## 2022-06-15 NOTE — Telephone Encounter (Signed)
Patient notified.  She declined appointment for next week and will see how she does over the weekend.  Advised is she has any chest pains, shortness of breath, or any other severe symptoms to go to ER.

## 2022-06-15 NOTE — Telephone Encounter (Signed)
Pt called in states that she was taken off the HCTZ and her bp has been higher. Pt would like to know if she should start back taking it again.

## 2022-06-27 DIAGNOSIS — R42 Dizziness and giddiness: Secondary | ICD-10-CM | POA: Diagnosis not present

## 2022-06-27 DIAGNOSIS — E876 Hypokalemia: Secondary | ICD-10-CM | POA: Diagnosis not present

## 2022-06-27 DIAGNOSIS — Z823 Family history of stroke: Secondary | ICD-10-CM | POA: Diagnosis not present

## 2022-06-27 DIAGNOSIS — I10 Essential (primary) hypertension: Secondary | ICD-10-CM | POA: Diagnosis not present

## 2022-06-27 DIAGNOSIS — M1990 Unspecified osteoarthritis, unspecified site: Secondary | ICD-10-CM | POA: Diagnosis not present

## 2022-06-27 DIAGNOSIS — Z803 Family history of malignant neoplasm of breast: Secondary | ICD-10-CM | POA: Diagnosis not present

## 2022-06-27 DIAGNOSIS — Z8249 Family history of ischemic heart disease and other diseases of the circulatory system: Secondary | ICD-10-CM | POA: Diagnosis not present

## 2022-06-27 DIAGNOSIS — E785 Hyperlipidemia, unspecified: Secondary | ICD-10-CM | POA: Diagnosis not present

## 2022-07-09 DIAGNOSIS — I10 Essential (primary) hypertension: Secondary | ICD-10-CM | POA: Diagnosis not present

## 2022-07-09 DIAGNOSIS — E663 Overweight: Secondary | ICD-10-CM | POA: Diagnosis not present

## 2022-07-09 DIAGNOSIS — Z6827 Body mass index (BMI) 27.0-27.9, adult: Secondary | ICD-10-CM | POA: Diagnosis not present

## 2022-07-09 DIAGNOSIS — E785 Hyperlipidemia, unspecified: Secondary | ICD-10-CM | POA: Diagnosis not present

## 2022-07-09 DIAGNOSIS — M7500 Adhesive capsulitis of unspecified shoulder: Secondary | ICD-10-CM | POA: Diagnosis not present

## 2022-07-09 DIAGNOSIS — J309 Allergic rhinitis, unspecified: Secondary | ICD-10-CM | POA: Diagnosis not present

## 2022-07-09 DIAGNOSIS — I720 Aneurysm of carotid artery: Secondary | ICD-10-CM | POA: Diagnosis not present

## 2022-07-24 DIAGNOSIS — I720 Aneurysm of carotid artery: Secondary | ICD-10-CM | POA: Diagnosis not present

## 2022-07-24 DIAGNOSIS — Z6827 Body mass index (BMI) 27.0-27.9, adult: Secondary | ICD-10-CM | POA: Diagnosis not present

## 2022-07-24 DIAGNOSIS — I10 Essential (primary) hypertension: Secondary | ICD-10-CM | POA: Diagnosis not present

## 2022-07-24 DIAGNOSIS — R42 Dizziness and giddiness: Secondary | ICD-10-CM | POA: Diagnosis not present

## 2022-07-24 DIAGNOSIS — E663 Overweight: Secondary | ICD-10-CM | POA: Diagnosis not present

## 2022-08-13 ENCOUNTER — Other Ambulatory Visit: Payer: Self-pay | Admitting: Family Medicine

## 2022-08-28 DIAGNOSIS — Z6826 Body mass index (BMI) 26.0-26.9, adult: Secondary | ICD-10-CM | POA: Diagnosis not present

## 2022-08-28 DIAGNOSIS — E663 Overweight: Secondary | ICD-10-CM | POA: Diagnosis not present

## 2022-08-28 DIAGNOSIS — Z23 Encounter for immunization: Secondary | ICD-10-CM | POA: Diagnosis not present

## 2022-08-28 DIAGNOSIS — I10 Essential (primary) hypertension: Secondary | ICD-10-CM | POA: Diagnosis not present

## 2022-09-04 ENCOUNTER — Other Ambulatory Visit: Payer: Self-pay | Admitting: Family Medicine

## 2022-09-04 DIAGNOSIS — H81399 Other peripheral vertigo, unspecified ear: Secondary | ICD-10-CM

## 2022-09-10 IMAGING — DX DG KNEE COMPLETE 4+V*L*
4 series · 4 of 4 positions shown · non-contrast
Comparison: August 24, 2017.

CLINICAL DATA: Chronic left knee pain

EXAM:
LEFT KNEE - COMPLETE 4+ VIEW

[knee ap]
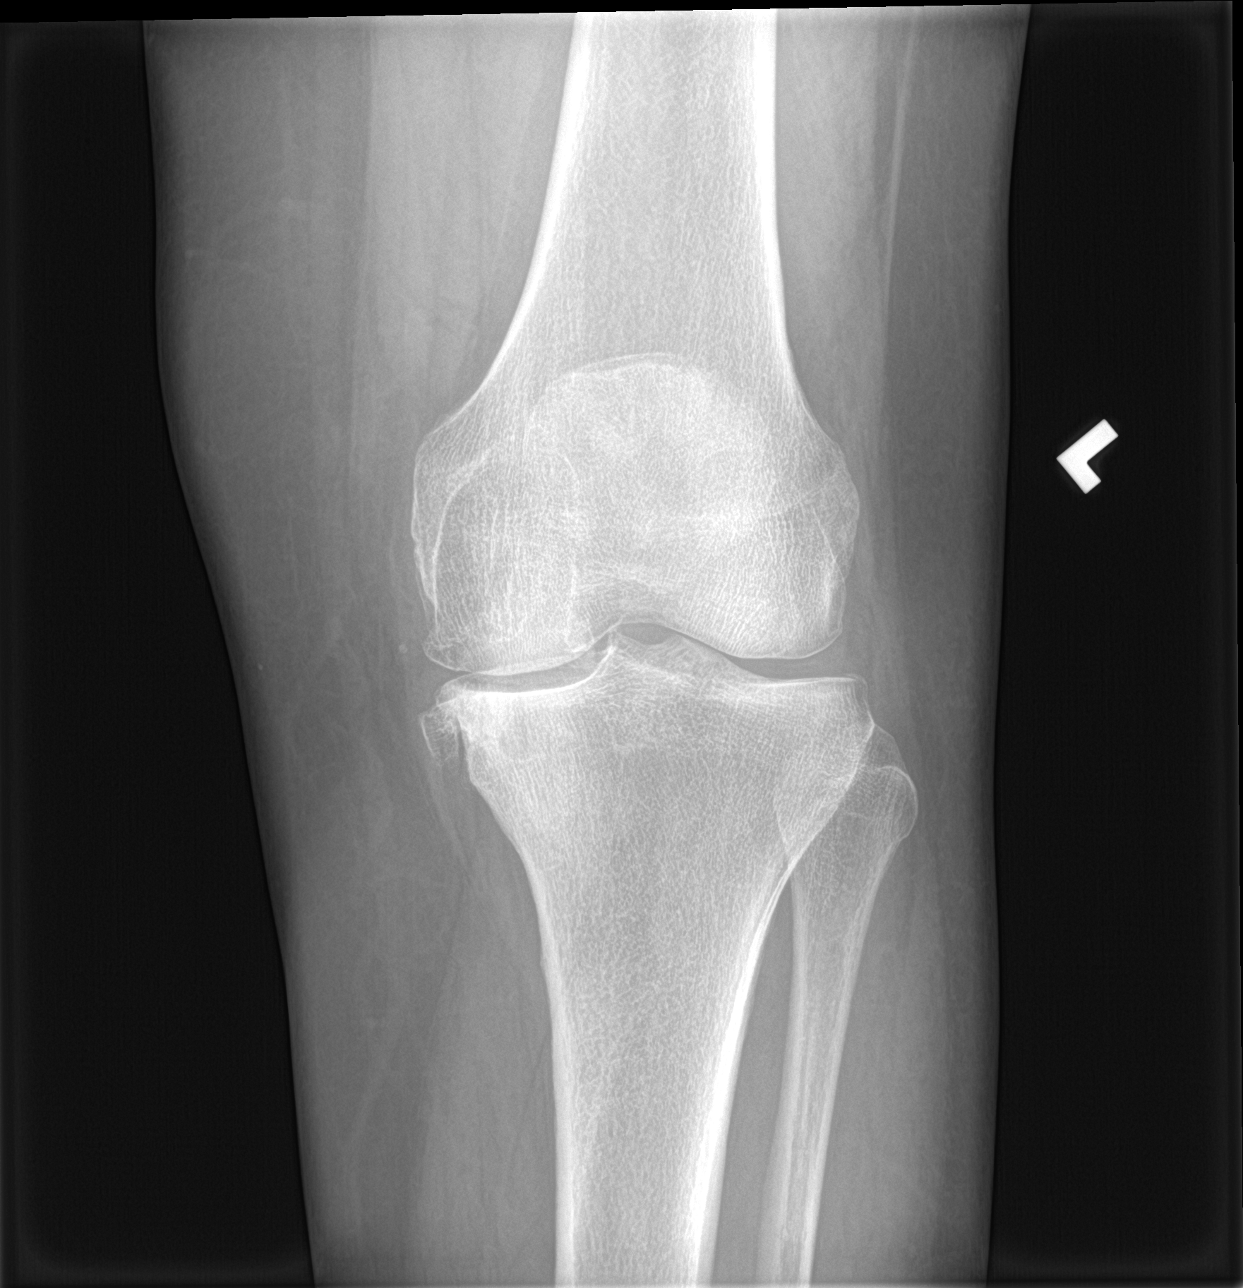

[knee lat]
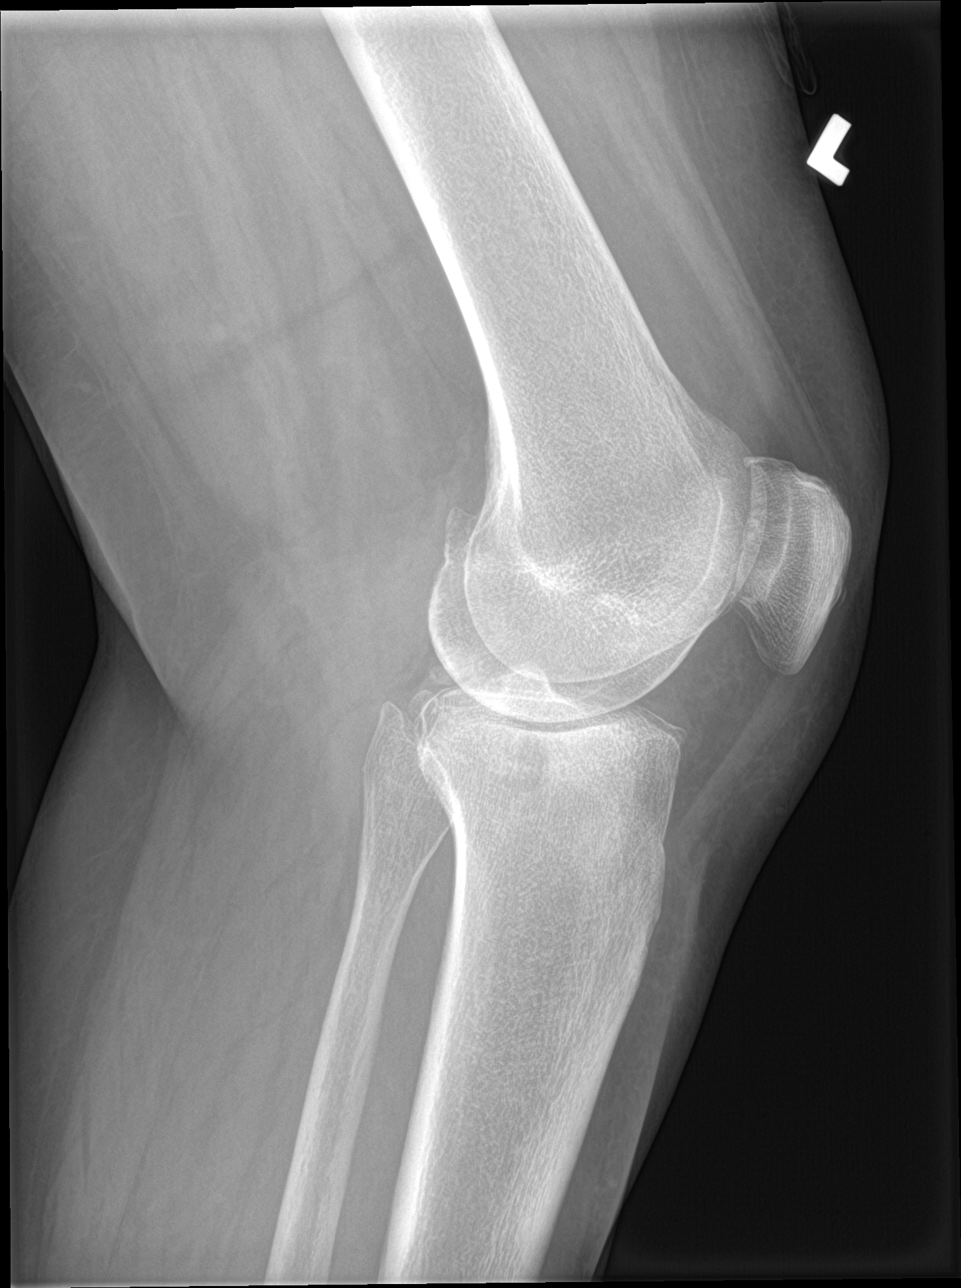

[knee obl (1 of 2)]
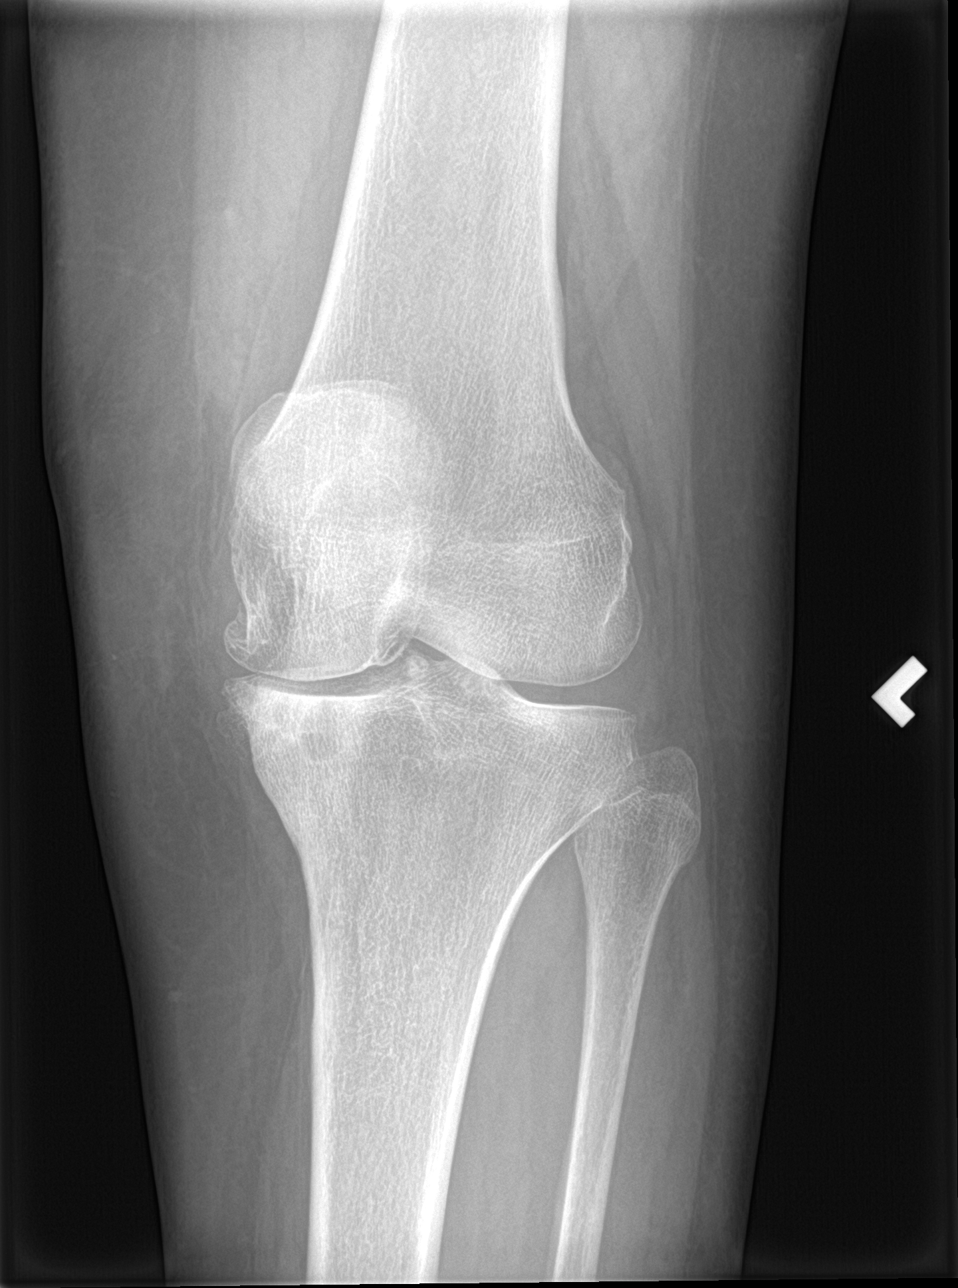

[knee obl (2 of 2)]
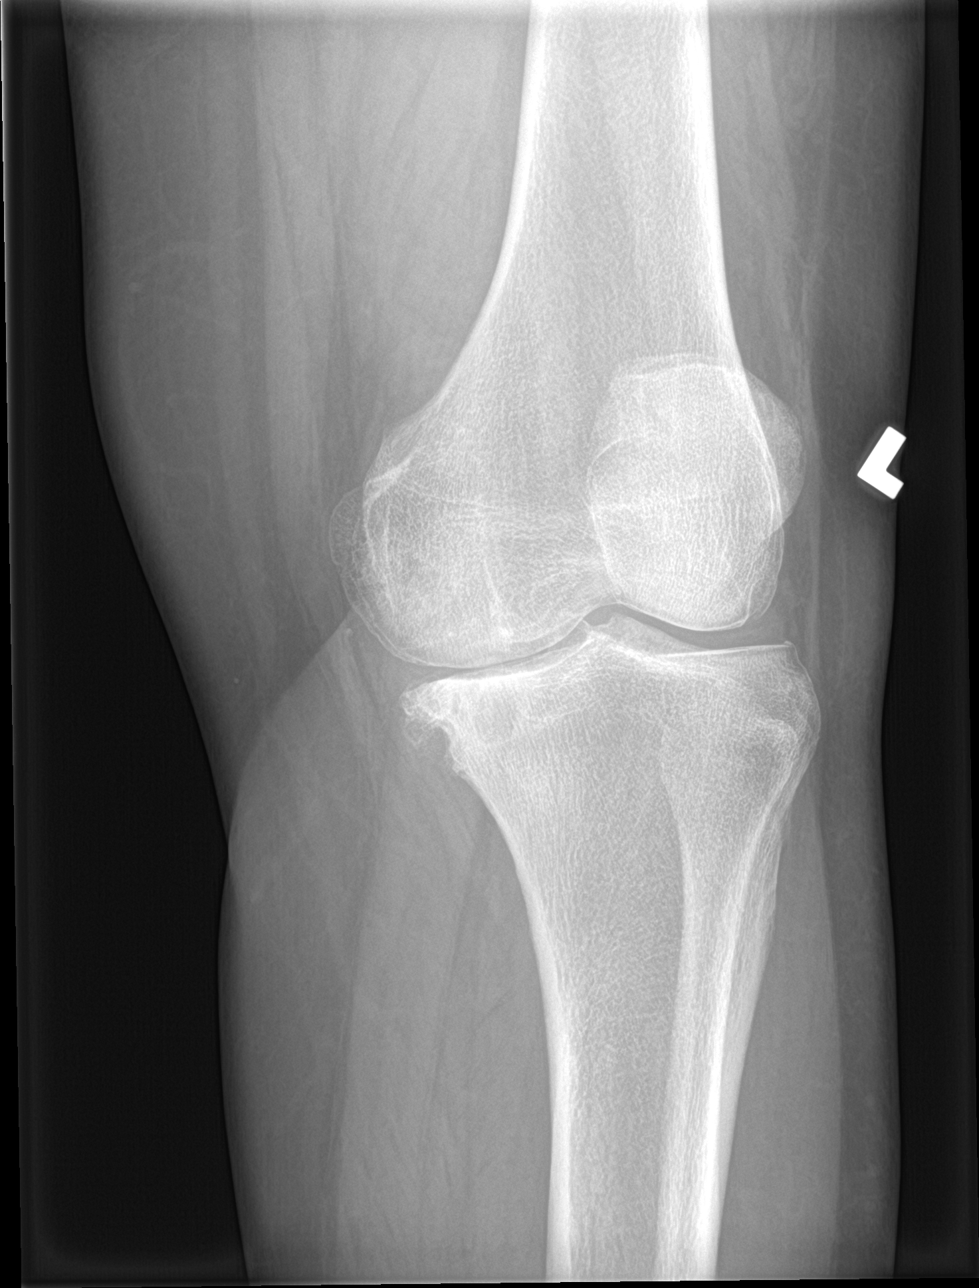

[4 of 4 positions shown; findings below may reference images not displayed]

FINDINGS: No evidence of fracture, dislocation, or joint effusion. Stable
tricompartment degenerative change, moderate medial tibiofemoral,
mild in the patellofemoral and minimal in the lateral tibiofemoral
compartment. Soft tissues are unremarkable.
IMPRESSION: Stable degenerative change.  No acute osseous abnormality.

## 2022-10-03 DIAGNOSIS — Z79899 Other long term (current) drug therapy: Secondary | ICD-10-CM | POA: Diagnosis not present

## 2022-10-09 ENCOUNTER — Other Ambulatory Visit: Payer: Self-pay | Admitting: Family Medicine

## 2022-10-09 DIAGNOSIS — E785 Hyperlipidemia, unspecified: Secondary | ICD-10-CM

## 2022-10-09 DIAGNOSIS — I10 Essential (primary) hypertension: Secondary | ICD-10-CM

## 2022-10-12 DIAGNOSIS — Z0001 Encounter for general adult medical examination with abnormal findings: Secondary | ICD-10-CM | POA: Diagnosis not present

## 2022-10-12 DIAGNOSIS — R5383 Other fatigue: Secondary | ICD-10-CM | POA: Diagnosis not present

## 2022-10-12 DIAGNOSIS — Z6827 Body mass index (BMI) 27.0-27.9, adult: Secondary | ICD-10-CM | POA: Diagnosis not present

## 2022-10-12 DIAGNOSIS — E559 Vitamin D deficiency, unspecified: Secondary | ICD-10-CM | POA: Diagnosis not present

## 2022-10-12 DIAGNOSIS — E663 Overweight: Secondary | ICD-10-CM | POA: Diagnosis not present

## 2022-10-25 DIAGNOSIS — H02423 Myogenic ptosis of bilateral eyelids: Secondary | ICD-10-CM | POA: Diagnosis not present

## 2022-10-25 DIAGNOSIS — H25811 Combined forms of age-related cataract, right eye: Secondary | ICD-10-CM | POA: Diagnosis not present

## 2022-10-25 DIAGNOSIS — H43813 Vitreous degeneration, bilateral: Secondary | ICD-10-CM | POA: Diagnosis not present

## 2022-10-25 DIAGNOSIS — H04123 Dry eye syndrome of bilateral lacrimal glands: Secondary | ICD-10-CM | POA: Diagnosis not present

## 2022-12-13 ENCOUNTER — Ambulatory Visit: Payer: Medicare PPO | Admitting: Family Medicine

## 2022-12-13 ENCOUNTER — Encounter: Payer: Self-pay | Admitting: Family Medicine

## 2022-12-13 VITALS — BP 124/82 | HR 80 | Temp 98.6°F | Resp 18 | Ht 61.0 in | Wt 142.6 lb

## 2022-12-13 DIAGNOSIS — E785 Hyperlipidemia, unspecified: Secondary | ICD-10-CM | POA: Diagnosis not present

## 2022-12-13 DIAGNOSIS — I10 Essential (primary) hypertension: Secondary | ICD-10-CM | POA: Diagnosis not present

## 2022-12-13 LAB — LIPID PANEL
Cholesterol: 133 mg/dL (ref 0–200)
HDL: 60.2 mg/dL (ref >39.00)
LDL Cholesterol: 47 mg/dL (ref 0–99)
NonHDL: 72.49
Total CHOL/HDL Ratio: 2
Triglycerides: 126 mg/dL (ref 0.0–149.0)
VLDL: 25.2 mg/dL (ref 0.0–40.0)

## 2022-12-13 NOTE — Progress Notes (Signed)
Subjective:   By signing my name below, I, [name], attest that this documentation has been prepared under the direction and in the presence of Ann Held, DO. 12/13/2022   Patient ID: Faith Webb, female    DOB: 1941-07-05, 82 y.o.   MRN: [id number]  Chief Complaint  Patient presents with   Hypertension   Follow-up    Hypertension Pertinent negatives include no blurred vision, chest pain, headaches, malaise/fatigue, palpitations or shortness of breath.   Patient is in today for a follow up visit.  She reports receiving blood work and results were normal. She did not complete a lipid panel during her last blood work.  She reports her brother passed away in 29-Oct-2022 to cancer from an unknown origin in his liver.  She is UTD on medication refills.  She is UTD on flu vaccines. She is interested in receiving the latest Covid-19 booster vaccine.   Past Medical History:  Diagnosis Date   Allergy    Arthritis    Hyperlipidemia    Hypertension     Past Surgical History:  Procedure Laterality Date   ABDOMINAL HYSTERECTOMY     BREAST BIOPSY Bilateral    BREAST SURGERY     EYE SURGERY      Family History  Problem Relation Age of Onset   Diabetes Mother    Heart disease Mother    Hypertension Mother    Stroke Mother    Diabetes Father    Cancer Sister    Diabetes Sister    Diabetes Brother    Hyperlipidemia Brother    Stroke Brother    Diabetes Sister    Hyperlipidemia Sister    Hypertension Sister    Kidney disease Sister    Stroke Sister    Diabetes Sister    Hyperlipidemia Sister    Healthy Child     Social History   Socioeconomic History   Marital status: Widowed    Spouse name: Not on file   Number of children: 2   Years of education: Not on file   Highest education level: Bachelor's degree (e.g., BA, AB, BS)  Occupational History   Not on file  Tobacco Use   Smoking status: Never   Smokeless tobacco: Never  Vaping Use    Vaping Use: Never used  Substance and Sexual Activity   Alcohol use: Never   Drug use: Never   Sexual activity: Not on file  Other Topics Concern   Not on file  Social History Narrative   Pt lives alone in 1 story home, she has 2 children, right handed, she drinks coffee daily, tea/ soda occasionally.   Social Determinants of Health   Financial Resource Strain: Low Risk  (05/03/2021)   Overall Financial Resource Strain (CARDIA)    Difficulty of Paying Living Expenses: Not hard at all  Food Insecurity: No Food Insecurity (05/03/2021)   Hunger Vital Sign    Worried About Running Out of Food in the Last Year: Never true    Ran Out of Food in the Last Year: Never true  Transportation Needs: No Transportation Needs (05/03/2021)   PRAPARE - Hydrologist (Medical): No    Lack of Transportation (Non-Medical): No  Physical Activity: Sufficiently Active (05/03/2021)   Exercise Vital Sign    Days of Exercise per Week: 3 days    Minutes of Exercise per Session: 60 min  Stress: No Stress Concern Present (05/03/2021)   [employer]  of Occupational Health - Occupational Stress Questionnaire    Feeling of Stress : Not at all  Social Connections: Moderately Integrated (05/03/2021)   Social Connection and Isolation Panel [NHANES]    Frequency of Communication with Friends and Family: More than three times a week    Frequency of Social Gatherings with Friends and Family: More than three times a week    Attends Religious Services: More than 4 times per year    Active Member of [employer] or Organizations: Yes    Attends Archivist Meetings: More than 4 times per year    Marital Status: Widowed  Intimate Partner Violence: Not At Risk (05/03/2021)   Humiliation, Afraid, Rape, and Kick questionnaire    Fear of Current or Ex-Partner: No    Emotionally Abused: No    Physically Abused: No    Sexually Abused: No    Outpatient Medications Prior to Visit  Medication Sig  Dispense Refill   Magnesium Oxide 420 MG TABS Take 0.5 tablets by mouth daily.      meclizine (ANTIVERT) 12.5 MG tablet TAKE 1 TABLET(12.5 MG) BY MOUTH THREE TIMES DAILY AS NEEDED FOR DIZZINESS 30 tablet 0   metoprolol tartrate (LOPRESSOR) 25 MG tablet TAKE 1 TABLET(25 MG) BY MOUTH TWICE DAILY 180 tablet 1   Omega-3 Fatty Acids (THE VERY FINEST FISH OIL) LIQD Take by mouth.     potassium chloride (KLOR-CON) 10 MEQ tablet TAKE 3 TABLETS(30 MEQ) BY MOUTH TWICE DAILY 270 tablet 1   rosuvastatin (CRESTOR) 10 MG tablet TAKE 1 TABLET(10 MG) BY MOUTH AT BEDTIME 90 tablet 1   Vitamin D, Ergocalciferol, (DRISDOL) 1.25 MG (50000 UNIT) CAPS capsule TAKE 1 CAPSULE BY MOUTH EVERY WEEK (Patient taking differently: 1,000 Units daily.) 12 capsule 3   No facility-administered medications prior to visit.    Allergies  Allergen Reactions   Amoxicillin Palpitations    Review of Systems  Constitutional:  Negative for fever and malaise/fatigue.  HENT:  Negative for congestion.   Eyes:  Negative for blurred vision.  Respiratory:  Negative for cough and shortness of breath.   Cardiovascular:  Negative for chest pain, palpitations and leg swelling.  Gastrointestinal:  Negative for vomiting.  Musculoskeletal:  Negative for back pain.  Skin:  Negative for rash.  Neurological:  Negative for loss of consciousness and headaches.       Objective:    Physical Exam Vitals and nursing note reviewed.  Constitutional:      Appearance: She is well-developed.  HENT:     Head: Normocephalic and atraumatic.  Eyes:     Conjunctiva/sclera: Conjunctivae normal.  Neck:     Thyroid: No thyromegaly.     Vascular: No carotid bruit or JVD.  Cardiovascular:     Rate and Rhythm: Normal rate and regular rhythm.     Heart sounds: Normal heart sounds. No murmur heard. Pulmonary:     Effort: Pulmonary effort is normal. No respiratory distress.     Breath sounds: Normal breath sounds. No wheezing or rales.  Chest:      Chest wall: No tenderness.  Musculoskeletal:     Cervical back: Normal range of motion and neck supple.  Neurological:     Mental Status: She is alert and oriented to person, place, and time.     BP 124/82 (BP Location: Left Arm, Patient Position: Sitting, Cuff Size: Normal)   Pulse 80   Temp 98.6 F (37 C) (Oral)   Resp 18   Ht 5\' 1"  (1.549  m)   Wt 142 lb 9.6 oz (64.7 kg)   SpO2 98%   BMI 26.94 kg/m  Wt Readings from Last 3 Encounters:  12/13/22 142 lb 9.6 oz (64.7 kg)  06/12/22 146 lb 6.4 oz (66.4 kg)  05/10/22 144 lb 12.8 oz (65.7 kg)       Assessment & Plan:  Hyperlipidemia, unspecified hyperlipidemia type -     Lipid panel  Primary hypertension Assessment & Plan: Well controlled, no changes to meds. Encouraged heart healthy diet such as the DASH diet and exercise as tolerated.       IDonato Schultz, DO, personally preformed the services described in this documentation.  All medical record entries made by the scribe were at my direction and in my presence.  I have reviewed the chart and discharge instructions (if applicable) and agree that the record reflects my personal performance and is accurate and complete. 12/13/2022   I,[name],acting as a scribe for [name], DO.,have documented all relevant documentation on the behalf of [name], DO,as directed by  [name], DO while in the presence of [name], DO.   [name], DO

## 2022-12-13 NOTE — Patient Instructions (Signed)

## 2022-12-14 NOTE — Assessment & Plan Note (Signed)
Well controlled, no changes to meds. Encouraged heart healthy diet such as the DASH diet and exercise as tolerated.  °

## 2023-02-12 ENCOUNTER — Other Ambulatory Visit: Payer: Self-pay | Admitting: *Deleted

## 2023-02-12 MED ORDER — METOPROLOL TARTRATE 25 MG PO TABS: ORAL_TABLET | ORAL | 1 refills | Status: DC

## 2023-03-11 ENCOUNTER — Encounter: Payer: Self-pay | Admitting: *Deleted

## 2023-04-28 ENCOUNTER — Other Ambulatory Visit: Payer: Self-pay | Admitting: Family Medicine

## 2023-04-28 DIAGNOSIS — E785 Hyperlipidemia, unspecified: Secondary | ICD-10-CM

## 2023-04-28 DIAGNOSIS — I10 Essential (primary) hypertension: Secondary | ICD-10-CM

## 2023-05-09 ENCOUNTER — Ambulatory Visit (INDEPENDENT_AMBULATORY_CARE_PROVIDER_SITE_OTHER): Payer: Medicare PPO | Admitting: *Deleted

## 2023-05-09 VITALS — BP 138/80 | HR 64 | Ht 61.0 in | Wt 143.8 lb

## 2023-05-09 DIAGNOSIS — Z0000 Encounter for general adult medical examination without abnormal findings: Secondary | ICD-10-CM

## 2023-05-09 NOTE — Patient Instructions (Signed)
Faith Webb , Thank you for taking time to come for your Medicare Wellness Visit. I appreciate your ongoing commitment to your health goals. Please review the following plan we discussed and let me know if I can assist you in the future.     This is a list of the screening recommended for you and due dates:  Health Maintenance  Topic Date Due   DTaP/Tdap/Td vaccine (1 - Tdap) Never done   Zoster (Shingles) Vaccine (1 of 2) Never done   COVID-19 Vaccine (5 - 2023-24 season) 07/27/2022   Flu Shot  06/27/2023   Medicare Annual Wellness Visit  05/08/2024   Pneumonia Vaccine  Completed   DEXA scan (bone density measurement)  Completed   HPV Vaccine  Aged Out   Hepatitis C Screening  Discontinued    Next appointment: Follow up in one year for your annual wellness visit.   Preventive Care 82 Years and Older, Female Preventive care refers to lifestyle choices and visits with your health care provider that can promote health and wellness. What does preventive care include? A yearly physical exam. This is also called an annual well check. Dental exams once or twice a year. Routine eye exams. Ask your health care provider how often you should have your eyes checked. Personal lifestyle choices, including: Daily care of your teeth and gums. Regular physical activity. Eating a healthy diet. Avoiding tobacco and drug use. Limiting alcohol use. Practicing safe sex. Taking low-dose aspirin every day. Taking vitamin and mineral supplements as recommended by your health care provider. What happens during an annual well check? The services and screenings done by your health care provider during your annual well check will depend on your age, overall health, lifestyle risk factors, and family history of disease. Counseling  Your health care provider may ask you questions about your: Alcohol use. Tobacco use. Drug use. Emotional well-being. Home and relationship well-being. Sexual  activity. Eating habits. History of falls. Memory and ability to understand (cognition). Work and work Astronomer. Reproductive health. Screening  You may have the following tests or measurements: Height, weight, and BMI. Blood pressure. Lipid and cholesterol levels. These may be checked every 5 years, or more frequently if you are over 82 years old. Skin check. Lung cancer screening. You may have this screening every year starting at age 82 if you have a 30-pack-year history of smoking and currently smoke or have quit within the past 15 years. Fecal occult blood test (FOBT) of the stool. You may have this test every year starting at age 82. Flexible sigmoidoscopy or colonoscopy. You may have a sigmoidoscopy every 5 years or a colonoscopy every 10 years starting at age 4. Hepatitis C blood test. Hepatitis B blood test. Sexually transmitted disease (STD) testing. Diabetes screening. This is done by checking your blood sugar (glucose) after you have not eaten for a while (fasting). You may have this done every 1-3 years. Bone density scan. This is done to screen for osteoporosis. You may have this done starting at age 82. Mammogram. This may be done every 1-2 years. Talk to your health care provider about how often you should have regular mammograms. Talk with your health care provider about your test results, treatment options, and if necessary, the need for more tests. Vaccines  Your health care provider may recommend certain vaccines, such as: Influenza vaccine. This is recommended every year. Tetanus, diphtheria, and acellular pertussis (Tdap, Td) vaccine. You may need a Td booster every 10 years. Zoster  vaccine. You may need this after age 82. Pneumococcal 13-valent conjugate (PCV13) vaccine. One dose is recommended after age 82. Pneumococcal polysaccharide (PPSV23) vaccine. One dose is recommended after age 82. Talk to your health care provider about which screenings and vaccines  you need and how often you need them. This information is not intended to replace advice given to you by your health care provider. Make sure you discuss any questions you have with your health care provider. Document Released: 12/09/2015 Document Revised: 08/01/2016 Document Reviewed: 09/13/2015 Elsevier Interactive Patient Education  2017 [employer].  Fall Prevention in the Home Falls can cause injuries. They can happen to people of all ages. There are many things you can do to make your home safe and to help prevent falls. What can I do on the outside of my home? Regularly fix the edges of walkways and driveways and fix any cracks. Remove anything that might make you trip as you walk through a door, such as a raised step or threshold. Trim any bushes or trees on the path to your home. Use bright outdoor lighting. Clear any walking paths of anything that might make someone trip, such as rocks or tools. Regularly check to see if handrails are loose or broken. Make sure that both sides of any steps have handrails. Any raised decks and porches should have guardrails on the edges. Have any leaves, snow, or ice cleared regularly. Use sand or salt on walking paths during winter. Clean up any spills in your garage right away. This includes oil or grease spills. What can I do in the bathroom? Use night lights. Install grab bars by the toilet and in the tub and shower. Do not use towel bars as grab bars. Use non-skid mats or decals in the tub or shower. If you need to sit down in the shower, use a plastic, non-slip stool. Keep the floor dry. Clean up any water that spills on the floor as soon as it happens. Remove soap buildup in the tub or shower regularly. Attach bath mats securely with double-sided non-slip rug tape. Do not have throw rugs and other things on the floor that can make you trip. What can I do in the bedroom? Use night lights. Make sure that you have a light by your bed that  is easy to reach. Do not use any sheets or blankets that are too big for your bed. They should not hang down onto the floor. Have a firm chair that has side arms. You can use this for support while you get dressed. Do not have throw rugs and other things on the floor that can make you trip. What can I do in the kitchen? Clean up any spills right away. Avoid walking on wet floors. Keep items that you use a lot in easy-to-reach places. If you need to reach something above you, use a strong step stool that has a grab bar. Keep electrical cords out of the way. Do not use floor polish or wax that makes floors slippery. If you must use wax, use non-skid floor wax. Do not have throw rugs and other things on the floor that can make you trip. What can I do with my stairs? Do not leave any items on the stairs. Make sure that there are handrails on both sides of the stairs and use them. Fix handrails that are broken or loose. Make sure that handrails are as long as the stairways. Check any carpeting to make sure that it  is firmly attached to the stairs. Fix any carpet that is loose or worn. Avoid having throw rugs at the top or bottom of the stairs. If you do have throw rugs, attach them to the floor with carpet tape. Make sure that you have a light switch at the top of the stairs and the bottom of the stairs. If you do not have them, ask someone to add them for you. What else can I do to help prevent falls? Wear shoes that: Do not have high heels. Have rubber bottoms. Are comfortable and fit you well. Are closed at the toe. Do not wear sandals. If you use a stepladder: Make sure that it is fully opened. Do not climb a closed stepladder. Make sure that both sides of the stepladder are locked into place. Ask someone to hold it for you, if possible. Clearly mark and make sure that you can see: Any grab bars or handrails. First and last steps. Where the edge of each step is. Use tools that help you  move around (mobility aids) if they are needed. These include: Canes. Walkers. Scooters. Crutches. Turn on the lights when you go into a dark area. Replace any light bulbs as soon as they burn out. Set up your furniture so you have a clear path. Avoid moving your furniture around. If any of your floors are uneven, fix them. If there are any pets around you, be aware of where they are. Review your medicines with your doctor. Some medicines can make you feel dizzy. This can increase your chance of falling. Ask your doctor what other things that you can do to help prevent falls. This information is not intended to replace advice given to you by your health care provider. Make sure you discuss any questions you have with your health care provider. Document Released: 09/08/2009 Document Revised: 04/19/2016 Document Reviewed: 12/17/2014 Elsevier Interactive Patient Education  2017 [employer].

## 2023-05-09 NOTE — Progress Notes (Signed)
Subjective:   Faith Webb is a 82 y.o. female who presents for Medicare Annual (Subsequent) preventive examination.  Review of Systems     Cardiac Risk Factors include: advanced age (>78men, >58 women);hypertension;dyslipidemia     Objective:    Today's Vitals   05/09/23 1102 05/09/23 1118  BP: (!) 148/73 138/80  Pulse: 66 64  Weight: 143 lb 12.8 oz (65.2 kg)   Height: 5\' 1"  (1.549 m)    Body mass index is 27.17 kg/m.     05/09/2023   11:03 AM 05/07/2022   11:53 AM 04/25/2022    9:18 AM 01/11/2022   10:57 AM 08/09/2021   12:00 PM 05/03/2021    9:45 AM 03/03/2020    3:20 PM  Advanced Directives  Does Patient Have a Medical Advance Directive? Yes Yes Yes Yes No Yes Yes  Type of Estate agent of [place];Living will Healthcare Power of [place];Out of facility DNR (pink MOST or yellow form);Living will Healthcare Power of [place];Living will Healthcare Power of [place];Living will  Living will;Healthcare Power of [employer] Power of [place];Living will  Does patient want to make changes to medical advance directive? No - Patient declined No - Patient declined  No - Patient declined     Copy of Healthcare Power of Attorney in Chart? No - copy requested No - copy requested  No - copy requested  No - copy requested   Would patient like information on creating a medical advance directive?     No - Patient declined      Current Medications (verified) Outpatient Encounter Medications as of 05/09/2023  Medication Sig   telmisartan (MICARDIS) 40 MG tablet Take 40 mg by mouth daily.   Magnesium Oxide 420 MG TABS Take 0.5 tablets by mouth daily.    meclizine (ANTIVERT) 12.5 MG tablet TAKE 1 TABLET(12.5 MG) BY MOUTH THREE TIMES DAILY AS NEEDED FOR DIZZINESS   metoprolol tartrate (LOPRESSOR) 25 MG tablet TAKE 1 TABLET(25 MG) BY MOUTH TWICE DAILY   Omega-3 Fatty Acids (THE VERY FINEST FISH OIL) LIQD Take by mouth.   potassium chloride (KLOR-CON) 10 MEQ tablet  TAKE 3 TABLETS(30 MEQ) BY MOUTH TWICE DAILY   rosuvastatin (CRESTOR) 10 MG tablet TAKE 1 TABLET(10 MG) BY MOUTH AT BEDTIME   [DISCONTINUED] Vitamin D, Ergocalciferol, (DRISDOL) 1.25 MG (50000 UNIT) CAPS capsule TAKE 1 CAPSULE BY MOUTH EVERY WEEK (Patient taking differently: 1,000 Units daily.)   No facility-administered encounter medications on file as of 05/09/2023.    Allergies (verified) Amoxicillin   History: Past Medical History:  Diagnosis Date   Allergy    Arthritis    Hyperlipidemia    Hypertension    Past Surgical History:  Procedure Laterality Date   ABDOMINAL HYSTERECTOMY     BREAST BIOPSY Bilateral    BREAST SURGERY     EYE SURGERY     Family History  Problem Relation Age of Onset   Diabetes Mother    Heart disease Mother    Hypertension Mother    Stroke Mother    Diabetes Father    Cancer Sister    Diabetes Sister    Diabetes Brother    Hyperlipidemia Brother    Stroke Brother    Diabetes Sister    Hyperlipidemia Sister    Hypertension Sister    Kidney disease Sister    Stroke Sister    Diabetes Sister    Hyperlipidemia Sister    Healthy Child    Social History   Socioeconomic  History   Marital status: Widowed    Spouse name: Not on file   Number of children: 2   Years of education: Not on file   Highest education level: Bachelor's degree (e.g., BA, AB, BS)  Occupational History   Not on file  Tobacco Use   Smoking status: Never   Smokeless tobacco: Never  Vaping Use   Vaping Use: Never used  Substance and Sexual Activity   Alcohol use: Never   Drug use: Never   Sexual activity: Not on file  Other Topics Concern   Not on file  Social History Narrative   Pt lives alone in 1 story home, she has 2 children, right handed, she drinks coffee daily, tea/ soda occasionally.   Social Determinants of Health   Financial Resource Strain: Low Risk  (05/03/2021)   Overall Financial Resource Strain (CARDIA)    Difficulty of Paying Living Expenses:  Not hard at all  Food Insecurity: No Food Insecurity (05/09/2023)   Hunger Vital Sign    Worried About Running Out of Food in the Last Year: Never true    Ran Out of Food in the Last Year: Never true  Transportation Needs: No Transportation Needs (05/09/2023)   PRAPARE - Administrator, Civil Service (Medical): No    Lack of Transportation (Non-Medical): No  Physical Activity: Sufficiently Active (05/03/2021)   Exercise Vital Sign    Days of Exercise per Week: 3 days    Minutes of Exercise per Session: 60 min  Stress: No Stress Concern Present (05/03/2021)   [employer] of Occupational Health - Occupational Stress Questionnaire    Feeling of Stress : Not at all  Social Connections: Moderately Integrated (05/03/2021)   Social Connection and Isolation Panel [NHANES]    Frequency of Communication with Friends and Family: More than three times a week    Frequency of Social Gatherings with Friends and Family: More than three times a week    Attends Religious Services: More than 4 times per year    Active Member of [employer] or Organizations: Yes    Attends Banker Meetings: More than 4 times per year    Marital Status: Widowed    Tobacco Counseling Counseling given: Not Answered   Clinical Intake:  Pain : No/denies pain  BMI - recorded: 27.17 Nutritional Status: BMI 25 -29 Overweight Nutritional Risks: None Diabetes: No  How often do you need to have someone help you when you read instructions, pamphlets, or other written materials from your doctor or pharmacy?: 1 - Never  Activities of Daily Living    05/09/2023   11:09 AM  In your present state of health, do you have any difficulty performing the following activities:  Hearing? 0  Vision? 0  Difficulty concentrating or making decisions? 0  Walking or climbing stairs? 0  Dressing or bathing? 0  Doing errands, shopping? 0  Preparing Food and eating ? N  Using the Toilet? N  In the past six months,  have you accidently leaked urine? N  Do you have problems with loss of bowel control? N  Managing your Medications? N  Managing your Finances? N  Housekeeping or managing your Housekeeping? N    Patient Care Team: Zola Button, Grayling Congress, DO as PCP - General (Family Medicine)  Indicate any recent Medical Services you may have received from other than [facility] providers in the past year (date may be approximate).     Assessment:   This is a  routine wellness examination for [facility].  Hearing/Vision screen No results found.  Dietary issues and exercise activities discussed: Current Exercise Habits: Structured exercise class, Type of exercise: Other - see comments (jazzericse, line dancing), Time (Minutes): 60, Frequency (Times/Week): 3, Weekly Exercise (Minutes/Week): 180, Intensity: Mild, Exercise limited by: orthopedic condition(s)   Goals Addressed   None    Depression Screen    05/09/2023   11:07 AM 12/13/2022   11:06 AM 05/07/2022   11:53 AM 05/03/2021    9:48 AM 02/09/2020   10:28 AM  PHQ 2/9 Scores  PHQ - 2 Score 0 0 0 0 0    Fall Risk    05/09/2023   11:05 AM 12/13/2022   11:05 AM 05/07/2022   11:53 AM 04/25/2022    9:18 AM 05/03/2021    9:47 AM  Fall Risk   Falls in the past year? 0 0 0 0 0  Number falls in past yr: 0 0 0 0 0  Injury with Fall? 0 0 0 0 0  Risk for fall due to : No Fall Risks  No Fall Risks    Follow up Falls evaluation completed Falls evaluation completed Falls evaluation completed  Falls prevention discussed    FALL RISK PREVENTION PERTAINING TO THE HOME:  Any stairs in or around the home? Yes  If so, are there any without handrails? No  Home free of loose throw rugs in walkways, pet beds, electrical cords, etc? Yes  Adequate lighting in your home to reduce risk of falls? Yes   ASSISTIVE DEVICES UTILIZED TO PREVENT FALLS:  Life alert? No  Use of a cane, walker or w/c? No  Grab bars in the bathroom? Yes  Shower chair or bench in shower? No  Elevated  toilet seat or a handicapped toilet? No   TIMED UP AND GO:  Was the test performed? Yes .  Length of time to ambulate 10 feet: 7 sec.   Gait steady and fast without use of assistive device  Cognitive Function:        05/09/2023   11:15 AM 05/07/2022   11:57 AM  6CIT Screen  What Year? 0 points 0 points  What month? 0 points 0 points  What time? 0 points 0 points  Count back from 20 0 points 0 points  Months in reverse 0 points 0 points  Repeat phrase 4 points 0 points  Total Score 4 points 0 points    Immunizations Immunization History  Administered Date(s) Administered   Fluad Quad(high Dose 65+) 08/13/2019, 08/15/2020, 10/09/2021, 09/26/2022   Influenza, High Dose Seasonal PF 09/29/2013, 09/29/2014, 09/15/2015   Influenza, Seasonal, Injecte, Preservative Fre 10/10/2012   Influenza-Unspecified 09/29/2014, 09/15/2015, 08/15/2017, 11/17/2018   PFIZER Comirnaty(Gray Top)Covid-19 Tri-Sucrose Vaccine 06/08/2021   PFIZER(Purple Top)SARS-COV-2 Vaccination 12/06/2019, 12/27/2019   Pfizer Covid-19 Vaccine Bivalent Booster 89yrs & up 11/14/2021   Pneumococcal Conjugate-13 06/12/2016   Pneumococcal Polysaccharide-23 10/10/2012, 02/11/2013    TDAP status: Due, Education has been provided regarding the importance of this vaccine. Advised may receive this vaccine at local pharmacy or Health Dept. Aware to provide a copy of the vaccination record if obtained from local pharmacy or Health Dept. Verbalized acceptance and understanding.  Flu Vaccine status: Up to date  Pneumococcal vaccine status: Up to date  Covid-19 vaccine status: Information provided on how to obtain vaccines.   Qualifies for Shingles Vaccine? Yes   Zostavax completed No   Shingrix Completed?: No.    Education has been provided regarding the importance  of this vaccine. Patient has been advised to call insurance company to determine out of pocket expense if they have not yet received this vaccine. Advised may also  receive vaccine at local pharmacy or Health Dept. Verbalized acceptance and understanding.  Screening Tests Health Maintenance  Topic Date Due   DTaP/Tdap/Td (1 - Tdap) Never done   Zoster Vaccines- Shingrix (1 of 2) Never done   COVID-19 Vaccine (5 - 2023-24 season) 07/27/2022   Medicare Annual Wellness (AWV)  05/08/2023   INFLUENZA VACCINE  06/27/2023   Pneumonia Vaccine 73+ Years old  Completed   DEXA SCAN  Completed   HPV VACCINES  Aged Out   Hepatitis C Screening  Discontinued    Health Maintenance  Health Maintenance Due  Topic Date Due   DTaP/Tdap/Td (1 - Tdap) Never done   Zoster Vaccines- Shingrix (1 of 2) Never done   COVID-19 Vaccine (5 - 2023-24 season) 07/27/2022   Medicare Annual Wellness (AWV)  05/08/2023    Colorectal cancer screening: No longer required.   Mammogram status: Completed 06/04/22. Repeat every year  Bone Density status: Completed 06/04/22. Results reflect: Bone density results: NORMAL. Repeat every 2 years.  Lung Cancer Screening: (Low Dose CT Chest recommended if Age 27-80 years, 30 pack-year currently smoking OR have quit w/in 15years.) does not qualify.    Additional Screening:  Hepatitis C Screening: does not qualify  Vision Screening: Recommended annual ophthalmology exams for early detection of glaucoma and other disorders of the eye. Is the patient up to date with their annual eye exam?  Yes  Who is the provider or what is the name of the office in which the patient attends annual eye exams? [facility]. If pt is not established with a provider, would they like to be referred to a provider to establish care? No .   Dental Screening: Recommended annual dental exams for proper oral hygiene  Community Resource Referral / Chronic Care Management: CRR required this visit?  No   CCM required this visit?  No      Plan:     I have personally reviewed and noted the following in the patient's chart:   Medical and social  history Use of alcohol, tobacco or illicit drugs  Current medications and supplements including opioid prescriptions. Patient is not currently taking opioid prescriptions. Functional ability and status Nutritional status Physical activity Advanced directives List of other physicians Hospitalizations, surgeries, and ER visits in previous 12 months Vitals Screenings to include cognitive, depression, and falls Referrals and appointments  In addition, I have reviewed and discussed with patient certain preventive protocols, quality metrics, and best practice recommendations. A written personalized care plan for preventive services as well as general preventive health recommendations were provided to patient.     Donne Anon, [place]   05/09/2023   Nurse Notes: None

## 2023-05-17 DIAGNOSIS — M1990 Unspecified osteoarthritis, unspecified site: Secondary | ICD-10-CM | POA: Diagnosis not present

## 2023-05-17 DIAGNOSIS — K219 Gastro-esophageal reflux disease without esophagitis: Secondary | ICD-10-CM | POA: Diagnosis not present

## 2023-05-17 DIAGNOSIS — N182 Chronic kidney disease, stage 2 (mild): Secondary | ICD-10-CM | POA: Diagnosis not present

## 2023-05-17 DIAGNOSIS — Z961 Presence of intraocular lens: Secondary | ICD-10-CM | POA: Diagnosis not present

## 2023-05-17 DIAGNOSIS — I129 Hypertensive chronic kidney disease with stage 1 through stage 4 chronic kidney disease, or unspecified chronic kidney disease: Secondary | ICD-10-CM | POA: Diagnosis not present

## 2023-05-17 DIAGNOSIS — H269 Unspecified cataract: Secondary | ICD-10-CM | POA: Diagnosis not present

## 2023-05-17 DIAGNOSIS — E785 Hyperlipidemia, unspecified: Secondary | ICD-10-CM | POA: Diagnosis not present

## 2023-05-17 DIAGNOSIS — E559 Vitamin D deficiency, unspecified: Secondary | ICD-10-CM | POA: Diagnosis not present

## 2023-05-17 DIAGNOSIS — R42 Dizziness and giddiness: Secondary | ICD-10-CM | POA: Diagnosis not present

## 2023-06-13 ENCOUNTER — Ambulatory Visit: Payer: Medicare PPO | Admitting: Family Medicine

## 2023-06-13 ENCOUNTER — Encounter: Payer: Self-pay | Admitting: Family Medicine

## 2023-06-13 VITALS — BP 120/80 | HR 74 | Temp 98.1°F | Resp 18 | Ht 61.0 in | Wt 145.8 lb

## 2023-06-13 DIAGNOSIS — E538 Deficiency of other specified B group vitamins: Secondary | ICD-10-CM | POA: Diagnosis not present

## 2023-06-13 DIAGNOSIS — E2839 Other primary ovarian failure: Secondary | ICD-10-CM | POA: Diagnosis not present

## 2023-06-13 DIAGNOSIS — E785 Hyperlipidemia, unspecified: Secondary | ICD-10-CM | POA: Diagnosis not present

## 2023-06-13 DIAGNOSIS — I10 Essential (primary) hypertension: Secondary | ICD-10-CM | POA: Diagnosis not present

## 2023-06-13 DIAGNOSIS — K580 Irritable bowel syndrome with diarrhea: Secondary | ICD-10-CM | POA: Diagnosis not present

## 2023-06-13 LAB — COMPREHENSIVE METABOLIC PANEL
ALT: 16 U/L (ref 0–35)
AST: 13 U/L (ref 0–37)
Albumin: 4.3 g/dL (ref 3.5–5.2)
Alkaline Phosphatase: 46 U/L (ref 39–117)
BUN: 25 mg/dL — ABNORMAL HIGH (ref 6–23)
CO2: 31 mEq/L (ref 19–32)
Calcium: 9.6 mg/dL (ref 8.4–10.5)
Chloride: 102 mEq/L (ref 96–112)
Creatinine, Ser: 1.06 mg/dL (ref 0.40–1.20)
GFR: 48.96 mL/min — ABNORMAL LOW (ref >60.00)
Glucose, Bld: 83 mg/dL (ref 70–99)
Potassium: 4 mEq/L (ref 3.5–5.1)
Sodium: 141 mEq/L (ref 135–145)
Total Bilirubin: 0.6 mg/dL (ref 0.2–1.2)
Total Protein: 6.8 g/dL (ref 6.0–8.3)

## 2023-06-13 LAB — LIPID PANEL
Cholesterol: 143 mg/dL (ref 0–200)
HDL: 66.3 mg/dL (ref >39.00)
LDL Cholesterol: 51 mg/dL (ref 0–99)
NonHDL: 76.56
Total CHOL/HDL Ratio: 2
Triglycerides: 126 mg/dL (ref 0.0–149.0)
VLDL: 25.2 mg/dL (ref 0.0–40.0)

## 2023-06-13 LAB — VITAMIN D 25 HYDROXY (VIT D DEFICIENCY, FRACTURES): VITD: 34.91 ng/mL (ref 30.00–100.00)

## 2023-06-13 LAB — VITAMIN B12: Vitamin B-12: 759 pg/mL (ref 211–911)

## 2023-06-13 MED ORDER — DICYCLOMINE HCL 10 MG PO CAPS: 10.0000 mg | ORAL_CAPSULE | Freq: Three times a day (TID) | ORAL | 0 refills | Status: DC

## 2023-06-13 NOTE — Assessment & Plan Note (Signed)
Well controlled, no changes to meds. Encouraged heart healthy diet such as the DASH diet and exercise as tolerated.  °

## 2023-06-13 NOTE — Patient Instructions (Addendum)
Diet for Irritable Bowel Syndrome When you have irritable bowel syndrome (IBS), it is very important to follow the eating habits that are best for your condition. IBS may cause various symptoms, such as pain in the abdomen, constipation, or diarrhea. Choosing the right foods can help to ease the discomfort from these symptoms. Work with your health care provider and dietitian to find the eating plan that will help to control your symptoms. What are tips for following this plan?  Keep a food diary. This will help you identify foods that cause symptoms. Write down: What you eat and when you eat it. What symptoms you have. When symptoms occur in relation to your meals, such as "pain in abdomen 2 hours after dinner." Eat your meals slowly and in a relaxed setting. Aim to eat 5-6 small meals per day. Do not skip meals. Drink enough fluid to keep your urine pale yellow. Ask your health care provider if you should take an over-the-counter probiotic to help restore healthy bacteria in your gut (digestive tract). Probiotics are foods that contain good bacteria and yeasts. Your dietitian may have specific dietary recommendations for you based on your symptoms. Your dietitian may recommend that you: Avoid foods that cause symptoms. Talk with your dietitian about other ways to get the same nutrients that are in those problem foods. Avoid foods with gluten. Gluten is a protein that is found in rye, wheat, and barley. Eat more foods that contain soluble fiber. Examples of foods with high soluble fiber include oats, seeds, and certain fruits and vegetables. Take a fiber supplement if told by your dietitian. Reduce or avoid certain foods called FODMAPs. These are foods that contain sugars that are hard for some people to digest. Ask your health care provider which foods to avoid. What foods should I avoid? The following are some foods and drinks that may make your symptoms worse: Fatty foods, such as french  fries. Foods that contain gluten, such as pasta and cereal. Dairy products, such as milk, cheese, and ice cream. Spicy foods. Alcohol. Products with caffeine, such as coffee, tea, or chocolate. Carbonated drinks, such as soda. Foods that are high in FODMAPs. These include certain fruits and vegetables. Products with sweeteners such as honey, high fructose corn syrup, sorbitol, and mannitol. The items listed above may not be a complete list of foods and beverages you should avoid. Contact a dietitian for more information. What foods are good sources of fiber? Your health care provider or dietitian may recommend that you eat more foods that contain fiber. Fiber can help to reduce constipation and other IBS symptoms. Add foods with fiber to your diet a little at a time so your body can get used to them. Too much fiber at one time might cause gas and swelling of your abdomen. The following are some foods that are good sources of fiber: Berries, such as raspberries, strawberries, and blueberries. Tomatoes. Carrots. Brown rice. Oats. Seeds, such as chia and pumpkin seeds. The items listed above may not be a complete list of recommended sources of fiber. Contact your dietitian for more options. Where to find more information International Foundation for Functional Gastrointestinal Disorders: aboutibs.org National Institute of Diabetes and Digestive and Kidney Diseases: niddk.nih.gov Summary When you have irritable bowel syndrome (IBS), it is very important to follow the eating habits that are best for your condition. IBS may cause various symptoms, such as pain in the abdomen, constipation, or diarrhea. Choosing the right foods can help to ease the   discomfort that comes from symptoms. Your health care provider or dietitian may recommend that you eat more foods that contain fiber. Keep a food diary. This will help you identify foods that cause symptoms. This information is not intended to replace  advice given to you by your health care provider. Make sure you discuss any questions you have with your health care provider. Document Revised: 10/24/2021 Document Reviewed: 10/24/2021 Elsevier Patient Education  2024 [employer].

## 2023-06-13 NOTE — Progress Notes (Signed)
Established Patient Office Visit  Subjective   Patient ID: Faith Webb, female    DOB: 1941/01/18  Age: 82 y.o. MRN: [id number]  Chief Complaint  Patient presents with   Hypertension   Hyperlipidemia   Follow-up    HPI Discussed the use of AI scribe software for clinical note transcription with the patient, who gave verbal consent to proceed.  History of Present Illness   The patient, with a history of hypertension and a cerebral aneurysm, presents with fatigue and bowel irregularity. They report feeling tired easily and have started taking vitamin B12 supplements, which they report has helped. They also report a history of a cerebral aneurysm, which was last checked a year ago and was stable at that time.  The patient also reports occasional pain in their legs, particularly when bending down and getting up. They describe the pain as occurring in both knees and it is not severe enough to warrant further intervention at this time.  The patient also reports bowel irregularity, particularly after eating sweets in the morning. This has led to frequent, urgent bowel movements that have impacted their ability to participate in activities outside of the home, such as trips with the senior center. They report abdominal cramping prior to these bowel movements.      Patient Active Problem List   Diagnosis Date Noted   Rotator cuff arthropathy of left shoulder 08/11/2021   Pes anserine bursitis 05/09/2021   Urinary frequency 12/27/2020   Hypomagnesemia 12/27/2020   Nonruptured cerebral aneurysm 01/28/2020   Subclinical hyperthyroidism 01/28/2020   Otitis externa 01/28/2020   Bilateral impacted cerumen 01/28/2020   Palpitations 05/09/2019   Dizziness 05/09/2019   Hypertension    Hyperlipidemia    Past Medical History:  Diagnosis Date   Allergy    Arthritis    Hyperlipidemia    Hypertension    Past Surgical History:  Procedure Laterality Date   ABDOMINAL HYSTERECTOMY     BREAST  BIOPSY Bilateral    BREAST SURGERY     EYE SURGERY     Social History   Tobacco Use   Smoking status: Never   Smokeless tobacco: Never  Vaping Use   Vaping status: Never Used  Substance Use Topics   Alcohol use: Never   Drug use: Never   Social History   Socioeconomic History   Marital status: Widowed    Spouse name: Not on file   Number of children: 2   Years of education: Not on file   Highest education level: Bachelor's degree (e.g., BA, AB, BS)  Occupational History   Not on file  Tobacco Use   Smoking status: Never   Smokeless tobacco: Never  Vaping Use   Vaping status: Never Used  Substance and Sexual Activity   Alcohol use: Never   Drug use: Never   Sexual activity: Not on file  Other Topics Concern   Not on file  Social History Narrative   Pt lives alone in 1 story home, she has 2 children, right handed, she drinks coffee daily, tea/ soda occasionally.   Social Determinants of Health   Financial Resource Strain: Low Risk  (05/03/2021)   Overall Financial Resource Strain (CARDIA)    Difficulty of Paying Living Expenses: Not hard at all  Food Insecurity: No Food Insecurity (05/09/2023)   Hunger Vital Sign    Worried About Running Out of Food in the Last Year: Never true    Ran Out of Food in the Last Year: Never  true  Transportation Needs: No Transportation Needs (05/09/2023)   PRAPARE - Administrator, Civil Service (Medical): No    Lack of Transportation (Non-Medical): No  Physical Activity: Sufficiently Active (05/03/2021)   Exercise Vital Sign    Days of Exercise per Week: 3 days    Minutes of Exercise per Session: 60 min  Stress: No Stress Concern Present (05/03/2021)   [employer] of Occupational Health - Occupational Stress Questionnaire    Feeling of Stress : Not at all  Social Connections: Moderately Integrated (05/03/2021)   Social Connection and Isolation Panel [NHANES]    Frequency of Communication with Friends and Family: More  than three times a week    Frequency of Social Gatherings with Friends and Family: More than three times a week    Attends Religious Services: More than 4 times per year    Active Member of [employer] or Organizations: Yes    Attends Banker Meetings: More than 4 times per year    Marital Status: Widowed  Intimate Partner Violence: Not At Risk (05/03/2021)   Humiliation, Afraid, Rape, and Kick questionnaire    Fear of Current or Ex-Partner: No    Emotionally Abused: No    Physically Abused: No    Sexually Abused: No   Family Status  Relation Name Status   Mother  (Not Specified)   Father  (Not Specified)   Sister  (Not Specified)   Brother  (Not Specified)   Sister  (Not Specified)   Sister  (Not Specified)   Child x2 Alive  No partnership data on file   Family History  Problem Relation Age of Onset   Diabetes Mother    Heart disease Mother    Hypertension Mother    Stroke Mother    Diabetes Father    Cancer Sister    Diabetes Sister    Diabetes Brother    Hyperlipidemia Brother    Stroke Brother    Diabetes Sister    Hyperlipidemia Sister    Hypertension Sister    Kidney disease Sister    Stroke Sister    Diabetes Sister    Hyperlipidemia Sister    Healthy Child    Allergies  Allergen Reactions   Amoxicillin Palpitations      Review of Systems  Constitutional:  Negative for fever and malaise/fatigue.  HENT:  Negative for congestion.   Eyes:  Negative for blurred vision.  Respiratory:  Negative for cough and shortness of breath.   Cardiovascular:  Negative for chest pain, palpitations and leg swelling.  Gastrointestinal:  Negative for vomiting.  Musculoskeletal:  Negative for back pain.  Skin:  Negative for rash.  Neurological:  Negative for loss of consciousness and headaches.      Objective:     BP 120/80 (BP Location: Left Arm, Patient Position: Sitting, Cuff Size: Normal)   Pulse 74   Temp 98.1 F (36.7 C) (Oral)   Resp 18   Ht 5\' 1"   (1.549 m)   Wt 145 lb 12.8 oz (66.1 kg)   SpO2 98%   BMI 27.55 kg/m  BP Readings from Last 3 Encounters:  06/13/23 120/80  05/09/23 138/80  12/13/22 124/82   Wt Readings from Last 3 Encounters:  06/13/23 145 lb 12.8 oz (66.1 kg)  05/09/23 143 lb 12.8 oz (65.2 kg)  12/13/22 142 lb 9.6 oz (64.7 kg)   SpO2 Readings from Last 3 Encounters:  06/13/23 98%  12/13/22 98%  06/12/22 97%  Physical Exam Vitals and nursing note reviewed.  Constitutional:      General: She is not in acute distress.    Appearance: Normal appearance. She is well-developed.  HENT:     Head: Normocephalic and atraumatic.  Eyes:     General: No scleral icterus.       Right eye: No discharge.        Left eye: No discharge.  Cardiovascular:     Rate and Rhythm: Normal rate and regular rhythm.     Heart sounds: No murmur heard. Pulmonary:     Effort: Pulmonary effort is normal. No respiratory distress.     Breath sounds: Normal breath sounds.  Musculoskeletal:        General: Normal range of motion.     Cervical back: Normal range of motion and neck supple.     Right lower leg: No edema.     Left lower leg: No edema.  Skin:    General: Skin is warm and dry.  Neurological:     Mental Status: She is alert and oriented to person, place, and time.  Psychiatric:        Mood and Affect: Mood normal.        Behavior: Behavior normal.        Thought Content: Thought content normal.        Judgment: Judgment normal.      No results found for any visits on 06/13/23.  Last CBC Lab Results  Component Value Date   WBC 4.6 05/10/2022   HGB 13.0 05/10/2022   HCT 38.9 05/10/2022   MCV 89.7 05/10/2022   MCH 30.1 08/09/2021   RDW 13.6 05/10/2022   PLT 242.0 05/10/2022   Last metabolic panel Lab Results  Component Value Date   GLUCOSE 110 (H) 05/10/2022   NA 142 05/10/2022   K 3.6 05/10/2022   CL 103 05/10/2022   CO2 30 05/10/2022   BUN 18 05/10/2022   CREATININE 1.17 05/10/2022   GFR 43.83  (L) 05/10/2022   CALCIUM 9.5 05/10/2022   PROT 6.7 05/10/2022   ALBUMIN 4.2 05/10/2022   BILITOT 0.5 05/10/2022   ALKPHOS 45 05/10/2022   AST 12 05/10/2022   ALT 14 05/10/2022   ANIONGAP 8 08/09/2021   Last lipids Lab Results  Component Value Date   CHOL 133 12/13/2022   HDL 60.20 12/13/2022   LDLCALC 47 12/13/2022   TRIG 126.0 12/13/2022   CHOLHDL 2 12/13/2022   Last hemoglobin A1c No results found for: "HGBA1C" Last thyroid functions Lab Results  Component Value Date   TSH 0.42 05/10/2022   T3TOTAL 101 06/05/2019   Last vitamin D Lab Results  Component Value Date   VD25OH 50.66 05/10/2022   Last vitamin B12 and Folate Lab Results  Component Value Date   VITAMINB12 346 05/10/2022      The ASCVD Risk score (Arnett DK, et al., 2019) failed to calculate for the following reasons:   The 2019 ASCVD risk score is only valid for ages 62 to 68    Assessment & Plan:   Problem List Items Addressed This Visit       Unprioritized   Hypertension    Well controlled, no changes to meds. Encouraged heart healthy diet such as the DASH diet and exercise as tolerated.        Relevant Medications   hydrochlorothiazide (HYDRODIURIL) 25 MG tablet   Other Relevant Orders   Comprehensive metabolic panel   Lipid panel   Hyperlipidemia -  Primary    Encourage heart healthy diet such as MIND or DASH diet, increase exercise, avoid trans fats, simple carbohydrates and processed foods, consider a krill or fish or flaxseed oil cap daily.        Relevant Medications   hydrochlorothiazide (HYDRODIURIL) 25 MG tablet   Other Relevant Orders   Comprehensive metabolic panel   Lipid panel   Other Visit Diagnoses     B12 deficiency       Relevant Orders   Vitamin B12   Estrogen deficiency       Relevant Orders   VITAMIN D 25 Hydroxy (Vit-D Deficiency, Fractures)   Irritable bowel syndrome with diarrhea       Relevant Medications   dicyclomine (BENTYL) 10 MG capsule      Assessment and Plan    Irritable Bowel Syndrome: Reports frequent bowel movements, particularly after eating sweets, leading to avoidance of trips due to fear of needing a restroom. Describes abdominal cramping prior to bowel movements. -Start a high fiber diet and consider adding a fiber supplement like Metamucil or Benefiber. -Try a probiotic. -Prescribe Lomotil to be taken three times a day with meals. -If symptoms persist, consider referral to a gastroenterologist.  Intracranial Aneurysm: Stable on last MRA in June 2023. No new symptoms reported. -Next MRA due in June 2026 as per neurologist's recommendation.  Vitamin B12 Deficiency: Reports fatigue, currently taking over-the-counter B12 supplement. -Check B12 levels today.  Hypertension: Well controlled with current regimen, recent BP 128. -Continue current antihypertensive regimen.  General Health Maintenance: -Continue monitoring blood pressure and B12 levels. -Encourage regular physical activity and balanced diet.        Return in about 6 months (around 12/14/2023), or if symptoms worsen or fail to improve, for annual exam, fasting.    [name], DO

## 2023-06-13 NOTE — Assessment & Plan Note (Signed)
Encourage heart healthy diet such as MIND or DASH diet, increase exercise, avoid trans fats, simple carbohydrates and processed foods, consider a krill or fish or flaxseed oil cap daily.  °

## 2023-06-26 DIAGNOSIS — E559 Vitamin D deficiency, unspecified: Secondary | ICD-10-CM | POA: Diagnosis not present

## 2023-06-26 DIAGNOSIS — I110 Hypertensive heart disease with heart failure: Secondary | ICD-10-CM | POA: Diagnosis not present

## 2023-06-26 DIAGNOSIS — I729 Aneurysm of unspecified site: Secondary | ICD-10-CM | POA: Diagnosis not present

## 2023-06-26 DIAGNOSIS — I509 Heart failure, unspecified: Secondary | ICD-10-CM | POA: Diagnosis not present

## 2023-06-26 DIAGNOSIS — I70209 Unspecified atherosclerosis of native arteries of extremities, unspecified extremity: Secondary | ICD-10-CM | POA: Diagnosis not present

## 2023-06-26 DIAGNOSIS — M159 Polyosteoarthritis, unspecified: Secondary | ICD-10-CM | POA: Diagnosis not present

## 2023-06-26 DIAGNOSIS — R42 Dizziness and giddiness: Secondary | ICD-10-CM | POA: Diagnosis not present

## 2023-06-26 DIAGNOSIS — R5383 Other fatigue: Secondary | ICD-10-CM | POA: Diagnosis not present

## 2023-06-26 DIAGNOSIS — E663 Overweight: Secondary | ICD-10-CM | POA: Diagnosis not present

## 2023-08-12 ENCOUNTER — Other Ambulatory Visit: Payer: Self-pay | Admitting: Family Medicine

## 2023-09-09 ENCOUNTER — Telehealth: Payer: Self-pay | Admitting: Family Medicine

## 2023-09-09 NOTE — Telephone Encounter (Signed)
Pt called stating she's been having issues getting this medication and wondered if OTC Magnesium could be substituted for this. If not, pt would like to have tis called in, when possible, as she has been out for a week. Pt would like a cal with any info, when available.

## 2023-09-10 ENCOUNTER — Other Ambulatory Visit: Payer: Self-pay | Admitting: Family Medicine

## 2023-09-10 MED ORDER — MAGNESIUM OXIDE 420 MG PO TABS: 0.5000 | ORAL_TABLET | Freq: Every day | ORAL | 2 refills | Status: AC

## 2023-09-10 NOTE — Telephone Encounter (Signed)
Noted. LVM advising of refill

## 2023-10-18 DIAGNOSIS — L821 Other seborrheic keratosis: Secondary | ICD-10-CM | POA: Diagnosis not present

## 2023-10-18 DIAGNOSIS — E663 Overweight: Secondary | ICD-10-CM | POA: Diagnosis not present

## 2023-10-18 DIAGNOSIS — E785 Hyperlipidemia, unspecified: Secondary | ICD-10-CM | POA: Diagnosis not present

## 2023-10-18 DIAGNOSIS — Z79899 Other long term (current) drug therapy: Secondary | ICD-10-CM | POA: Diagnosis not present

## 2023-10-18 DIAGNOSIS — I509 Heart failure, unspecified: Secondary | ICD-10-CM | POA: Diagnosis not present

## 2023-10-18 DIAGNOSIS — M159 Polyosteoarthritis, unspecified: Secondary | ICD-10-CM | POA: Diagnosis not present

## 2023-10-18 DIAGNOSIS — R351 Nocturia: Secondary | ICD-10-CM | POA: Diagnosis not present

## 2023-10-18 DIAGNOSIS — Z0001 Encounter for general adult medical examination with abnormal findings: Secondary | ICD-10-CM | POA: Diagnosis not present

## 2023-10-18 DIAGNOSIS — I110 Hypertensive heart disease with heart failure: Secondary | ICD-10-CM | POA: Diagnosis not present

## 2023-10-30 ENCOUNTER — Other Ambulatory Visit: Payer: Self-pay | Admitting: Family Medicine

## 2023-10-30 DIAGNOSIS — I10 Essential (primary) hypertension: Secondary | ICD-10-CM

## 2023-10-30 DIAGNOSIS — E785 Hyperlipidemia, unspecified: Secondary | ICD-10-CM

## 2023-12-17 ENCOUNTER — Encounter: Payer: Medicare PPO | Admitting: Family Medicine

## 2024-01-07 ENCOUNTER — Encounter: Payer: Medicare PPO | Admitting: Family Medicine

## 2024-02-04 ENCOUNTER — Other Ambulatory Visit: Payer: Self-pay | Admitting: Family Medicine

## 2024-02-05 ENCOUNTER — Other Ambulatory Visit: Payer: Self-pay | Admitting: Family Medicine

## 2024-02-05 DIAGNOSIS — E785 Hyperlipidemia, unspecified: Secondary | ICD-10-CM

## 2024-02-05 DIAGNOSIS — I10 Essential (primary) hypertension: Secondary | ICD-10-CM

## 2024-10-06 HISTORY — PX: TEAR DUCT PROBING: SHX793

## 2024-10-28 ENCOUNTER — Ambulatory Visit: Payer: Self-pay

## 2024-10-28 NOTE — Telephone Encounter (Signed)
 noted

## 2024-10-28 NOTE — Telephone Encounter (Signed)
 FYI Only or Action Required?: FYI only for provider: appointment scheduled on 12/9.  Patient was last seen in primary care on 06/13/2023 by Antonio Meth, Jamee SAUNDERS, DO.  Called Nurse Triage reporting Fatigue.  Symptoms began a week ago.  Interventions attempted: Rest, hydration, or home remedies.  Symptoms are: gradually worsening.  Triage Disposition: See PCP When Office is Open (Within 3 Days)  Patient/caregiver understands and will follow disposition?: Yes  Copied from CRM [id number]. Topic: Clinical - Red Word Triage >> Oct 28, 2024  1:58 PM Eva FALCON wrote: Red Word that prompted transfer to Nurse Triage: feeling very fatigue and feels like legs are giving out. Reason for Disposition  [1] Fatigue (i.e., tires easily, decreased energy) AND [2] persists > 1 week  Answer Assessment - Initial Assessment Questions Increased Fatigue and generalized weakness for about a week. Recent eye surgery and has since completed Doxycycline, using eye drops and used hydrocodone post op only once or twice.   Not as hydrated as she could, doesn't ever sleep great but at baseline,  Meclizine  for Vertigo at baseline.   BP at OV a few days ago-142/82 Aneurysm in her head they are watching with repeat MRI in 2026- denies headaches or vision changes  Was with [facility] for about a year and wanting to come back to Dr Antonio- LOV 05/2023- Acute visit scheduled for next week. In the interim ED/UC precautions advised. Advised to reach out to [facility] to see if they want to adjust BP meds.  1. DESCRIPTION: Describe how you are feeling.     Worn out, fatigue  2. SEVERITY: How bad is it?  Can you stand and walk?     Legs feel very weak- denies falls  3. ONSET: When did these symptoms begin? (e.g., hours, days, weeks, months)     About a week  4. CAUSE: What do you think is causing the weakness or fatigue? (e.g., not drinking enough fluids, medical problem, trouble sleeping)     Unsure if  5. NEW  MEDICINES:  Have you started on any new medicines recently? (e.g., opioid pain medicines, benzodiazepines, muscle relaxants, antidepressants, antihistamines, neuroleptics, beta blockers)     Doxycycline (completed), eye drops, 2 doses of hydrocodone  6. OTHER SYMPTOMS: Do you have any other symptoms? (e.g., chest pain, fever, cough, SOB, vomiting, diarrhea, bleeding, other areas of pain)     Denies  Protocols used: Weakness (Generalized) and Fatigue-A-AH

## 2024-11-03 ENCOUNTER — Encounter: Payer: Self-pay | Admitting: Family Medicine

## 2024-11-03 ENCOUNTER — Ambulatory Visit: Admitting: Family Medicine

## 2024-11-03 VITALS — BP 122/70 | HR 72 | Temp 97.8°F | Resp 16 | Ht 61.0 in | Wt 143.6 lb

## 2024-11-03 DIAGNOSIS — R5383 Other fatigue: Secondary | ICD-10-CM

## 2024-11-03 DIAGNOSIS — E785 Hyperlipidemia, unspecified: Secondary | ICD-10-CM

## 2024-11-03 DIAGNOSIS — Z8249 Family history of ischemic heart disease and other diseases of the circulatory system: Secondary | ICD-10-CM

## 2024-11-03 DIAGNOSIS — E538 Deficiency of other specified B group vitamins: Secondary | ICD-10-CM

## 2024-11-03 DIAGNOSIS — I10 Essential (primary) hypertension: Secondary | ICD-10-CM

## 2024-11-03 MED ORDER — POTASSIUM CHLORIDE ER 10 MEQ PO TBCR: EXTENDED_RELEASE_TABLET | ORAL | 1 refills | Status: AC

## 2024-11-03 NOTE — Progress Notes (Unsigned)
 Subjective:    Patient ID: Faith Webb, female    DOB: Nov 11, 1941, 83 y.o.   MRN: [id number]  Chief Complaint  Patient presents with   Fatigue    X2 weeks ago    HPI Patient is in today for ***  Past Medical History:  Diagnosis Date   Allergy    Arthritis    Hyperlipidemia    Hypertension     Past Surgical History:  Procedure Laterality Date   ABDOMINAL HYSTERECTOMY     BREAST BIOPSY Bilateral    BREAST SURGERY     EYE SURGERY      Family History  Problem Relation Age of Onset   Diabetes Mother    Heart disease Mother    Hypertension Mother    Stroke Mother    Diabetes Father    Cancer Sister    Diabetes Sister    Diabetes Brother    Hyperlipidemia Brother    Stroke Brother    Diabetes Sister    Hyperlipidemia Sister    Hypertension Sister    Kidney disease Sister    Stroke Sister    Diabetes Sister    Hyperlipidemia Sister    Healthy Child     Social History   Socioeconomic History   Marital status: Widowed    Spouse name: Not on file   Number of children: 2   Years of education: Not on file   Highest education level: Bachelor's degree (e.g., BA, AB, BS)  Occupational History   Not on file  Tobacco Use   Smoking status: Never   Smokeless tobacco: Never  Vaping Use   Vaping status: Never Used  Substance and Sexual Activity   Alcohol use: Never   Drug use: Never   Sexual activity: Not on file  Other Topics Concern   Not on file  Social History Narrative   Pt lives alone in 1 story home, she has 2 children, right handed, she drinks coffee daily, tea/ soda occasionally.   Social Drivers of Corporate Investment Banker Strain: Low Risk  (05/03/2021)   Overall Financial Resource Strain (CARDIA)    Difficulty of Paying Living Expenses: Not hard at all  Food Insecurity: No Food Insecurity (05/09/2023)   Hunger Vital Sign    Worried About Running Out of Food in the Last Year: Never true    Ran Out of Food in the Last Year: Never true   Transportation Needs: No Transportation Needs (05/09/2023)   PRAPARE - Administrator, Civil Service (Medical): No    Lack of Transportation (Non-Medical): No  Physical Activity: Sufficiently Active (05/03/2021)   Exercise Vital Sign    Days of Exercise per Week: 3 days    Minutes of Exercise per Session: 60 min  Stress: No Stress Concern Present (05/03/2021)   [employer] of Occupational Health - Occupational Stress Questionnaire    Feeling of Stress : Not at all  Social Connections: Moderately Integrated (05/03/2021)   Social Connection and Isolation Panel    Frequency of Communication with Friends and Family: More than three times a week    Frequency of Social Gatherings with Friends and Family: More than three times a week    Attends Religious Services: More than 4 times per year    Active Member of [employer] or Organizations: Yes    Attends Banker Meetings: More than 4 times per year    Marital Status: Widowed  Intimate Partner Violence: Not At Risk (05/03/2021)  Humiliation, Afraid, Rape, and Kick questionnaire    Fear of Current or Ex-Partner: No    Emotionally Abused: No    Physically Abused: No    Sexually Abused: No    Outpatient Medications Prior to Visit  Medication Sig Dispense Refill   hydrochlorothiazide  (HYDRODIURIL ) 25 MG tablet Take 25 mg by mouth daily.     Magnesium  Oxide 420 MG TABS Take 0.5 tablets (210 mg total) by mouth daily. 30 tablet 2   meclizine  (ANTIVERT ) 12.5 MG tablet TAKE 1 TABLET(12.5 MG) BY MOUTH THREE TIMES DAILY AS NEEDED FOR DIZZINESS 30 tablet 0   metoprolol  tartrate (LOPRESSOR ) 25 MG tablet TAKE 1 TABLET(25 MG) BY MOUTH TWICE DAILY 180 tablet 1   Omega-3 Fatty Acids (THE VERY FINEST FISH OIL) LIQD Take by mouth.     potassium chloride  (KLOR-CON ) 10 MEQ tablet TAKE 3 TABLETS(30 MEQ) BY MOUTH TWICE DAILY 270 tablet 1   rosuvastatin  (CRESTOR ) 10 MG tablet Take 1 tablet (10 mg total) by mouth at bedtime. 90 tablet 1    telmisartan (MICARDIS) 40 MG tablet Take 40 mg by mouth daily.     dicyclomine  (BENTYL ) 10 MG capsule Take 1 capsule (10 mg total) by mouth 3 (three) times daily before meals. 30 capsule 0   No facility-administered medications prior to visit.    Allergies  Allergen Reactions   Amoxicillin Palpitations    ROS     Objective:    Physical Exam  BP 122/70 (BP Location: Right Arm, Patient Position: Sitting, Cuff Size: Normal)   Pulse 72   Temp 97.8 F (36.6 C) (Oral)   Resp 16   Ht 5' 1 (1.549 m)   Wt 143 lb 9.6 oz (65.1 kg)   SpO2 99%   BMI 27.13 kg/m  Wt Readings from Last 3 Encounters:  11/03/24 143 lb 9.6 oz (65.1 kg)  06/13/23 145 lb 12.8 oz (66.1 kg)  05/09/23 143 lb 12.8 oz (65.2 kg)    Diabetic Foot Exam - Simple   No data filed    Lab Results  Component Value Date   WBC 4.6 05/10/2022   HGB 13.0 05/10/2022   HCT 38.9 05/10/2022   PLT 242.0 05/10/2022   GLUCOSE 83 06/13/2023   CHOL 143 06/13/2023   TRIG 126.0 06/13/2023   HDL 66.30 06/13/2023   LDLCALC 51 06/13/2023   ALT 16 06/13/2023   AST 13 06/13/2023   NA 141 06/13/2023   K 4.0 06/13/2023   CL 102 06/13/2023   CREATININE 1.06 06/13/2023   BUN 25 (H) 06/13/2023   CO2 31 06/13/2023   TSH 0.42 05/10/2022    Lab Results  Component Value Date   TSH 0.42 05/10/2022   Lab Results  Component Value Date   WBC 4.6 05/10/2022   HGB 13.0 05/10/2022   HCT 38.9 05/10/2022   MCV 89.7 05/10/2022   PLT 242.0 05/10/2022   Lab Results  Component Value Date   NA 141 06/13/2023   K 4.0 06/13/2023   CO2 31 06/13/2023   GLUCOSE 83 06/13/2023   BUN 25 (H) 06/13/2023   CREATININE 1.06 06/13/2023   BILITOT 0.6 06/13/2023   ALKPHOS 46 06/13/2023   AST 13 06/13/2023   ALT 16 06/13/2023   PROT 6.8 06/13/2023   ALBUMIN 4.3 06/13/2023   CALCIUM  9.6 06/13/2023   ANIONGAP 8 08/09/2021   GFR 48.96 (L) 06/13/2023   Lab Results  Component Value Date   CHOL 143 06/13/2023   Lab Results  Component  Value Date   HDL 66.30 06/13/2023   Lab Results  Component Value Date   LDLCALC 51 06/13/2023   Lab Results  Component Value Date   TRIG 126.0 06/13/2023   Lab Results  Component Value Date   CHOLHDL 2 06/13/2023   No results found for: HGBA1C     Assessment & Plan:  There are no diagnoses linked to this encounter.  Mckinzey Entwistle R Lowne Chase, DO

## 2024-11-04 LAB — CBC WITH DIFFERENTIAL/PLATELET
Basophils Absolute: 0 K/uL (ref 0.0–0.1)
Basophils Relative: 0.9 % (ref 0.0–3.0)
Eosinophils Absolute: 0.1 K/uL (ref 0.0–0.7)
Eosinophils Relative: 2.5 % (ref 0.0–5.0)
HCT: 36.1 % (ref 36.0–46.0)
Hemoglobin: 12.4 g/dL (ref 12.0–15.0)
Lymphocytes Relative: 27.1 % (ref 12.0–46.0)
Lymphs Abs: 1.4 K/uL (ref 0.7–4.0)
MCHC: 34.3 g/dL (ref 30.0–36.0)
MCV: 89.7 fl (ref 78.0–100.0)
Monocytes Absolute: 0.6 K/uL (ref 0.1–1.0)
Monocytes Relative: 11.6 % (ref 3.0–12.0)
Neutro Abs: 2.9 K/uL (ref 1.4–7.7)
Neutrophils Relative %: 57.9 % (ref 43.0–77.0)
Platelets: 229 K/uL (ref 150.0–400.0)
RBC: 4.03 Mil/uL (ref 3.87–5.11)
RDW: 13.8 % (ref 11.5–15.5)
WBC: 5 K/uL (ref 4.0–10.5)

## 2024-11-04 LAB — LIPID PANEL
Cholesterol: 133 mg/dL (ref 0–200)
HDL: 72.7 mg/dL (ref >39.00)
LDL Cholesterol: 42 mg/dL (ref 0–99)
NonHDL: 60.36
Total CHOL/HDL Ratio: 2
Triglycerides: 91 mg/dL (ref 0.0–149.0)
VLDL: 18.2 mg/dL (ref 0.0–40.0)

## 2024-11-04 LAB — COMPREHENSIVE METABOLIC PANEL WITH GFR
ALT: 16 U/L (ref 0–35)
AST: 12 U/L (ref 0–37)
Albumin: 4.5 g/dL (ref 3.5–5.2)
Alkaline Phosphatase: 47 U/L (ref 39–117)
BUN: 21 mg/dL (ref 6–23)
CO2: 29 meq/L (ref 19–32)
Calcium: 9.6 mg/dL (ref 8.4–10.5)
Chloride: 103 meq/L (ref 96–112)
Creatinine, Ser: 1.24 mg/dL — ABNORMAL HIGH (ref 0.40–1.20)
GFR: 40.17 mL/min — ABNORMAL LOW (ref >60.00)
Glucose, Bld: 79 mg/dL (ref 70–99)
Potassium: 3.8 meq/L (ref 3.5–5.1)
Sodium: 141 meq/L (ref 135–145)
Total Bilirubin: 0.5 mg/dL (ref 0.2–1.2)
Total Protein: 6.7 g/dL (ref 6.0–8.3)

## 2024-11-04 LAB — TSH: TSH: 0.54 u[IU]/mL (ref 0.35–5.50)

## 2024-11-04 LAB — VITAMIN B12: Vitamin B-12: 450 pg/mL (ref 211–911)

## 2024-11-04 LAB — VITAMIN D 25 HYDROXY (VIT D DEFICIENCY, FRACTURES): VITD: 35.13 ng/mL (ref 30.00–100.00)

## 2024-11-05 ENCOUNTER — Ambulatory Visit: Payer: Self-pay | Admitting: Family Medicine

## 2024-11-12 ENCOUNTER — Ambulatory Visit (HOSPITAL_BASED_OUTPATIENT_CLINIC_OR_DEPARTMENT_OTHER): Admission: RE | Admit: 2024-11-12 | Discharge: 2024-11-12 | Attending: Family Medicine | Admitting: Family Medicine

## 2024-11-12 DIAGNOSIS — Z136 Encounter for screening for cardiovascular disorders: Secondary | ICD-10-CM | POA: Insufficient documentation

## 2024-11-12 DIAGNOSIS — Z8249 Family history of ischemic heart disease and other diseases of the circulatory system: Secondary | ICD-10-CM | POA: Diagnosis present

## 2025-05-04 ENCOUNTER — Ambulatory Visit: Admitting: Family Medicine
